# Patient Record
Sex: Female | Born: 1950 | Race: White | Hispanic: No | Smoking: Former smoker
Health system: Southern US, Community
[De-identification: ages and names within clinical notes are randomized; demographics above are authoritative.]

## PROBLEM LIST (undated history)

## (undated) DIAGNOSIS — R0981 Nasal congestion: Secondary | ICD-10-CM

## (undated) DIAGNOSIS — R000 Tachycardia, unspecified: Secondary | ICD-10-CM

## (undated) DIAGNOSIS — E669 Obesity, unspecified: Secondary | ICD-10-CM

## (undated) DIAGNOSIS — M5126 Other intervertebral disc displacement, lumbar region: Secondary | ICD-10-CM

## (undated) DIAGNOSIS — E785 Hyperlipidemia, unspecified: Secondary | ICD-10-CM

## (undated) DIAGNOSIS — IMO0001 Reserved for inherently not codable concepts without codable children: Secondary | ICD-10-CM

## (undated) DIAGNOSIS — T7840XA Allergy, unspecified, initial encounter: Secondary | ICD-10-CM

## (undated) DIAGNOSIS — T8859XA Other complications of anesthesia, initial encounter: Secondary | ICD-10-CM

## (undated) DIAGNOSIS — H269 Unspecified cataract: Secondary | ICD-10-CM

## (undated) DIAGNOSIS — C50411 Malignant neoplasm of upper-outer quadrant of right female breast: Secondary | ICD-10-CM

## (undated) DIAGNOSIS — J349 Unspecified disorder of nose and nasal sinuses: Secondary | ICD-10-CM

## (undated) DIAGNOSIS — K5900 Constipation, unspecified: Secondary | ICD-10-CM

## (undated) DIAGNOSIS — M1990 Unspecified osteoarthritis, unspecified site: Secondary | ICD-10-CM

## (undated) DIAGNOSIS — G4730 Sleep apnea, unspecified: Secondary | ICD-10-CM

## (undated) DIAGNOSIS — T4145XA Adverse effect of unspecified anesthetic, initial encounter: Secondary | ICD-10-CM

## (undated) DIAGNOSIS — R7303 Prediabetes: Secondary | ICD-10-CM

## (undated) DIAGNOSIS — M810 Age-related osteoporosis without current pathological fracture: Secondary | ICD-10-CM

## (undated) DIAGNOSIS — E0500 Thyrotoxicosis with diffuse goiter without thyrotoxic crisis or storm: Secondary | ICD-10-CM

## (undated) DIAGNOSIS — K635 Polyp of colon: Secondary | ICD-10-CM

## (undated) HISTORY — DX: Hyperlipidemia, unspecified: E78.5

## (undated) HISTORY — DX: Polyp of colon: K63.5

## (undated) HISTORY — DX: Morbid (severe) obesity due to excess calories: E66.01

## (undated) HISTORY — DX: Prediabetes: R73.03

## (undated) HISTORY — DX: Unspecified osteoarthritis, unspecified site: M19.90

## (undated) HISTORY — DX: Sleep apnea, unspecified: G47.30

## (undated) HISTORY — DX: Obesity, unspecified: E66.9

## (undated) HISTORY — DX: Nasal congestion: R09.81

## (undated) HISTORY — PX: BREAST LUMPECTOMY: SHX2

## (undated) HISTORY — DX: Malignant neoplasm of upper-outer quadrant of right female breast: C50.411

## (undated) HISTORY — PX: BREAST BIOPSY: SHX20

## (undated) HISTORY — DX: Allergy, unspecified, initial encounter: T78.40XA

## (undated) HISTORY — DX: Age-related osteoporosis without current pathological fracture: M81.0

---

## 1982-12-14 HISTORY — PX: NASAL SEPTUM SURGERY: SHX37

## 1988-12-14 HISTORY — PX: ABDOMINAL HYSTERECTOMY: SHX81

## 1999-06-29 ENCOUNTER — Emergency Department (HOSPITAL_COMMUNITY): Admission: EM | Admit: 1999-06-29 | Discharge: 1999-06-29 | Payer: Self-pay

## 2000-01-02 ENCOUNTER — Other Ambulatory Visit: Admission: RE | Admit: 2000-01-02 | Discharge: 2000-01-02 | Payer: Self-pay | Admitting: Internal Medicine

## 2000-01-02 ENCOUNTER — Encounter (INDEPENDENT_AMBULATORY_CARE_PROVIDER_SITE_OTHER): Payer: Self-pay | Admitting: Specialist

## 2000-08-30 ENCOUNTER — Encounter: Payer: Self-pay | Admitting: Internal Medicine

## 2000-08-30 ENCOUNTER — Ambulatory Visit (HOSPITAL_COMMUNITY): Admission: RE | Admit: 2000-08-30 | Discharge: 2000-08-30 | Payer: Self-pay | Admitting: Internal Medicine

## 2000-11-02 ENCOUNTER — Other Ambulatory Visit: Admission: RE | Admit: 2000-11-02 | Discharge: 2000-11-02 | Payer: Self-pay | Admitting: Obstetrics & Gynecology

## 2001-11-22 ENCOUNTER — Other Ambulatory Visit: Admission: RE | Admit: 2001-11-22 | Discharge: 2001-11-22 | Payer: Self-pay | Admitting: Obstetrics & Gynecology

## 2002-03-17 ENCOUNTER — Encounter: Admission: RE | Admit: 2002-03-17 | Discharge: 2002-03-17 | Payer: Self-pay | Admitting: Oral Surgery

## 2002-03-17 ENCOUNTER — Encounter: Payer: Self-pay | Admitting: Specialist

## 2002-04-07 ENCOUNTER — Emergency Department (HOSPITAL_COMMUNITY): Admission: EM | Admit: 2002-04-07 | Discharge: 2002-04-07 | Payer: Self-pay | Admitting: Emergency Medicine

## 2002-07-27 ENCOUNTER — Encounter: Admission: RE | Admit: 2002-07-27 | Discharge: 2002-10-25 | Payer: Self-pay

## 2002-08-24 ENCOUNTER — Encounter: Payer: Self-pay | Admitting: Internal Medicine

## 2002-12-19 ENCOUNTER — Other Ambulatory Visit: Admission: RE | Admit: 2002-12-19 | Discharge: 2002-12-19 | Payer: Self-pay | Admitting: Obstetrics & Gynecology

## 2005-07-03 ENCOUNTER — Ambulatory Visit: Payer: Self-pay | Admitting: Internal Medicine

## 2005-10-02 ENCOUNTER — Other Ambulatory Visit: Admission: RE | Admit: 2005-10-02 | Discharge: 2005-10-02 | Payer: Self-pay | Admitting: Obstetrics & Gynecology

## 2005-10-28 ENCOUNTER — Encounter: Admission: RE | Admit: 2005-10-28 | Discharge: 2005-10-28 | Payer: Self-pay | Admitting: Obstetrics & Gynecology

## 2007-01-20 ENCOUNTER — Ambulatory Visit: Payer: Self-pay | Admitting: Internal Medicine

## 2007-01-25 ENCOUNTER — Ambulatory Visit (HOSPITAL_COMMUNITY): Admission: RE | Admit: 2007-01-25 | Discharge: 2007-01-25 | Payer: Self-pay | Admitting: Internal Medicine

## 2007-01-25 ENCOUNTER — Encounter: Payer: Self-pay | Admitting: Internal Medicine

## 2007-09-01 ENCOUNTER — Encounter (INDEPENDENT_AMBULATORY_CARE_PROVIDER_SITE_OTHER): Payer: Self-pay | Admitting: *Deleted

## 2007-09-01 ENCOUNTER — Encounter (INDEPENDENT_AMBULATORY_CARE_PROVIDER_SITE_OTHER): Payer: Self-pay | Admitting: Obstetrics & Gynecology

## 2007-09-01 ENCOUNTER — Ambulatory Visit (HOSPITAL_COMMUNITY): Admission: RE | Admit: 2007-09-01 | Discharge: 2007-09-02 | Payer: Self-pay | Admitting: Specialist

## 2007-09-01 HISTORY — PX: RECTOCELE REPAIR: SHX761

## 2007-09-02 ENCOUNTER — Encounter (INDEPENDENT_AMBULATORY_CARE_PROVIDER_SITE_OTHER): Payer: Self-pay | Admitting: *Deleted

## 2009-01-20 ENCOUNTER — Emergency Department (HOSPITAL_BASED_OUTPATIENT_CLINIC_OR_DEPARTMENT_OTHER): Admission: EM | Admit: 2009-01-20 | Discharge: 2009-01-20 | Payer: Self-pay | Admitting: Emergency Medicine

## 2009-01-31 ENCOUNTER — Ambulatory Visit: Payer: Self-pay | Admitting: Diagnostic Radiology

## 2009-01-31 ENCOUNTER — Ambulatory Visit (HOSPITAL_BASED_OUTPATIENT_CLINIC_OR_DEPARTMENT_OTHER): Admission: RE | Admit: 2009-01-31 | Discharge: 2009-01-31 | Payer: Self-pay | Admitting: Orthopedic Surgery

## 2009-08-02 ENCOUNTER — Encounter (INDEPENDENT_AMBULATORY_CARE_PROVIDER_SITE_OTHER): Payer: Self-pay | Admitting: *Deleted

## 2009-08-30 DIAGNOSIS — K219 Gastro-esophageal reflux disease without esophagitis: Secondary | ICD-10-CM

## 2009-10-29 DIAGNOSIS — Z860100 Personal history of colon polyps, unspecified: Secondary | ICD-10-CM | POA: Insufficient documentation

## 2009-10-29 DIAGNOSIS — E785 Hyperlipidemia, unspecified: Secondary | ICD-10-CM

## 2009-10-29 DIAGNOSIS — K589 Irritable bowel syndrome without diarrhea: Secondary | ICD-10-CM

## 2009-10-29 DIAGNOSIS — K5909 Other constipation: Secondary | ICD-10-CM

## 2009-10-29 DIAGNOSIS — K649 Unspecified hemorrhoids: Secondary | ICD-10-CM | POA: Insufficient documentation

## 2009-12-16 ENCOUNTER — Telehealth: Payer: Self-pay | Admitting: Internal Medicine

## 2009-12-16 ENCOUNTER — Ambulatory Visit: Payer: Self-pay | Admitting: Internal Medicine

## 2009-12-17 ENCOUNTER — Ambulatory Visit: Payer: Self-pay | Admitting: Internal Medicine

## 2009-12-17 LAB — HM COLONOSCOPY

## 2009-12-19 ENCOUNTER — Encounter: Payer: Self-pay | Admitting: Internal Medicine

## 2010-01-14 LAB — HM MAMMOGRAPHY: HM Mammogram: NORMAL

## 2010-11-20 ENCOUNTER — Encounter: Payer: Self-pay | Admitting: Internal Medicine

## 2010-11-20 ENCOUNTER — Ambulatory Visit: Payer: Self-pay | Admitting: Internal Medicine

## 2010-11-20 DIAGNOSIS — R609 Edema, unspecified: Secondary | ICD-10-CM

## 2010-11-20 DIAGNOSIS — I739 Peripheral vascular disease, unspecified: Secondary | ICD-10-CM | POA: Insufficient documentation

## 2010-11-20 DIAGNOSIS — R0989 Other specified symptoms and signs involving the circulatory and respiratory systems: Secondary | ICD-10-CM

## 2010-11-20 DIAGNOSIS — IMO0001 Reserved for inherently not codable concepts without codable children: Secondary | ICD-10-CM

## 2010-11-20 DIAGNOSIS — R0609 Other forms of dyspnea: Secondary | ICD-10-CM

## 2010-11-20 DIAGNOSIS — F17200 Nicotine dependence, unspecified, uncomplicated: Secondary | ICD-10-CM

## 2010-11-20 DIAGNOSIS — R209 Unspecified disturbances of skin sensation: Secondary | ICD-10-CM | POA: Insufficient documentation

## 2010-11-20 DIAGNOSIS — R9431 Abnormal electrocardiogram [ECG] [EKG]: Secondary | ICD-10-CM

## 2010-11-20 LAB — CONVERTED CEMR LAB
ALT: 19 units/L (ref 0–35)
AST: 20 units/L (ref 0–37)
Albumin: 4 g/dL (ref 3.5–5.2)
Alkaline Phosphatase: 71 units/L (ref 39–117)
Basophils Absolute: 0 10*3/uL (ref 0.0–0.1)
Bilirubin, Direct: 0.1 mg/dL (ref 0.0–0.3)
CO2: 30 meq/L (ref 19–32)
Calcium: 9.7 mg/dL (ref 8.4–10.5)
Chloride: 102 meq/L (ref 96–112)
Cholesterol: 211 mg/dL — ABNORMAL HIGH (ref 0–200)
Direct LDL: 131.6 mg/dL
Eosinophils Absolute: 0.2 10*3/uL (ref 0.0–0.7)
Glucose, Bld: 97 mg/dL (ref 70–99)
Hgb A1c MFr Bld: 6 % (ref 4.6–6.5)
Lymphocytes Relative: 31.8 % (ref 12.0–46.0)
MCHC: 34 g/dL (ref 30.0–36.0)
MCV: 92 fL (ref 78.0–100.0)
Monocytes Absolute: 0.4 10*3/uL (ref 0.1–1.0)
Neutrophils Relative %: 59.4 % (ref 43.0–77.0)
Platelets: 200 10*3/uL (ref 150.0–400.0)
Potassium: 5.1 meq/L (ref 3.5–5.1)
RDW: 14.8 % — ABNORMAL HIGH (ref 11.5–14.6)
Sed Rate: 22 mm/hr (ref 0–22)
Sodium: 139 meq/L (ref 135–145)
Total CHOL/HDL Ratio: 5
Total Protein: 7.1 g/dL (ref 6.0–8.3)
VLDL: 39.6 mg/dL (ref 0.0–40.0)
Vitamin B-12: 273 pg/mL (ref 211–911)

## 2010-11-21 ENCOUNTER — Encounter: Payer: Self-pay | Admitting: Internal Medicine

## 2010-11-25 ENCOUNTER — Ambulatory Visit: Payer: Self-pay | Admitting: Internal Medicine

## 2010-12-03 ENCOUNTER — Encounter: Payer: Self-pay | Admitting: Internal Medicine

## 2010-12-03 ENCOUNTER — Ambulatory Visit: Payer: Self-pay | Admitting: Cardiology

## 2010-12-05 ENCOUNTER — Ambulatory Visit: Payer: Self-pay

## 2010-12-11 ENCOUNTER — Ambulatory Visit: Payer: Self-pay | Admitting: Pulmonary Disease

## 2010-12-12 ENCOUNTER — Encounter: Payer: Self-pay | Admitting: Internal Medicine

## 2010-12-17 ENCOUNTER — Other Ambulatory Visit: Payer: Self-pay | Admitting: Cardiology

## 2010-12-17 ENCOUNTER — Ambulatory Visit (HOSPITAL_COMMUNITY)
Admission: RE | Admit: 2010-12-17 | Discharge: 2010-12-17 | Payer: Self-pay | Source: Home / Self Care | Attending: Cardiology | Admitting: Cardiology

## 2010-12-17 ENCOUNTER — Encounter: Payer: Self-pay | Admitting: Internal Medicine

## 2010-12-22 ENCOUNTER — Encounter: Payer: Self-pay | Admitting: Cardiology

## 2010-12-25 ENCOUNTER — Ambulatory Visit: Admit: 2010-12-25 | Payer: Self-pay | Admitting: Internal Medicine

## 2010-12-26 ENCOUNTER — Ambulatory Visit (HOSPITAL_COMMUNITY)
Admission: RE | Admit: 2010-12-26 | Discharge: 2010-12-26 | Payer: Self-pay | Source: Home / Self Care | Attending: Cardiology | Admitting: Cardiology

## 2011-01-02 ENCOUNTER — Ambulatory Visit
Admission: RE | Admit: 2011-01-02 | Discharge: 2011-01-02 | Payer: Self-pay | Source: Home / Self Care | Attending: Internal Medicine | Admitting: Internal Medicine

## 2011-01-02 DIAGNOSIS — E538 Deficiency of other specified B group vitamins: Secondary | ICD-10-CM | POA: Insufficient documentation

## 2011-01-02 DIAGNOSIS — M545 Low back pain: Secondary | ICD-10-CM | POA: Insufficient documentation

## 2011-01-07 ENCOUNTER — Ambulatory Visit: Admit: 2011-01-07 | Payer: Self-pay | Admitting: Cardiology

## 2011-01-13 NOTE — Letter (Signed)
Summary: Lipid Letter  [facility]  [address] [place], [place] [address]   Phone: [phone]  Fax: [phone]    11/21/2010  Sophia Kelley [address] [place], [place]  [address]  Dear Jasmine December:  We have carefully reviewed your last lipid profile from  and the results are noted below with a summary of recommendations for lipid management.    Cholesterol:       211     Goal: <200   HDL "good" Cholesterol:   00.86     Goal: >50   LDL "bad" Cholesterol:   132     Goal: <130   Triglycerides:       198.0     Goal: <150        TLC Diet (Therapeutic Lifestyle Change): Saturated Fats & Transfatty acids should be kept < 7% of total calories ***Reduce Saturated Fats Polyunstaurated Fat can be up to 10% of total calories Monounsaturated Fat Fat can be up to 20% of total calories Total Fat should be no greater than 25-35% of total calories Carbohydrates should be 50-60% of total calories Protein should be approximately 15% of total calories Fiber should be at least 20-30 grams a day ***Increased fiber may help lower LDL Total Cholesterol should be < 200mg /day Consider adding plant stanol/sterols to diet (example: Benacol spread) ***A higher intake of unsaturated fat may reduce Triglycerides and Increase HDL    Adjunctive Measures (may lower LIPIDS and reduce risk of Heart Attack) include: Aerobic Exercise (20-30 minutes 3-4 times a week) Limit Alcohol Consumption Weight Reduction Aspirin 75-81 mg a day by mouth (if not allergic or contraindicated) Dietary Fiber 20-30 grams a day by mouth     Current Medications:  None If you have any questions, please call. We appreciate being able to work with you.   Sincerely,    [facility] Etta Grandchild MD

## 2011-01-13 NOTE — Letter (Signed)
Summary: Patient Notice- Colon Biospy Results  [facility]  [address] [place], [place] [address]   Phone: [phone]  Fax: [phone]        December 19, 2009 MRN: [id number]    Sophia Kelley [address] [place], [place]  [address]    Dear [name],  I am pleased to inform you that the biopsies taken during your recent colonoscopy did not show any evidence of cancer upon pathologic examination.The polyp is hyperplastic ( not precancerous)  Additional information/recommendations:  x__No further action is needed at this time.  Please follow-up with      your primary care physician for your other healthcare needs.  __Please call [phone] to schedule a return visit to review      your condition.  __Continue with the treatment plan as outlined on the day of your      exam.  _x_You should have a repeat colonoscopy examination for this problem           in 7_ years.  Please call us if you are having persistent problems or have questions about your condition that have not been fully answered at this time.  Sincerely,  Hart Carwin MD   This letter has been electronically signed by your physician.  Appended Document: Patient Notice- Colon Biospy Results Letter mailed 01.12.11

## 2011-01-13 NOTE — Assessment & Plan Note (Signed)
Summary: rectal bleeding, constipation....em   History of Present Illness Visit Type: follow up  Primary GI MD: Lina Sar MD Primary Provider: Freddy Finner, MD  Requesting Provider: n/a Chief Complaint: Constipation and consult colon History of Present Illness:   This is a 60 year old white female with severe constipation of many years duration. She had repair of a rectocele in September 2008 with only temporary improvement. Her last colonoscopy in 2003 showed a hyperplastic polyp. A prior colonoscopy in 2001 showed an adenomatous polyp. Patient continues to have severe constipation. Without mineral oil, her bowel movements occur only once a week. With mineral oil, she has a bowel movement every other day. There has been no rectal bleeding; her Sitzmarks study in 2008 was negative.   GI Review of Systems      Denies abdominal pain, acid reflux, belching, bloating, chest pain, dysphagia with liquids, dysphagia with solids, heartburn, loss of appetite, nausea, vomiting, vomiting blood, weight loss, and  weight gain.      Reports constipation.     Denies anal fissure, black tarry stools, change in bowel habit, diarrhea, diverticulosis, fecal incontinence, heme positive stool, hemorrhoids, irritable bowel syndrome, jaundice, light color stool, liver problems, rectal bleeding, and  rectal pain.    Current Medications (verified): 1)  Mineral Oil  Oil (Mineral Oil) .... 4th of A Cup Every Week  Allergies (verified): No Known Drug Allergies  Past History:  Past Medical History: Reviewed history from 10/29/2009 and no changes required. Current Problems:  HYPERLIPIDEMIA (ICD-272.4) COLONIC POLYPS, ADENOMATOUS, HX OF (ICD-V12.72) IRRITABLE BOWEL SYNDROME (ICD-564.1) CONSTIPATION, CHRONIC (ICD-564.09) HEMORRHOIDS (ICD-455.6) * COLONIC INERTIA    Past Surgical History: Reviewed history from 10/29/2009 and no changes required. C-Section x 2 Nasal Septal  Surgery Hysterectomy  Family History: Family History of Heart Disease: Father Family History of Prostate Cancer: Brother No FH of Colon Cancer:  Social History: Occupation: Unemployed Patient currently smokes: social  Alcohol Use - no Illicit Drug Use - no Daily Caffeine Use: coffee once daily  Smoking Status:  current  Review of Systems  The patient denies allergy/sinus, anemia, anxiety-new, arthritis/joint pain, back pain, blood in urine, breast changes/lumps, change in vision, confusion, cough, coughing up blood, depression-new, fainting, fatigue, fever, headaches-new, hearing problems, heart murmur, heart rhythm changes, itching, menstrual pain, muscle pains/cramps, night sweats, nosebleeds, pregnancy symptoms, shortness of breath, skin rash, sleeping problems, sore throat, swelling of feet/legs, swollen lymph glands, thirst - excessive , urination - excessive , urination changes/pain, urine leakage, vision changes, and voice change.         Pertinent positive and negative review of systems were noted in the above HPI. All other ROS was otherwise negative.   Vital Signs:  Patient profile:   60 year old female Height:      66 inches Weight:      268 pounds BMI:     43.41 BSA:     2.27 Pulse rate:   64 / minute Pulse rhythm:   regular BP sitting:   126 / 80  (left arm) Cuff size:   regular  Vitals Entered By: Ok Anis CMA (December 16, 2009 10:03 AM)  Physical Exam  General:  obese, alert and oriented. Eyes:  PERRLA, no icterus. Mouth:  No deformity or lesions, dentition normal. Neck:  Supple; no masses or thyromegaly. Lungs:  Clear throughout to auscultation. Heart:  Regular rate and rhythm; no murmurs, rubs,  or bruits. Abdomen:  obese abdomen without tenderness, normoactive bowel sounds. Rectal:  large  amount of soft Hemoccult negative stool. Psych:  Alert and cooperative. Normal mood and affect.   Impression & Recommendations:  Problem # 1:  COLONIC POLYPS,  ADENOMATOUS, HX OF (ICD-V12.72)  Patient has a history of an adenomatous polyp in 2001 and a hyperplastic polyp in 2003. She is due for a repeat colonoscopy at this time.  Orders: Colonoscopy (Colon)  Problem # 2:  CONSTIPATION, CHRONIC (ICD-564.09)  Patient has severe constipation due to colonic inertia. She has a regimen of mineral oil every 2-3 days which seems to be working. I encouraged her to continue the same regimen. She can also use milk of magnesia or dulcolax tablets p.r.n. She needs to stay active and continue a high-fiber diet as well.  Orders: Colonoscopy (Colon)  Patient Instructions: 1)  high-fiber diet. 2)  Increased activity. 3)  Scheduled colonoscopy for followup of colon polyps. 4)  Mineral oil 2-3 tablespoons every 2 days, she  may use dulcolax or milk of magnesia as well. 5)  Copy sent to : Dr Konrad Dolores Prescriptions: DULCOLAX 5 MG  TBEC (BISACODYL) Day before procedure take 2 at 3pm and 2 at 8pm.  #4 x 0   Entered by:   Hortense Ramal CMA (AAMA)   Authorized by:   Hart Carwin MD   Signed by:   Hortense Ramal CMA (AAMA) on 12/16/2009   Method used:   Electronically to        [facility] # [phone]* (retail)       [address]       [place], [place]  [address]       Ph: [phone]       Fax: [phone]   RxID:   [id number] REGLAN 10 MG  TABS (METOCLOPRAMIDE HCL) As per prep instructions.  #2 x 0   Entered by:   Hortense Ramal CMA (AAMA)   Authorized by:   Hart Carwin MD   Signed by:   Hortense Ramal CMA (AAMA) on 12/16/2009   Method used:   Electronically to        [facility] # [phone]* (retail)       [address]       [place], [place]  [address]       Ph: [phone]       Fax: [phone]   RxID:   [id number] MIRALAX   POWD (POLYETHYLENE GLYCOL 3350) As per prep  instructions.  #255gm x 0   Entered by:   Hortense Ramal CMA (AAMA)   Authorized by:   Hart Carwin MD   Signed by:    Hortense Ramal CMA (AAMA) on 12/16/2009   Method used:   Electronically to        [facility] # [phone]* (retail)       [address]       [place], [place]  [address]       Ph: [phone]       Fax: [phone]   RxID:   [id number]

## 2011-01-13 NOTE — Assessment & Plan Note (Signed)
Summary: new bcbs pt--stc   Vital Signs:  Patient profile:   60 year old female Menstrual status:  hysterectomy Height:      66 inches Weight:      279 pounds BMI:     45.19 O2 Sat:      96 % on Room air Temp:     97.8 degrees F oral Pulse rate:   88 / minute Pulse rhythm:   regular Resp:     16 per minute BP sitting:   118 / 82  (left arm) Cuff size:   regular  Vitals Entered By: Rock Nephew CMA (November 20, 2010 1:29 PM)  Nutrition Counseling: Patient's BMI is greater than 25 and therefore counseled on weight management options.  O2 Flow:  Room air  Primary Care Provider:  Etta Grandchild MD   History of Present Illness: New to me she complains of severe/worsening pain in her anterior shins and feet for several years that has worsened over the last few months. She feels tingling/burning/weakness/incoordination in her feet as well. Her symptoms occur mostly at rest and the pain resolves when she walks around. She tells me that she has been seeing Dr. Ezzard Standing (ENT) about choking spells and Dr. Jennette Kettle her Gyn told her that she had a circulatory problem.  Preventive Screening-Counseling & Management  Alcohol-Tobacco     Alcohol drinks/day: 0     Alcohol Counseling: not indicated; patient does not drink     Smoking Status: current     Smoking Cessation Counseling: yes     Packs/Day: 0.5     Year Started: 1974     Pack years: 25     Tobacco Counseling: to quit use of tobacco products  Hep-HIV-STD-Contraception     Hepatitis Risk: no risk noted     HIV Risk: no risk noted     STD Risk: no risk noted      Sexual History:  currently monogamous.        Drug Use:  never.        Blood Transfusions:  no.    Current Medications (verified): 1)  None  Allergies (verified): No Known Drug Allergies  Past History:  Past Medical History: Last updated: 10/29/2009 Current Problems:  HYPERLIPIDEMIA (ICD-272.4) COLONIC POLYPS, ADENOMATOUS, HX OF (ICD-V12.72) IRRITABLE  BOWEL SYNDROME (ICD-564.1) CONSTIPATION, CHRONIC (ICD-564.09) HEMORRHOIDS (ICD-455.6) * COLONIC INERTIA    Past Surgical History: Last updated: 10/29/2009 C-Section x 2 Nasal Septal Surgery Hysterectomy  Family History: Last updated: 12/16/2009 Family History of Heart Disease: Father Family History of Prostate Cancer: Brother No FH of Colon Cancer:  Social History: Last updated: 11/20/2010 Occupation: Unemployed Patient currently smokes: social  Alcohol Use - no Illicit Drug Use - no Daily Caffeine Use: coffee once daily  Married  Family History: Reviewed history from 12/16/2009 and no changes required. Family History of Heart Disease: Father Family History of Prostate Cancer: Brother No FH of Colon Cancer:  Social History: Reviewed history from 12/16/2009 and no changes required. Occupation: Unemployed Patient currently smokes: social  Alcohol Use - no Illicit Drug Use - no Daily Caffeine Use: coffee once daily  Married Packs/Day:  0.5 Hepatitis Risk:  no risk noted HIV Risk:  no risk noted STD Risk:  no risk noted Sexual History:  currently monogamous Drug Use:  never Blood Transfusions:  no  Review of Systems       The patient complains of weight gain, dyspnea on exertion, peripheral edema, prolonged cough, muscle weakness, and difficulty  walking.  The patient denies anorexia, fever, weight loss, chest pain, syncope, headaches, hemoptysis, abdominal pain, melena, hematochezia, severe indigestion/heartburn, hematuria, incontinence, suspicious skin lesions, abnormal bleeding, enlarged lymph nodes, and breast masses.   CV:  Complains of difficulty breathing at night, difficulty breathing while lying down, swelling of feet, and weight gain; denies chest pain or discomfort, fainting, fatigue, leg cramps with exertion, lightheadness, near fainting, palpitations, shortness of breath with exertion, and swelling of hands. Resp:  Complains of cough, excessive snoring,  hypersomnolence, and morning headaches; denies chest discomfort, chest pain with inspiration, coughing up blood, pleuritic, shortness of breath, sputum productive, and wheezing. GI:  Complains of constipation; denies abdominal pain, bloody stools, dark tarry stools, diarrhea, hemorrhoids, indigestion, loss of appetite, nausea, vomiting, vomiting blood, and yellowish skin color. MS:  Complains of loss of strength, muscle aches, and muscle weakness; denies joint pain, joint redness, joint swelling, low back pain, mid back pain, cramps, stiffness, and thoracic pain. Psych:  Complains of anxiety and easily tearful; denies alternate hallucination ( auditory/visual), depression, easily angered, irritability, mental problems, panic attacks, sense of great danger, suicidal thoughts/plans, thoughts of violence, unusual visions or sounds, and thoughts /plans of harming others. Endo:  Complains of weight change; denies cold intolerance, excessive hunger, excessive thirst, excessive urination, heat intolerance, and polyuria.  Physical Exam  General:  alert, well-developed, well-nourished, well-hydrated, and overweight-appearing.   Head:  normocephalic, atraumatic, no abnormalities observed, and no abnormalities palpated.   Eyes:  vision grossly intact, pupils equal, pupils round, and pupils reactive to light.   Ears:  R ear normal and L ear normal.   Mouth:  Oral mucosa and oropharynx without lesions or exudates.  Teeth in good repair. Neck:  supple, full ROM, no masses, no thyromegaly, no thyroid nodules or tenderness, no JVD, no HJR, normal carotid upstroke, no carotid bruits, no cervical lymphadenopathy, and no neck tenderness.   Lungs:  normal respiratory effort, no intercostal retractions, no accessory muscle use, normal breath sounds, no dullness, no fremitus, no crackles, and no wheezes.   Heart:  normal rate, regular rhythm, no murmur, no gallop, no rub, and no JVD.   Abdomen:  soft, non-tender, normal  bowel sounds, no distention, no masses, no guarding, no rigidity, no rebound tenderness, no abdominal hernia, no inguinal hernia, no hepatomegaly, and no splenomegaly.   Msk:  normal ROM, no joint tenderness, no joint swelling, no joint warmth, no redness over joints, no joint deformities, no joint instability, and no crepitation.   Pulses:  R radial normal, L radial normal, R femoral decreased, R popliteal decreased, R posterior tibial decreased, R dorsalis pedis decreased, L femoral decreased, L popliteal decreased, L posterior tibial decreased, and L dorsalis pedis decreased.   Extremities:  trace left pedal edema and trace right pedal edema. she has very faint erythema and diffuse ttp in the bilateral anterior lower leg area. She has good capillary refill in the distal toes.  Neurologic:  alert & oriented X3, cranial nerves II-XII intact, sensation intact to light touch, heel-to-shin normal, toes down bilaterally on Babinski, Romberg negative, ataxic gait, RLE hyperreflexia, RLE weakness, LLE hyperreflexia, and LLE weakness.   Skin:  turgor normal, color normal, no rashes, no suspicious lesions, no ecchymoses, no petechiae, no purpura, no ulcerations, and no edema.   Cervical Nodes:  no anterior cervical adenopathy and no posterior cervical adenopathy.   Axillary Nodes:  no R axillary adenopathy and no L axillary adenopathy.   Inguinal Nodes:  no R inguinal adenopathy.  Psych:  Oriented X3, memory intact for recent and remote, normally interactive, good eye contact, not depressed appearing, not agitated, not suicidal, not homicidal, and moderately anxious.     Impression & Recommendations:  Problem # 1:  ABNORMAL ELECTROCARDIOGRAM (ICD-794.31) Assessment New  Orders: Cardiology Referral (Cardiology) EKG w/ Interpretation (93000)  Problem # 2:  TOBACCO USE (ICD-305.1) Assessment: New  Encouraged smoking cessation and discussed different methods for smoking cessation.   Problem # 3:   PARESTHESIA (ICD-782.0) Assessment: New will check NCS and EMG to look for neuropathy and myopathy Orders: Venipuncture (16109) TLB-Lipid Panel (80061-LIPID) TLB-B12 + Folate Pnl (60454_09811-B14/NWG) Neurology Referral (Neuro)  Problem # 4:  PVD (ICD-443.9) Assessment: New  Orders: Venipuncture (95621) TLB-Lipid Panel (80061-LIPID) TLB-B12 + Folate Pnl (30865_78469-G29/BMW) LE Arterial Doppler/ABI (Le arterial doppler)  Problem # 5:  UNSPECIFIED MYALGIA AND MYOSITIS (ICD-729.1) Assessment: New will screen for inflammatory process Orders: Venipuncture (41324) TLB-BMP (Basic Metabolic Panel-BMET) (80048-METABOL) TLB-Hepatic/Liver Function Pnl (80076-HEPATIC) TLB-TSH (Thyroid Stimulating Hormone) (84443-TSH) TLB-CBC Platelet - w/Differential (85025-CBCD) TLB-CK Total Only(Creatine Kinase/CPK) (82550-CK) TLB-A1C / Hgb A1C (Glycohemoglobin) (83036-A1C) TLB-Sedimentation Rate (ESR) (85652-ESR) TLB-CRP-High Sensitivity (C-Reactive Protein) (86140-FCRP) TLB-BNP (B-Natriuretic Peptide) (83880-BNPR) Venipuncture (40102) TLB-Lipid Panel (80061-LIPID) TLB-B12 + Folate Pnl (72536_64403-K74/QVZ)  Problem # 6:  COUGH (ICD-786.2) Assessment: New will look for edema, masses, etc Orders: Venipuncture (56387) TLB-BMP (Basic Metabolic Panel-BMET) (80048-METABOL) TLB-Hepatic/Liver Function Pnl (80076-HEPATIC) TLB-TSH (Thyroid Stimulating Hormone) (84443-TSH) TLB-CBC Platelet - w/Differential (85025-CBCD) TLB-CK Total Only(Creatine Kinase/CPK) (82550-CK) TLB-A1C / Hgb A1C (Glycohemoglobin) (83036-A1C) TLB-Sedimentation Rate (ESR) (85652-ESR) TLB-CRP-High Sensitivity (C-Reactive Protein) (86140-FCRP) TLB-BNP (B-Natriuretic Peptide) (83880-BNPR) T-2 View CXR (71020TC)  Problem # 7:  DYSPNEA/SHORTNESS OF BREATH (ICD-786.09) Assessment: New I think this is probably a conditioning problem but will look further into cardiac and pulmonary issues Orders: Venipuncture (56433) TLB-BMP  (Basic Metabolic Panel-BMET) (80048-METABOL) TLB-Hepatic/Liver Function Pnl (80076-HEPATIC) TLB-TSH (Thyroid Stimulating Hormone) (84443-TSH) TLB-CBC Platelet - w/Differential (85025-CBCD) TLB-CK Total Only(Creatine Kinase/CPK) (82550-CK) TLB-A1C / Hgb A1C (Glycohemoglobin) (83036-A1C) TLB-Sedimentation Rate (ESR) (85652-ESR) TLB-CRP-High Sensitivity (C-Reactive Protein) (86140-FCRP) TLB-BNP (B-Natriuretic Peptide) (83880-BNPR) EKG w/ Interpretation (93000)  Problem # 8:  SLEEP APNEA (ICD-780.57) Assessment: New  Orders: Venipuncture (29518) TLB-BMP (Basic Metabolic Panel-BMET) (80048-METABOL) TLB-Hepatic/Liver Function Pnl (80076-HEPATIC) TLB-TSH (Thyroid Stimulating Hormone) (84443-TSH) TLB-CBC Platelet - w/Differential (85025-CBCD) TLB-CK Total Only(Creatine Kinase/CPK) (82550-CK) TLB-A1C / Hgb A1C (Glycohemoglobin) (83036-A1C) TLB-Sedimentation Rate (ESR) (85652-ESR) TLB-CRP-High Sensitivity (C-Reactive Protein) (86140-FCRP) TLB-BNP (B-Natriuretic Peptide) (83880-BNPR) Sleep Disorder Referral (Sleep Disorder)  Problem # 9:  HYPERLIPIDEMIA (ICD-272.4) Assessment: Unchanged  Patient Instructions: 1)  Please schedule a follow-up appointment in 1 month. 2)  Tobacco is very bad for your health and your loved ones! You Should stop smoking!. 3)  Stop Smoking Tips: Choose a Quit date. Cut down before the Quit date. decide what you will do as a substitute when you feel the urge to smoke(gum,toothpick,exercise). 4)  It is important that you exercise regularly at least 20 minutes 5 times a week. If you develop chest pain, have severe difficulty breathing, or feel very tired , stop exercising immediately and seek medical attention. 5)  You need to lose weight. Consider a lower calorie diet and regular exercise.    Orders Added: 1)  Venipuncture [36415] 2)  TLB-BMP (Basic Metabolic Panel-BMET) [80048-METABOL] 3)  TLB-Hepatic/Liver Function Pnl [80076-HEPATIC] 4)  TLB-TSH (Thyroid  Stimulating Hormone) [84443-TSH] 5)  TLB-CBC Platelet - w/Differential [85025-CBCD] 6)  TLB-CK Total Only(Creatine Kinase/CPK) [82550-CK] 7)  TLB-A1C / Hgb A1C (Glycohemoglobin) [83036-A1C] 8)  TLB-Sedimentation Rate (ESR) [85652-ESR] 9)  TLB-CRP-High Sensitivity (  C-Reactive Protein) [86140-FCRP] 10)  TLB-BNP (B-Natriuretic Peptide) [83880-BNPR] 11)  T-2 View CXR [71020TC] 12)  Venipuncture [36415] 13)  TLB-Lipid Panel [80061-LIPID] 14)  TLB-B12 + Folate Pnl [82746_82607-B12/FOL] 15)  LE Arterial Doppler/ABI [Le arterial doppler] 16)  Neurology Referral [Neuro] 17)  Sleep Disorder Referral [Sleep Disorder] 18)  Cardiology Referral [Cardiology] 19)  EKG w/ Interpretation [93000] 20)  New Patient Level V [99205]    Preventive Care Screening  Mammogram:    Date:  01/14/2010    Results:  normal   Pap Smear:    Date:  01/14/2010    Results:  normal

## 2011-01-13 NOTE — Letter (Signed)
Summary: [facility] Instructions  [facility]  [address] [place], [place] [address]   Phone: [phone]  Fax: [phone]       Sophia Kelley    10-31-1951    MRN: [id number]       Procedure Day /Date: 12/17/09 (Tuesday)     Arrival Time: 8:30 am     Procedure Time: 9:30 am     Location of Procedure:                    _ x_  [facility] (4th Floor)  PREPARATION FOR COLONOSCOPY WITH MIRALAX   THE DAY BEFORE YOUR PROCEDURE         DATE: 12/16/09 DAY: Monday  1   Drink clear liquids the entire day-NO SOLID FOOD  2   Do not drink anything colored red or purple.  Avoid juices with pulp.  No orange juice.  3   Drink at least 64 oz. (8 glasses) of fluid/clear liquids during the day to prevent dehydration and help the prep work efficiently.  CLEAR LIQUIDS INCLUDE: Water Jello Ice Popsicles Tea (sugar ok, no milk/cream) Powdered fruit flavored drinks Coffee (sugar ok, no milk/cream) Gatorade Juice: apple, white grape, white cranberry  Lemonade Clear bullion, consomm, broth Carbonated beverages (any kind) Strained chicken noodle soup Hard Candy  4   Mix the entire bottle of Miralax with 64 oz. of Gatorade/Powerade in the morning and put in the refrigerator to chill.  5   At 3:00 pm take 2 Dulcolax/Bisacodyl tablets.  6   At 4:30 pm take one Reglan/Metoclopramide tablet.  7  Starting at 5:00 pm drink one 8 oz glass of the Miralax mixture every 15-20 minutes until you have finished drinking the entire 64 oz.  You should finish drinking prep around 7:30 or 8:00 pm.  8   If you are nauseated, you may take the 2nd Reglan/Metoclopramide tablet at 6:30 pm.        9    At 8:00 pm take 2 more DULCOLAX/Bisacodyl tablets.            THE DAY OF YOUR PROCEDURE      DATE:  12/17/09 DAY: Tuesday  You may drink clear liquids until 7:30 am  (2 HOURS BEFORE PROCEDURE).   MEDICATION INSTRUCTIONS  Unless otherwise instructed, you should take regular  prescription medications with a small sip of water as early as possible the morning of your procedure.         OTHER INSTRUCTIONS  You will need a responsible adult at least 60 years of age to accompany you and drive you home.   This person must remain in the waiting room during your procedure.  Wear loose fitting clothing that is easily removed.  Leave jewelry and other valuables at home.  However, you may wish to bring a book to read or an iPod/MP3 player to listen to music as you wait for your procedure to start.  Remove all body piercing jewelry and leave at home.  Total time from sign-in until discharge is approximately 2-3 hours.  You should go home directly after your procedure and rest.  You can resume normal activities the day after your procedure.  The day of your procedure you should not:   Drive   Make legal decisions   Operate machinery   Drink alcohol   Return to work  You will receive specific instructions about eating, activities and medications before you leave.   The  above instructions have been reviewed and explained to me by   Hortense Ramal, CMA    I fully understand and can verbalize these instructions _____________________________ Date 12/16/09

## 2011-01-13 NOTE — Procedures (Signed)
Summary: Colonoscopy  Patient: [name] Note: All result statuses are Final unless otherwise noted.  Tests: (1) Colonoscopy (COL)   COL Colonoscopy           DONE     [facility]     [address]     [place], [place]  [address]           COLONOSCOPY PROCEDURE REPORT           PATIENT:  Sophia Kelley, Sophia Kelley  MR#:  [id number]     BIRTHDATE:  [birth year], 58 yrs. old  GENDER:  female           ENDOSCOPIST:  Hedwig Morton. Juanda Chance, MD     Referred by:  Varney Baas, M.D.           PROCEDURE DATE:  12/17/2009     PROCEDURE:  Colonoscopy 19147     ASA CLASS:  Class II     INDICATIONS:  constipation adenom. polyp 2001,     hyperplastic polyp in 2003           Sitz mark study negative           MEDICATIONS:   Versed 8 mg, Fentanyl 75 mcg           DESCRIPTION OF PROCEDURE:   After the risks benefits and     alternatives of the procedure were thoroughly explained, informed     consent was obtained.  Digital rectal exam was performed and     revealed no rectal masses.   The LB [id number] endoscope     was introduced through the anus and advanced to the cecum, which     was identified by the ileocecal valve, unable to vis appendiceal     opening  The quality of the prep was good, using MiraLax.  The     instrument was then slowly withdrawn as the colon was fully     examined.     <<PROCEDUREIMAGES>>           FINDINGS:  There were multiple polyps identified and removed. in     the sigmoid colon. about 7-8 diminutive polyps ablated with hot     biopsies at 20cm With hot biopsy forceps, biopsy was obtained and     sent to pathology (see image3, image4, and image5).  This was     otherwise a normal examination of the colon (see image6 and     image2).   Retroflexed views in the rectum revealed no     abnormalities.    The scope was then withdrawn from the patient     and the procedure completed.           COMPLICATIONS:  None           ENDOSCOPIC IMPRESSION:     1)  Polyps, multiple in the sigmoid colon     2) Otherwise normal examination     s/p multiple hot biopsy polypectomies at 20 cm     RECOMMENDATIONS:     1) Await pathology results     2) high fiber diet     Mineral oil 2-3 tablespoons daily           REPEAT EXAM:  In 7 year(s) for.           ______________________________     Hedwig Morton. Juanda Chance, MD           CC:  n.     eSIGNED:   Hedwig Morton. Danyele Smejkal at 12/17/2009 10:25 AM           [name], [phone]  Note: An exclamation mark (!) indicates a result that was not dispersed into the flowsheet. Document Creation Date: 12/17/2009 10:24 AM _______________________________________________________________________  (1) Order result status: Final Collection or observation date-time: 12/17/2009 10:14 Requested date-time:  Receipt date-time:  Reported date-time:  Referring Physician:   Ordering Physician: Lina Sar [phone]) Specimen Source:  Source: Launa Grill Order Number: [id number] Lab site:   Appended Document: Colonoscopy     Procedures Next Due Date:    Colonoscopy: 12/2016

## 2011-01-13 NOTE — Progress Notes (Signed)
Summary: vomiting after colonoscopy prep  Phone Note Call from Patient   Caller: Spouse Complaint: Headache Summary of Call: She had vomiting several times after finishing MiraLax prep. Has had bowel movements. No abdominal pain, no chest pain or respiratory difficulty. she does feel weak and was light-headed. advised to take sips of liquids to hydrate and call back or go to ED if syncope, persistent vomiting, other problems. Husband told that she may need additional cleansing with enemas in AM and that if stools not clear to arrive early at [facility] and explain to staff. Initial call taken by: Iva Boop MD, Clementeen Graham,  December 16, 2009 8:54 PM

## 2011-01-14 ENCOUNTER — Other Ambulatory Visit: Payer: Self-pay

## 2011-01-14 ENCOUNTER — Ambulatory Visit: Admit: 2011-01-14 | Payer: Self-pay | Admitting: Cardiology

## 2011-01-15 NOTE — Assessment & Plan Note (Signed)
Summary: f/u appt / #/ cd   Vital Signs:  Patient profile:   60 year old female Menstrual status:  hysterectomy Height:      66 inches Weight:      282 pounds BMI:     45.68 O2 Sat:      98 % on Room air Temp:     98.4 degrees F oral Pulse rate:   80 / minute Pulse rhythm:   regular Resp:     16 per minute BP sitting:   110 / 84  (left arm) Cuff size:   large  Vitals Entered By: Lanier Prude, CMA(AAMA) (January 02, 2011 1:12 PM)  Nutrition Counseling: Patient's BMI is greater than 25 and therefore counseled on weight management options.  O2 Flow:  Room air CC: f/u  Is Patient Diabetic? No   Primary Care Provider:  Etta Grandchild MD  CC:  f/u .  History of Present Illness: She returns for f/up and tells me that Dr. Anne Hahn of neurology did her NCS and EMG and it was normal but she told him her symptoms and that she has had some LBP and he told her that he thought the leg pain was coming from her low back. She has tingling in her legs and thinks that she has RLS and she tells me that she will have a sleep study done soon. She tells me that her legs feel twitchy. Her ABI's are normal but her leg and back pain is worsened by activity, she bends over to relieve her low back pain. Her TEE showed some lipomatous changes in the atrium and concentric LVH. Her labs revealed a borderline low B12 level.  Preventive Screening-Counseling & Management  Alcohol-Tobacco     Alcohol drinks/day: 0     Alcohol Counseling: not indicated; patient does not drink     Smoking Status: current     Smoking Cessation Counseling: yes     Packs/Day: 0.5     Year Started: 1974     Pack years: 25     Tobacco Counseling: to quit use of tobacco products  Hep-HIV-STD-Contraception     Hepatitis Risk: no risk noted     HIV Risk: no risk noted     STD Risk: no risk noted      Sexual History:  currently monogamous.        Drug Use:  never.        Blood Transfusions:  no.    Clinical Review  Panels:  Prevention   Last Mammogram:  normal (01/14/2010)   Last Pap Smear:  normal (01/14/2010)   Last Colonoscopy:  DONE (12/17/2009)  Immunizations   Last Flu Vaccine:  Fluvax 3+ (11/24/2010)  Lipid Management   Cholesterol:  211 (11/20/2010)   HDL (good cholesterol):  45.90 (11/20/2010)  Diabetes Management   HgBA1C:  6.0 (11/20/2010)   Creatinine:  0.8 (11/20/2010)   Last Flu Vaccine:  Fluvax 3+ (11/24/2010)  CBC   WBC:  7.6 (11/20/2010)   RBC:  4.81 (11/20/2010)   Hgb:  15.1 (11/20/2010)   Hct:  44.2 (11/20/2010)   Platelets:  200.0 (11/20/2010)   MCV  92.0 (11/20/2010)   MCHC  34.0 (11/20/2010)   RDW  14.8 (11/20/2010)   PMN:  59.4 (11/20/2010)   Lymphs:  31.8 (11/20/2010)   Monos:  5.8 (11/20/2010)   Eosinophils:  2.5 (11/20/2010)   Basophil:  0.5 (11/20/2010)  Complete Metabolic Panel   Glucose:  97 (11/20/2010)   Sodium:  139 (  11/20/2010)   Potassium:  5.1 (11/20/2010)   Chloride:  102 (11/20/2010)   CO2:  30 (11/20/2010)   BUN:  10 (11/20/2010)   Creatinine:  0.8 (11/20/2010)   Albumin:  4.0 (11/20/2010)   Total Protein:  7.1 (11/20/2010)   Calcium:  9.7 (11/20/2010)   Total Bili:  0.4 (11/20/2010)   Alk Phos:  71 (11/20/2010)   SGPT (ALT):  19 (11/20/2010)   SGOT (AST):  20 (11/20/2010)   Current Medications (verified): 1)  Aspirin 81 Mg Tbec (Aspirin) .... One Daily 2)  Pravachol 40 Mg Tabs (Pravastatin Sodium) .... One Daily in The Evening  Allergies (verified): No Known Drug Allergies  Past History:  Past Medical History: Last updated: 12/03/2010 1. HYPERLIPIDEMIA (ICD-272.4) 2. COLONIC POLYPS, ADENOMATOUS, HX OF (ICD-V12.72) 3. IRRITABLE BOWEL SYNDROME (ICD-564.1) 4. HEMORRHOIDS (ICD-455.6) 5. COLONIC INERTIA 6. Active smoker 7. OSA (suspected) 8. Obesity  Past Surgical History: Last updated: 10/29/2009 C-Section x 2 Nasal Septal Surgery Hysterectomy  Family History: Last updated: 12/03/2010 Family History of Heart  Disease: Father with MI at 39 Family History of Prostate Cancer: Brother No FH of Colon Cancer:  Social History: Last updated: 12/03/2010 Occupation: Unemployed Patient currently smokes: < 1/2 ppd Alcohol Use - no Illicit Drug Use - no Daily Caffeine Use: coffee once daily  Married  Risk Factors: Smoking Status: current (01/02/2011) Packs/Day: 0.5 (01/02/2011)  Family History: Reviewed history from 12/03/2010 and no changes required. Family History of Heart Disease: Father with MI at 12 Family History of Prostate Cancer: Brother No FH of Colon Cancer:  Social History: Reviewed history from 12/03/2010 and no changes required. Occupation: Unemployed Patient currently smokes: < 1/2 ppd Alcohol Use - no Illicit Drug Use - no Daily Caffeine Use: coffee once daily  Married  Review of Systems       The patient complains of weight gain and difficulty walking.  The patient denies anorexia, fever, weight loss, chest pain, syncope, dyspnea on exertion, peripheral edema, prolonged cough, headaches, hemoptysis, abdominal pain, hematuria, muscle weakness, suspicious skin lesions, transient blindness, depression, enlarged lymph nodes, and angioedema.    Physical Exam  General:  alert, well-developed, well-nourished, well-hydrated, and overweight-appearing.   Head:  normocephalic, atraumatic, no abnormalities observed, and no abnormalities palpated.   Mouth:  Oral mucosa and oropharynx without lesions or exudates.  Teeth in good repair. Neck:  supple, full ROM, no masses, no thyromegaly, no thyroid nodules or tenderness, no JVD, no HJR, normal carotid upstroke, no carotid bruits, no cervical lymphadenopathy, and no neck tenderness.   Lungs:  normal respiratory effort, no intercostal retractions, no accessory muscle use, normal breath sounds, no dullness, no fremitus, no crackles, and no wheezes.   Heart:  normal rate, regular rhythm, no murmur, no gallop, no rub, and no JVD.   Abdomen:   soft, non-tender, normal bowel sounds, no distention, no masses, no guarding, no rigidity, no rebound tenderness, no abdominal hernia, no inguinal hernia, no hepatomegaly, and no splenomegaly.   Msk:  normal ROM, no joint tenderness, no joint swelling, no joint warmth, no redness over joints, no joint deformities, no joint instability, and no crepitation.   Extremities:  trace left pedal edema and trace right pedal edema. she has very faint erythema and diffuse ttp in the bilateral anterior lower leg area. She has good capillary refill in the distal toes.  Neurologic:  alert & oriented X3, cranial nerves II-XII intact, sensation intact to light touch, heel-to-shin normal, toes down bilaterally on Babinski, Romberg negative, ataxic  gait, RLE hyperreflexia, RLE weakness, LLE hyperreflexia, and LLE weakness.   Skin:  turgor normal, color normal, no rashes, no suspicious lesions, no ecchymoses, no petechiae, no purpura, no ulcerations, and no edema.   Cervical Nodes:  no anterior cervical adenopathy and no posterior cervical adenopathy.   Psych:  Oriented X3, memory intact for recent and remote, normally interactive, good eye contact, not depressed appearing, not agitated, not suicidal, not homicidal, and moderately anxious.     Impression & Recommendations:  Problem # 1:  VITAMIN B12 DEFICIENCY (ICD-266.2) Assessment New  Orders: Admin of Therapeutic Inj  intramuscular or subcutaneous ([address]) Vit B12 1000 mcg (J3420)  Problem # 2:  LOW BACK PAIN, CHRONIC (ICD-724.2) Assessment: New this sounds like spinal stenosis, will check an MRI for severity and look for HNP, nerve impingement, spinal cord lesions, etc Her updated medication list for this problem includes:    Aspirin 81 Mg Tbec (Aspirin) ..... One daily  Orders: Radiology Referral (Radiology)  Problem # 3:  TOBACCO USE (ICD-305.1) Assessment: Unchanged  Encouraged smoking cessation and discussed different methods for smoking cessation.    Orders: Tobacco use cessation intermediate 3-10 minutes ([address])  Problem # 4:  PARESTHESIA (ICD-782.0) Assessment: Unchanged start B12 replacement and check the MRI Orders: Radiology Referral (Radiology)  Complete Medication List: 1)  Aspirin 81 Mg Tbec (Aspirin) .... One daily 2)  Pravachol 40 Mg Tabs (Pravastatin sodium) .... One daily in the evening  Patient Instructions: 1)  Please schedule a follow-up appointment in 2 weeks for your second B12 injection. 2)  Tobacco is very bad for your health and your loved ones! You Should stop smoking!. 3)  Stop Smoking Tips: Choose a Quit date. Cut down before the Quit date. decide what you will do as a substitute when you feel the urge to smoke(gum,toothpick,exercise). 4)  It is important that you exercise regularly at least 20 minutes 5 times a week. If you develop chest pain, have severe difficulty breathing, or feel very tired , stop exercising immediately and seek medical attention. 5)  You need to lose weight. Consider a lower calorie diet and regular exercise.    Medication Administration  Injection # 1:    Medication: Vit B12 1000 mcg    Diagnosis: VITAMIN B12 DEFICIENCY (ICD-266.2)    Route: IM    Site: L deltoid    Exp Date: 08/14/2012    Lot #: [id number]    Mfr: [employer]    Patient tolerated injection without complications    Given by: Lamar Sprinkles, CMA (January 02, 2011 5:43 PM)  Orders Added: 1)  Radiology Referral [Radiology] 2)  Tobacco use cessation intermediate 3-10 minutes [99406] 3)  Est. Patient Level IV [81191] 4)  Admin of Therapeutic Inj  intramuscular or subcutaneous [96[address]] 5)  Vit B12 1000 mcg [J3420]     Medication Administration  Injection # 1:    Medication: Vit B12 1000 mcg    Diagnosis: VITAMIN B12 DEFICIENCY (ICD-266.2)    Route: IM    Site: L deltoid    Exp Date: 08/14/2012    Lot #: [id number]    Mfr: [employer]    Patient tolerated injection without  complications    Given by: Lamar Sprinkles, CMA (January 02, 2011 5:43 PM)  Orders Added: 1)  Radiology Referral [Radiology] 2)  Tobacco use cessation intermediate 3-10 minutes [99406] 3)  Est. Patient Level IV [21308] 4)  Admin of Therapeutic Inj  intramuscular or subcutaneous [96372] 5)  Vit  B12 1000 mcg [J3420]

## 2011-01-15 NOTE — Letter (Signed)
Summary: TEE Instructions  [facility], Main Office  [address]   [place], [place] [address]   Phone: [phone]  Fax: [phone]      TEE Instructions  12/22/2010 MRN: [id number]  Sophia Kelley [address] [place], [place]  [address]      You are scheduled for a TEE on __january 13, 2012___at  1:00 pm__________________ with Dr. ___mclean_____________________.  Please arrive at the [facility] at ____1200noon____________ a.m. / p.m. on the day of your procedure.  1)   Diet:     A)   Nothing to eat or drink after midnight except your medications with        a sip of water.  2)  Must have a responsible person to drive you home.  3)   Bring your current insurance cards and current list of all your medications.   *Special Note:  Every effort is made to have your procedure done on time.  Occasionally there are emergencies that present themselves at the hospital that may cause delays.  Please be patient if a delay does occur.  *If you have any questions after you get home, please call the office at [phone].

## 2011-01-15 NOTE — Assessment & Plan Note (Signed)
Summary: PER SARAH--FLU VAC--STC  Nurse Visit   Allergies: No Known Drug Allergies  Immunizations Administered:  Influenza Vaccine # 1:    Vaccine Type: Fluvax 3+    Site: left deltoid    Mfr: [employer]    Dose: 0.5 ml    Route: IM    Given by: Lamar Sprinkles, CMA    Exp. Date: 06/13/2011    Lot #: [id number]    VIS given: 07/08/10 version given November 24, 2010.  Orders Added: 1)  Flu Vaccine 23yrs + [90658] 2)  Admin 1st Vaccine [21308]

## 2011-01-15 NOTE — Assessment & Plan Note (Signed)
Summary: np6/abn ekg/jml   Visit Type:  np Referring Provider:  n/a Primary Provider:  Etta Grandchild MD  CC:  shortness of breath, possible CP? pain in both legs, dizziness, and swelling in legs.  History of Present Illness: 60 yo with history of smoking, hyperlipidemia presents for evaluation of leg pain and exertional dyspnea.  Patient gets throbbing pain in her anterior lower legs (area of the shins and not the calves).  There is no definite trigger.  It occurs more often at rest, but she also notes it after walking for about a block.  This has been going on for 2-3 months.  It is getting to the point where it is hard for her to stand due to the pain.  It hurts if she bends her knees.  She additionally gets short of breath after walking about 50-75 feet on flat ground.  This dyspnea is mild.   She also snores loudly and has been told that she quits breathing at night.    ECG: NSR, left atrial enlargement.    Labs (12/11): HDL 46, LDL 132, K 5.1, creatinine 0.8, TSH normal, BNP 51  Current Medications (verified): 1)  Aspirin 81 Mg Tbec (Aspirin) .... 2-3 Times A Week  Allergies (verified): No Known Drug Allergies  Past History:  Past Medical History: 1. HYPERLIPIDEMIA (ICD-272.4) 2. COLONIC POLYPS, ADENOMATOUS, HX OF (ICD-V12.72) 3. IRRITABLE BOWEL SYNDROME (ICD-564.1) 4. HEMORRHOIDS (ICD-455.6) 5. COLONIC INERTIA 6. Active smoker 7. OSA (suspected) 8. Obesity  Family History: Family History of Heart Disease: Father with MI at 33 Family History of Prostate Cancer: Brother No FH of Colon Cancer:  Social History: Occupation: Unemployed Patient currently smokes: < 1/2 ppd Alcohol Use - no Illicit Drug Use - no Daily Caffeine Use: coffee once daily  Married  Review of Systems       All systems reviewed and negative except as per HPI.   Vital Signs:  Patient profile:   60 year old female Menstrual status:  hysterectomy Height:      66 inches Weight:      277.75  pounds BMI:     44.99 Pulse rate:   80 / minute BP sitting:   148 / 97  (left arm) Cuff size:   regular  Vitals Entered By: Caralee Ates CMA (December 03, 2010 12:23 PM)  Physical Exam  General:  Well developed, well nourished, in no acute distress. Obese.  Neck:  Neck supple, no JVD. No masses, thyromegaly or abnormal cervical nodes. Lungs:  Clear bilaterally to auscultation and percussion. Heart:  Non-displaced PMI, chest non-tender; regular rate and rhythm, S1, S2 without murmurs, rubs or gallops. Carotid upstroke normal, no bruit. Trace ankle edema. 2+ DP pulse on right, 1+ DP pulse on left, unable to palpate PT pulses.  Abdomen:  Bowel sounds positive; abdomen soft and non-tender without masses, organomegaly, or hernias noted. No hepatosplenomegaly. Extremities:  No clubbing or cyanosis. Neurologic:  Alert and oriented x 3. Skin:  Intact without lesions or rashes. Psych:  Normal affect.   Impression & Recommendations:  Problem # 1:  ABNORMAL ELECTROCARDIOGRAM (ICD-794.31) Reviewing the old ECG, I do not think that it shows evidence for old anteroseptal MI.  Likely poor anterior R wave progression due to lead position.   Problem # 2:  PVD (ICD-443.9) Patient's leg pain is atypical for claudication, but she does have diminished pedal pulses on exam.  Will get arterial dopplers to look for any significant PAD.   Problem # 3:  TOBACCO USE (ICD-305.1) I strongly counselled the patient to stop smoking.   Problem # 4:  SLEEP APNEA (ICD-780.57) Suspect OSA.  Will arrange sleep study.   Problem # 5:  DYSPNEA/SHORTNESS OF BREATH (ICD-786.09) Mild exertional dyspnea.  No volume overload on exam and BNP not significantly elevated.  Will get echo to assess LV and RV size and function.    Problem # 6:  HYPERLIPIDEMIA (ICD-272.4) Given her family history of premature CAD, I would treat her hyperlipidemia.  I will have her start pravastatin 40 mg daily.   She will also take ASA 81 mg  daily.   Other Orders: Arterial Duplex Lower Extremity (Arterial Duplex Low) Echocardiogram (Echo)  Patient Instructions: 1)  Your physician has recommended you make the following change in your medication:  2)  Start Aspirin 81mg  daily--this should be enteric coated. 3)  Start Pravachol 40mg  daily in the evening. 4)  Fasting lab in 2 months--lipid profile/liver profile 786.09  786.05 5)  Your physician has requested that you have a lower  extremity arterial duplex.  This test is an ultrasound of the arteries in the legs.  It looks at arterial blood flow in the legs.  Allow one hour for Lower  Arterial scans. There are no restrictions or special instructions. 6)  Your physician has requested that you have an echocardiogram.  Echocardiography is a painless test that uses sound waves to create images of your heart. It provides your doctor with information about the size and shape of your heart and how well your heart's chambers and valves are working.  This procedure takes approximately one hour. There are no restrictions for this procedure. 7)  You have an appointment with Dr Craige Cotta Thursday December 29 at 4pm. This is [address] Dr Craige Cotta is a pulmonary doctor. 8)  Appointment in 1 month with Dr Shirlee Latch after you have had testing. Prescriptions: PRAVACHOL 40 MG TABS (PRAVASTATIN SODIUM) one daily in the evening  #30 x 3   Entered by:   Katina Dung, RN, BSN   Authorized by:   Marca Ancona, MD   Signed by:   Katina Dung, RN, BSN on 12/03/2010   Method used:   Electronically to        [facility] # [phone]* (retail)       [address]       [place], [place]  [address]       Ph: [phone]       Fax: [phone]   RxID:   [id number]

## 2011-01-15 NOTE — Miscellaneous (Signed)
Summary: Appointment Canceled  Appointment status changed to canceled by [employer] on 12/03/2010 1:49 PM.  Cancellation Comments --------------------- echo/443.9/bcbs prec. req/saf  Appointment Information ----------------------- Appt Type:  CARDIOLOGY ANCILLARY VISIT      Date:  Friday, December 05, 2010      Time:  1:00 PM for 60 min   Urgency:  Routine   Made By:  Hoy Finlay Scheduler  To Visit:  [id number]    Reason:  echo/443.9/bcbs prec. req/saf  Appt Comments ------------- -- 12/03/10 13:49: (CEMR) CANCELED -- echo/443.9/bcbs prec. req/saf -- 12/03/10 13:47: (CEMR) BOOKED -- Routine CARDIOLOGY ANCILLARY VISIT at 12/05/2010 1:00 PM for 60 min echo/443.9/bcbs prec. req/saf

## 2011-01-15 NOTE — Miscellaneous (Signed)
Summary: Appointment Canceled  Appointment status changed to canceled by [employer] on 12/03/2010 1:47 PM.  Cancellation Comments --------------------- echo/443.9/bcbs prec. req/saf  Appointment Information ----------------------- Appt Type:  CARDIOLOGY ANCILLARY VISIT      Date:  Wednesday, December 17, 2010      Time:  1:00 PM for 60 min   Urgency:  Routine   Made By:  Hoy Finlay Scheduler  To Visit:  [id number]    Reason:  echo/443.9/bcbs prec. req/saf  Appt Comments ------------- -- 12/03/10 13:47: (CEMR) CANCELED -- echo/443.9/bcbs prec. req/saf -- 12/03/10 13:32: (CEMR) BOOKED -- Routine CARDIOLOGY ANCILLARY VISIT at 12/17/2010 1:00 PM for 60 min echo/443.9/bcbs prec. req/saf

## 2011-01-19 ENCOUNTER — Ambulatory Visit (INDEPENDENT_AMBULATORY_CARE_PROVIDER_SITE_OTHER): Payer: BC Managed Care – PPO

## 2011-01-19 ENCOUNTER — Encounter: Payer: Self-pay | Admitting: Internal Medicine

## 2011-01-19 ENCOUNTER — Other Ambulatory Visit: Payer: Self-pay | Admitting: Internal Medicine

## 2011-01-19 DIAGNOSIS — M545 Low back pain, unspecified: Secondary | ICD-10-CM

## 2011-01-19 DIAGNOSIS — M48061 Spinal stenosis, lumbar region without neurogenic claudication: Secondary | ICD-10-CM

## 2011-01-19 DIAGNOSIS — E538 Deficiency of other specified B group vitamins: Secondary | ICD-10-CM

## 2011-01-21 ENCOUNTER — Other Ambulatory Visit (HOSPITAL_COMMUNITY): Payer: Self-pay

## 2011-01-23 ENCOUNTER — Other Ambulatory Visit: Payer: Self-pay | Admitting: Pulmonary Disease

## 2011-01-23 ENCOUNTER — Institutional Professional Consult (permissible substitution) (INDEPENDENT_AMBULATORY_CARE_PROVIDER_SITE_OTHER): Payer: BC Managed Care – PPO | Admitting: Pulmonary Disease

## 2011-01-23 ENCOUNTER — Encounter: Payer: Self-pay | Admitting: Pulmonary Disease

## 2011-01-23 ENCOUNTER — Encounter (INDEPENDENT_AMBULATORY_CARE_PROVIDER_SITE_OTHER): Payer: Self-pay | Admitting: *Deleted

## 2011-01-23 ENCOUNTER — Other Ambulatory Visit: Payer: BC Managed Care – PPO

## 2011-01-23 DIAGNOSIS — R0609 Other forms of dyspnea: Secondary | ICD-10-CM

## 2011-01-23 DIAGNOSIS — G4710 Hypersomnia, unspecified: Secondary | ICD-10-CM

## 2011-01-23 DIAGNOSIS — R0989 Other specified symptoms and signs involving the circulatory and respiratory systems: Secondary | ICD-10-CM

## 2011-01-23 LAB — BASIC METABOLIC PANEL
CO2: 29 mEq/L (ref 19–32)
Calcium: 9.2 mg/dL (ref 8.4–10.5)
Creatinine, Ser: 0.8 mg/dL (ref 0.4–1.2)
GFR: 75.8 mL/min (ref >60.00)
Glucose, Bld: 90 mg/dL (ref 70–99)
Sodium: 139 mEq/L (ref 135–145)

## 2011-01-23 LAB — CBC WITH DIFFERENTIAL/PLATELET
Basophils Absolute: 0 10*3/uL (ref 0.0–0.1)
Eosinophils Absolute: 0.2 10*3/uL (ref 0.0–0.7)
Hemoglobin: 15 g/dL (ref 12.0–15.0)
Lymphocytes Relative: 30.6 % (ref 12.0–46.0)
MCHC: 33.8 g/dL (ref 30.0–36.0)
Monocytes Relative: 7.3 % (ref 3.0–12.0)
Neutro Abs: 4.3 10*3/uL (ref 1.4–7.7)
Neutrophils Relative %: 58.7 % (ref 43.0–77.0)
RBC: 4.85 Mil/uL (ref 3.87–5.11)
RDW: 14.6 % (ref 11.5–14.6)

## 2011-01-23 LAB — HEPATIC FUNCTION PANEL
AST: 17 U/L (ref 0–37)
Alkaline Phosphatase: 59 U/L (ref 39–117)
Bilirubin, Direct: 0.1 mg/dL (ref 0.0–0.3)
Total Bilirubin: 0.6 mg/dL (ref 0.3–1.2)

## 2011-01-26 ENCOUNTER — Ambulatory Visit: Payer: Self-pay | Admitting: Cardiology

## 2011-01-29 NOTE — Assessment & Plan Note (Signed)
Summary: Sophia Kelley-2WK B12 JWJ STC  Nurse Visit   Allergies: No Known Drug Allergies  Medication Administration  Injection # 1:    Medication: Vit B12 1000 mcg    Diagnosis: VITAMIN B12 DEFICIENCY (ICD-266.2)    Route: IM    Site: L deltoid    Exp Date: 10/14/2012    Lot #: [id number]    Mfr: American Regent    Patient tolerated injection without complications    Given by: Lamar Sprinkles, CMA (January 19, 2011 10:38 AM)  Orders Added: 1)  Admin of Therapeutic Inj  intramuscular or subcutaneous [96372] 2)  Vit B12 1000 mcg [J3420]   Medication Administration  Injection # 1:    Medication: Vit B12 1000 mcg    Diagnosis: VITAMIN B12 DEFICIENCY (ICD-266.2)    Route: IM    Site: L deltoid    Exp Date: 10/14/2012    Lot #: [id number]    Mfr: American Regent    Patient tolerated injection without complications    Given by: Lamar Sprinkles, CMA (January 19, 2011 10:38 AM)  Orders Added: 1)  Admin of Therapeutic Inj  intramuscular or subcutaneous [96372] 2)  Vit B12 1000 mcg [J3420]

## 2011-02-02 ENCOUNTER — Ambulatory Visit: Payer: BC Managed Care – PPO

## 2011-02-03 ENCOUNTER — Ambulatory Visit: Payer: BC Managed Care – PPO

## 2011-02-03 ENCOUNTER — Encounter (INDEPENDENT_AMBULATORY_CARE_PROVIDER_SITE_OTHER): Payer: BC Managed Care – PPO

## 2011-02-03 ENCOUNTER — Encounter: Payer: Self-pay | Admitting: Pulmonary Disease

## 2011-02-03 ENCOUNTER — Ambulatory Visit (INDEPENDENT_AMBULATORY_CARE_PROVIDER_SITE_OTHER): Payer: BC Managed Care – PPO | Admitting: Pulmonary Disease

## 2011-02-03 DIAGNOSIS — G4710 Hypersomnia, unspecified: Secondary | ICD-10-CM

## 2011-02-03 DIAGNOSIS — J309 Allergic rhinitis, unspecified: Secondary | ICD-10-CM | POA: Insufficient documentation

## 2011-02-03 DIAGNOSIS — R0989 Other specified symptoms and signs involving the circulatory and respiratory systems: Secondary | ICD-10-CM

## 2011-02-03 DIAGNOSIS — R209 Unspecified disturbances of skin sensation: Secondary | ICD-10-CM

## 2011-02-03 DIAGNOSIS — F17200 Nicotine dependence, unspecified, uncomplicated: Secondary | ICD-10-CM

## 2011-02-04 ENCOUNTER — Telehealth: Payer: Self-pay | Admitting: Internal Medicine

## 2011-02-04 NOTE — Assessment & Plan Note (Signed)
Summary: consult bronchitis/sh   Visit Type:  Initial Consult Copy to:  Sanda Linger MD Primary Provider/Referring Provider:  Etta Grandchild MD  CC:  Pulmonary Consult. pt c/o sob w/ activity, cough w/ clear phlem states her throat closes and can't breath, and stops breathing at night per her husband x 3-4 months off and on. .  History of Present Illness: 60 yo female for evaluation of dyspnea.  She has noticed problem with her breathing for the past few months.  She feels like her throat is getting closed, and she can't inhale or exhale.  She has suffered several episodes like this over the past several months.  This attacks come on suddenly, and resolve spontaneously.  She is getting winded more easily with activity.  She has sinus congestion and post-nasal drip.  She gets a gurgle and choking sensation in her throat, especially at night.  She has been coughing up clear, thick sputum.  She has a globus sensation in her throat.  She does snore, and has been told she stops breathing while asleep.  She feels sleepy all the time.  She had sinus surgery 25 years ago, and had asthma as a child.  She feels she has terrible allergies, but is not sure what she could be allergic to.  Spring and Fall seem to be the most troublesome seasons.  She denies reflux symptoms.  She is not aware of food allergies.  She continues to smoke 1/4 pack per day.  She has been gaining weight.  She occasionally gets skin rashes, and her description sounds like hives; however she has not had this recently.  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  She has owned the bird for 3 years.  There is no history of pneumonia or TB.  She is from [place], and denies any recent travel.  There is no recent sick exposures, and denies occupational exposures.   CXR  Procedure date:  11/20/2010  Findings:       CHEST - 2 VIEW    Comparison: None.    Findings: The lungs are clear.  Mediastinal contours  appear normal.   The heart is within normal limits in size.  No acute bony   abnormality is seen.    IMPRESSION:   No active lung disease.   Echocardiogram  Procedure date:  12/17/2010  Findings:          Study Conclusions            - Left ventricle: The cavity size was normal. Wall thickness was       increased in a pattern of mild LVH. Systolic function was normal.       The estimated ejection fraction was in the range of 55% to 65%.       Wall motion was normal; there were no regional wall motion       abnormalities. Doppler parameters are consistent with abnormal       left ventricular relaxation (grade 1 diastolic dysfunction).     - Mitral valve: Calcified annulus. There was systolic anterior       motion.     - Left atrium: The atrium was mildly dilated.     - Atrial septum: There was an atrial septal aneurysm.     Impressions:            - Question RA mass on subcostal views; suggest TEE or cardiac MRI if       clinically  indicated.  Echocardiogram  Procedure date:  12/26/2010  Findings:      Study Conclusions  - Left ventricle: There was mild concentric hypertrophy. Systolic   function was normal. The estimated ejection fraction was in the   range of 60% to 65%. Wall motion was normal; there were no   regional wall motion abnormalities. - Aortic valve: There was no stenosis. - Aorta: Normal caliber, minimal plaque. - Mitral valve: No significant regurgitation. - Left atrium: The atrium was mildly dilated. No evidence of   thrombus in the atrial cavity or appendage. - Right ventricle: The cavity size was normal. Systolic function was   normal. - Right atrium: No evidence of thrombus in the atrial cavity or   appendage. - Atrial septum: There was increased thickness of the septum,   consistent with lipomatous hypertrophy. 1 bubble was noted to   enter the right atrium late, suspect that there is not a PFO. 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   Current Medications (verified): 1)  Aspirin 81 Mg Tbec (Aspirin) .... One Daily 2)  Pravachol 40 Mg Tabs (Pravastatin Sodium) .... One Daily in The Evening  Allergies (verified): No Known Drug Allergies  Past History:  Past Medical History: Reviewed history from 12/03/2010 and no changes required. 1. HYPERLIPIDEMIA (ICD-272.4) 2. COLONIC POLYPS, ADENOMATOUS, HX OF (ICD-V12.72) 3. IRRITABLE BOWEL SYNDROME (ICD-564.1) 4. HEMORRHOIDS (ICD-455.6) 5. COLONIC INERTIA 6. Active smoker 7. OSA (suspected) 8. Obesity  Past Surgical History: Reviewed history from 10/29/2009 and no changes required. C-Section x 2 Nasal Septal Surgery Hysterectomy  Family History: Reviewed history from 12/03/2010 and no changes required. Father - MI age 49 x 3, Allergies Brother - Prostate cancer  Social History: Reviewed history from 12/03/2010 and no changes required. Married.  Unemployed.  Started smoking age 46, and currently smokes 1/4 pack per day.  No significant alcohol use.  Drinks one cup of coffee per day.  Review of Systems       The patient complains of shortness of breath with activity, shortness of breath at rest, productive cough, irregular heartbeats, weight change, nasal congestion/difficulty breathing through nose, ear ache, hand/feet swelling, rash, and change in color of mucus.  The patient denies non-productive cough, coughing up blood, chest pain, acid heartburn, indigestion, loss of appetite, abdominal pain, difficulty swallowing, sore throat, tooth/dental problems, headaches, sneezing, itching, anxiety, depression, joint stiffness or pain, and fever.    Vital Signs:  Patient profile:   60 year old female Menstrual status:  hysterectomy Height:      66 inches Weight:      282.50 pounds BMI:     45.76 O2 Sat:      96 % on Room air Temp:     98.7 degrees F oral Pulse rate:   72 / minute BP sitting:   100 / 80  (left  arm) Cuff size:   large  Vitals Entered By: Carver Fila (January 23, 2011 10:48 AM)  O2 Flow:  Room air CC: Pulmonary Consult. pt c/o sob w/ activity, cough w/ clear phlem states her throat closes and can't breath, stops breathing at night per her husband x 3-4 months off and on.  Comments meds and allergies updated Phone number updated Carver Fila  January 23, 2011 10:48 AM    Physical Exam  General:  normal appearance and obese.   Eyes:  PERRLA and EOMI.   Ears:  cerumen build up Nose:  prominent turbinates, boggy mucosa, clear drainage Mouth:  MP 4, elongated uvula, no exudate Neck:  no JVD.   Chest Wall:  no deformities noted Lungs:  clear bilaterally to auscultation and percussion Heart:  regular rate and rhythm, S1, S2 without murmurs, rubs, gallops, or clicks Abdomen:  bowel sounds positive; abdomen soft and non-tender without masses, or organomegaly Pulses:  pulses normal Extremities:  no clubbing, cyanosis, edema, or deformity noted Neurologic:  normal CN II-XII, 4/5 strength lower extremities Skin:  intact without lesions or rashes Cervical Nodes:  no significant adenopathy Psych:  alert and cooperative; normal mood and affect; normal attention span and concentration   Impression & Recommendations:  Problem # 1:  DYSPNEA/SHORTNESS OF BREATH (ICD-786.09) Likely multifactorial.  She has history of asthma and allergies in association with tobacco use.  She could have an obstructive lung disease.  I will schedule pulmonary function testing, and empirical trial of ventolin.  Much of her symptoms also appear related to sinus disease with post-nasal drip.  I have asked her to use nasal irrigation and flonase.  Will also check her labs and RAST with IgE.  She may have a component of vocal cord dysfunction also.  She was noted to have grade 1 diastolic dysfunction on recent Echo, and this might be contributing some to her dyspnea.  She is followed by primary care and  cardiology for this.  She c/o of paresthesias and muscle weakness.  She could have a neuromuscular component to her dyspnea, and may require further neurologic evaluation.  She is obese, and relatively sedentary.  She likely has a component of deconditioning.  There is nothing in her history or exam to suggest chronic thrombo-embolic disease, and will defer further assessment of this for now.  In addition her right ventricle was essential normal on recent TEE.  Problem # 2:  HYPERSOMNIA (ICD-780.54) She has symptoms of sleep disruption and daytime sleepiness.  I am concerned that she could have sleep apnea.  Will arrange for sleep test to further evaluate.  Driving precautions and weight loss were discussed.  Will give her a script for zolpidem to help with sleep initiation during sleep test.  Problem # 3:  TOBACCO USE (ICD-305.1) Reviewed the importance of complete smoking cessation.  Medications Added to Medication List This Visit: 1)  Ventolin Hfa 108 (90 Base) Mcg/act Aers (Albuterol sulfate) .... Two puffs up to four times per day as needed 2)  Flonase 50 Mcg/act Susp (Fluticasone propionate) .... Two sprays once daily 3)  Zolpidem Tartrate 5 Mg Tabs (Zolpidem tartrate) .... Take 1 tab by mouth at bedtime as needed sleep  Complete Medication List: 1)  Aspirin 81 Mg Tbec (Aspirin) .... One daily 2)  Pravachol 40 Mg Tabs (Pravastatin sodium) .... One daily in the evening 3)  Ventolin Hfa 108 (90 Base) Mcg/act Aers (Albuterol sulfate) .... Two puffs up to four times per day as needed 4)  Flonase 50 Mcg/act Susp (Fluticasone propionate) .... Two sprays once daily 5)  Zolpidem Tartrate 5 Mg Tabs (Zolpidem tartrate) .... Take 1 tab by mouth at bedtime as needed sleep  Other Orders: Consultation Level IV (04540) Full Pulmonary Function Test (PFT) TLB-BMP (Basic Metabolic Panel-BMET) (80048-METABOL) TLB-CBC Platelet - w/Differential (85025-CBCD) TLB-Hepatic/Liver Function Pnl  (80076-HEPATIC) TLB-TSH (Thyroid Stimulating Hormone) (84443-TSH) T-"RAST" (Allergy Full Profile) IGE (98119-14782) Sleep Study (Sleep Study)  Patient Instructions: 1)  Flonase two sprays each nostril once daily 2)  Ventolin two puffs up to four times per day as needed  for cough, wheeze, congestion, or short of breath 3)  Will schedule sleep test 4)  Will schedule breathing test (PFT) 5)  Lab test today 6)  Follow up in 2 to 3 weeks Prescriptions: ZOLPIDEM TARTRATE 5 MG TABS (ZOLPIDEM TARTRATE) Take 1 tab by mouth at bedtime as needed sleep  #30 x 0   Entered by:   Zackery Barefoot CMA   Authorized by:   Coralyn Helling MD   Signed by:   Zackery Barefoot CMA on 01/23/2011   Method used:   Print then Give to Patient   RxID:   [id number] FLONASE 50 MCG/ACT SUSP (FLUTICASONE PROPIONATE) two sprays once daily  #1 x 3   Entered and Authorized by:   Coralyn Helling MD   Signed by:   Coralyn Helling MD on 01/23/2011   Method used:   Electronically to        [facility] # [phone]* (retail)       [address]       [place], [place]  [address]       Ph: [phone]       Fax: [phone]   RxID:   [id number] VENTOLIN HFA 108 (90 BASE) MCG/ACT AERS (ALBUTEROL SULFATE) two puffs up to four times per day as needed  #1 x 3   Entered and Authorized by:   Coralyn Helling MD   Signed by:   Coralyn Helling MD on 01/23/2011   Method used:   Electronically to        [facility] # [phone]* (retail)       [address]       [place], [place]  [address]       Ph: [phone]       Fax: [phone]   RxID:   [id number]

## 2011-02-10 ENCOUNTER — Telehealth (INDEPENDENT_AMBULATORY_CARE_PROVIDER_SITE_OTHER): Payer: Self-pay | Admitting: *Deleted

## 2011-02-10 NOTE — Assessment & Plan Note (Signed)
Summary: PFT/LED   Pulmonary Function Test Date: 02/03/2011 Height (in.): 66 Gender: Female  Pre-Spirometry FVC    Value: 3.46 L/min   Pred: 3.32 L/min     % Pred: 104 % FEV1    Value: 2.64 L     Pred: 2.47 L     % Pred: 107 % FEV1/FVC  Value: 76 %     Pred: 74 %     % Pred: . % FEF 25-75  Value: 2.11 L/min   Pred: 2.74 L/min     % Pred: 77 %  Post-Spirometry FVC    Value: 3.21 L/min   Pred: 3.32 L/min     % Pred: 97 % FEV1    Value: 2.49 L     Pred: 2.47 L     % Pred: 101 % FEV1/FVC  Value: 78 %     Pred: 74 %     % Pred: . % FEF 25-75  Value: 2.15 L/min   Pred: 2.74 L/min     % Pred: 78 %  Lung Volumes TLC    Value: 5.44 L   % Pred: 103 % RV    Value: 1.98 L   % Pred: 99 % DLCO    Value: 19.8 %   % Pred: 67 % DLCO/VA  Value: 4.28 %   % Pred: 113 %  Comments: No obstruction.  No bronchodilator response.  Normal lung volumes.  Mild diffusion defect.  Allergies: No Known Drug Allergies   Pulmonary Function Test Date: 02/03/2011 Height (in.): 66 Gender: Female  Pre-Spirometry FVC    Value: 3.46 L/min   Pred: 3.32 L/min     % Pred: 104 % FEV1    Value: 2.64 L     Pred: 2.47 L     % Pred: 107 % FEV1/FVC  Value: 76 %     Pred: 74 %     % Pred: . % FEF 25-75  Value: 2.11 L/min   Pred: 2.74 L/min     % Pred: 77 %  Post-Spirometry FVC    Value: 3.21 L/min   Pred: 3.32 L/min     % Pred: 97 % FEV1    Value: 2.49 L     Pred: 2.47 L     % Pred: 101 % FEV1/FVC  Value: 78 %     Pred: 74 %     % Pred: . % FEF 25-75  Value: 2.15 L/min   Pred: 2.74 L/min     % Pred: 78 %  Lung Volumes TLC    Value: 5.44 L   % Pred: 103 % RV    Value: 1.98 L   % Pred: 99 % DLCO    Value: 19.8 %   % Pred: 67 % DLCO/VA  Value: 4.28 %   % Pred: 113 %  Comments: No obstruction.  No bronchodilator response.  Normal lung volumes.  Mild diffusion defect.  Other Orders: Carbon Monoxide diffusing w/capacity (02725) Lung Volumes/Gas dilution or washout (36644) Spirometry (Pre & Post) [phone])

## 2011-02-10 NOTE — Progress Notes (Signed)
Summary: MRI Referral  Phone Note Outgoing Call   Summary of Call: Dr Yetta Barre, After getting insurance approval, [facility] had been trying to contact pt.I spoke with pt husband who said that MRI was going to have to be postponed, that some other things had come up. Will call back when ready to schedule. Initial call taken by: Dagoberto Reef,  February 04, 2011 8:48 AM

## 2011-02-10 NOTE — Assessment & Plan Note (Signed)
Summary: 3 WEEK FOLLOW-UP AND AFTER PFT/LED   Copy to:  Sanda Linger MD Primary Provider/Referring Provider:  Etta Grandchild MD  CC:  3 week follow up w/ pft's. Pt states her breathing has unchanged. Pt states she feels like she has a lump in her throat and it won't go away x couple months. Pt states she gets choked easy as well and cough w/ clear thick phlem. Sophia Kelley  History of Present Illness: 60 yo female with dyspnea tobacco, hypersomnia and psg scheduled, asthma ?copd, try ventolin, sinus with pnd, ?vcd, g1d, muscle weak, decond  She still gets winded easily with activity.  Her sinuses are opening up.  She still has sinus drainage and post-nasal drip.  She still feels like something is stuck in her throat.  She uses flonase in the morning, and this helps.  She uses ventolin before bed, but is not sure if this helps.  She continues to smoke 3 to 4 cigarettes per day.  She is scheduled for her sleep test on March 5.  She has not been using any anti-histamines.  Lab tests from Feb 10 were remarkable for positive RAST wit hIgE 1569.    Pulmonary function test today showed normal spirometry, no bronchodilator response, normal lung volumes, and mild diffusion defect.  Current Medications (verified): 1)  Aspirin 81 Mg Tbec (Aspirin) .... One Daily 2)  Pravachol 40 Mg Tabs (Pravastatin Sodium) .... One Daily in The Evening 3)  Ventolin Hfa 108 (90 Base) Mcg/act Aers (Albuterol Sulfate) .... Two Puffs Up To Four Times Per Day As Needed 4)  Flonase 50 Mcg/act Susp (Fluticasone Propionate) .... Two Sprays Once Daily 5)  Zolpidem Tartrate 5 Mg Tabs (Zolpidem Tartrate) .... Take 1 Tab By Mouth At Bedtime As Needed Sleep (Has Not Picked Up Yet)  Allergies (verified): No Known Drug Allergies  Past History:  Past Medical History: 1. HYPERLIPIDEMIA (ICD-272.4) 2. COLONIC POLYPS, ADENOMATOUS, HX OF (ICD-V12.72) 3. IRRITABLE BOWEL SYNDROME (ICD-564.1) 4. HEMORRHOIDS (ICD-455.6) 5. COLONIC  INERTIA 6. Active smoker 7. OSA (suspected) 8. Obesity 9. Allergic rhinitis     - RAST positive, IgE 1569 from Jan 23, 2011 10. Dyspnea      - PFT 02/03/11>>FEV1 2.64(107%), FEV1% 76, TLC 5.44(103%), DLCO 67%, no BD  Past Surgical History: Reviewed history from 10/29/2009 and no changes required. C-Section x 2 Nasal Septal Surgery Hysterectomy  Vital Signs:  Patient profile:   59 year old female Menstrual status:  hysterectomy Height:      66 inches Weight:      283 pounds BMI:     45.84 O2 Sat:      99 % on Room air Temp:     97.5 degrees F oral Pulse rate:   95 / minute BP sitting:   122 / 78  (left arm) Cuff size:   large  Vitals Entered By: Carver Fila (February 03, 2011 1:28 PM)  O2 Flow:  Room air CC: 3 week follow up w/ pft's. Pt states her breathing has unchanged. Pt states she feels like she has a lump in her throat and it won't go away x couple months. Pt states she gets choked easy as well, cough w/ clear thick phlem.  Comments meds and allergies updated Phone number updated Carver Fila  February 03, 2011 1:28 PM    Physical Exam  General:  normal appearance and obese.   Ears:  cerumen build up Nose:  prominent turbinates, boggy mucosa, clear drainage Mouth:  MP 4, elongated  uvula, no exudate Neck:  no JVD.   Lungs:  clear bilaterally to auscultation and percussion Heart:  regular rate and rhythm, S1, S2 without murmurs, rubs, gallops, or clicks Extremities:  no clubbing, cyanosis, edema, or deformity noted Neurologic:  normal CN II-XII.   Cervical Nodes:  no significant adenopathy   Impression & Recommendations:  Problem # 1:  DYSPNEA/SHORTNESS OF BREATH (ICD-786.09) Likely from allergic rhinitis, post-nasal drip, vocal cord dysfunction, diastolic dysfunction, and obesity with deconditioning.  She did not have evidence for airflow obstruction on pulmonary function testing today inspite of continued tobacco abuse.  I suspect her mild diffusion defect  is related to obesity, as she had recent chest xray which was unremarkable for pulmonary infiltrative process.  Problem # 2:  TOBACCO USE (ICD-305.1)  Reviewed the importance of complete smoking cessation.  Problem # 3:  HYPERSOMNIA (ICD-780.54) She is scheduled for sleep study in March.  Will call to discuss results when available.  Problem # 4:  PARESTHESIA (ICD-782.0) She continues to c/o leg weakness.  Will defer further assessment to primary care.  Problem # 5:  ALLERGIC RHINITIS (ICD-477.9) Much of her current symptoms are related to her upper airway and post-nasal drip.  This is likely leading to vocal cord irritation and globus sensation.  Will have her use nasal irrigation and continue flonase.  She is to use loratadine on a regular basis until her sinus symptoms improve, and then as needed.  Will also have her use zafirlukast.  She can continue ventolin as needed.  I am less convinced that she has asthma at this time, but will need to re-assess during her allergy season in the Spring.  Again reviewed importance of environmental controls.  Medications Added to Medication List This Visit: 1)  Zolpidem Tartrate 5 Mg Tabs (Zolpidem tartrate) .... Take 1 tab by mouth at bedtime as needed sleep (has not picked up yet) 2)  Loratadine 10 Mg Tabs (Loratadine) .... One by mouth once daily for allergies 3)  Zafirlukast 20 Mg Tabs (Zafirlukast) .... One two times a day  Complete Medication List: 1)  Aspirin 81 Mg Tbec (Aspirin) .... One daily 2)  Pravachol 40 Mg Tabs (Pravastatin sodium) .... One daily in the evening 3)  Zolpidem Tartrate 5 Mg Tabs (Zolpidem tartrate) .... Take 1 tab by mouth at bedtime as needed sleep (has not picked up yet) 4)  Flonase 50 Mcg/act Susp (Fluticasone propionate) .... Two sprays once daily 5)  Loratadine 10 Mg Tabs (Loratadine) .... One by mouth once daily for allergies 6)  Zafirlukast 20 Mg Tabs (Zafirlukast) .... One two times a day 7)  Ventolin Hfa 108  (90 Base) Mcg/act Aers (Albuterol sulfate) .... Two puffs up to four times per day as needed  Other Orders: Est. Patient Level IV (81191)  Patient Instructions: 1)  Nasal irrigation (saline nasal rinse) once daily, then flonase two sprays each nostril once daily 2)  Zafirlukast 20 mg two times a day  3)  Loratadine 10 mg once daily for allergies 4)  Ventolin two puffs four times per day as needed 5)  Salt water gargle as needed 6)  Follow up in 4 to 6 weeks Prescriptions: ZAFIRLUKAST 20 MG TABS (ZAFIRLUKAST) one two times a day  #60 x 6   Entered and Authorized by:   Coralyn Helling MD   Signed by:   Coralyn Helling MD on 02/03/2011   Method used:   Electronically to        [facility] # [phone]* (retail)       [phone]       [place], [place]  [address]       Ph: [phone]       Fax: [phone]   RxID:   [id number]

## 2011-02-11 ENCOUNTER — Encounter: Payer: Self-pay | Admitting: Internal Medicine

## 2011-02-16 ENCOUNTER — Ambulatory Visit (HOSPITAL_BASED_OUTPATIENT_CLINIC_OR_DEPARTMENT_OTHER): Payer: BC Managed Care – PPO | Attending: Pulmonary Disease

## 2011-02-16 ENCOUNTER — Encounter: Payer: Self-pay | Admitting: Pulmonary Disease

## 2011-02-16 DIAGNOSIS — R0609 Other forms of dyspnea: Secondary | ICD-10-CM | POA: Insufficient documentation

## 2011-02-16 DIAGNOSIS — R0989 Other specified symptoms and signs involving the circulatory and respiratory systems: Secondary | ICD-10-CM | POA: Insufficient documentation

## 2011-02-16 DIAGNOSIS — G4730 Sleep apnea, unspecified: Secondary | ICD-10-CM | POA: Insufficient documentation

## 2011-02-16 DIAGNOSIS — G4710 Hypersomnia, unspecified: Secondary | ICD-10-CM | POA: Insufficient documentation

## 2011-02-16 DIAGNOSIS — Z79899 Other long term (current) drug therapy: Secondary | ICD-10-CM | POA: Insufficient documentation

## 2011-02-16 DIAGNOSIS — Z7982 Long term (current) use of aspirin: Secondary | ICD-10-CM | POA: Insufficient documentation

## 2011-02-16 DIAGNOSIS — I519 Heart disease, unspecified: Secondary | ICD-10-CM | POA: Insufficient documentation

## 2011-02-16 DIAGNOSIS — I4949 Other premature depolarization: Secondary | ICD-10-CM | POA: Insufficient documentation

## 2011-02-16 DIAGNOSIS — I10 Essential (primary) hypertension: Secondary | ICD-10-CM | POA: Insufficient documentation

## 2011-02-19 NOTE — Progress Notes (Signed)
Summary: refill  Phone Note Call from Patient Call back at Home Phone [phone]   Caller: Spouse//gary Call For: sood Reason for Call: Refill Medication Summary of Call: States that pharmacy never received rx for loratadine.//[facility] Initial call taken by: Darletta Moll,  February 10, 2011 4:04 PM  Follow-up for Phone Call        Called abd spoke with [facility] pharmacy, who says they have pt loratadine ready for pickup. Also Zolpidem is there but have on hold.   Spoke with pt husband and informed rx for loratadine is there and ready for pickup .Kandice Hams [facility]  February 10, 2011 4:29 PM  Follow-up by: Kandice Hams CMA,  February 10, 2011 4:29 PM

## 2011-02-19 NOTE — Progress Notes (Signed)
Summary: refill  Phone Note Call from Patient Call back at Home Phone [phone]   Caller: patient- Call For: sood Reason for Call: Refill Medication, Talk to Nurse Summary of Call: Patient needing refill--zolpidem 5 mg and loratadine 10mg --[facility] [address] [place] Initial call taken by: Lehman Prom,  February 10, 2011 11:51 AM Caller: Annalee Genta.* # [phone]*  Follow-up for Phone Call        Pt informed that refills were sent to pharmacy. Abigail Miyamoto RN  February 10, 2011 12:39 PM     Prescriptions: LORATADINE 10 MG TABS (LORATADINE) one by mouth once daily for allergies  #30 x 6   Entered by:   Abigail Miyamoto RN   Authorized by:   Coralyn Helling MD   Signed by:   Abigail Miyamoto RN on 02/10/2011   Method used:   Telephoned to ...       [facility]  [address] # [phone]* (retail)       [phone] [address]       [place], [place]  [address]       Ph: [phone]       Fax: [phone]   RxID:   [id number] ZOLPIDEM TARTRATE 5 MG TABS (ZOLPIDEM TARTRATE) Take 1 tab by mouth at bedtime as needed sleep (has not picked up yet)  #30 x 6   Entered by:   Abigail Miyamoto RN   Authorized by:   Coralyn Helling MD   Signed by:   Abigail Miyamoto RN on 02/10/2011   Method used:   Telephoned to ...       [facility]  [address] # [phone]* (retail)       [phone] [address]       [place], [place]  [address]       Ph: [phone]       Fax: [phone]   RxID:   [id number]

## 2011-02-20 ENCOUNTER — Other Ambulatory Visit: Payer: BC Managed Care – PPO

## 2011-02-20 ENCOUNTER — Other Ambulatory Visit: Payer: Self-pay | Admitting: Internal Medicine

## 2011-02-20 ENCOUNTER — Encounter: Payer: Self-pay | Admitting: Internal Medicine

## 2011-02-20 ENCOUNTER — Encounter (INDEPENDENT_AMBULATORY_CARE_PROVIDER_SITE_OTHER): Payer: Self-pay | Admitting: *Deleted

## 2011-02-20 ENCOUNTER — Ambulatory Visit (INDEPENDENT_AMBULATORY_CARE_PROVIDER_SITE_OTHER): Payer: BC Managed Care – PPO | Admitting: Internal Medicine

## 2011-02-20 DIAGNOSIS — R5381 Other malaise: Secondary | ICD-10-CM | POA: Insufficient documentation

## 2011-02-20 DIAGNOSIS — R209 Unspecified disturbances of skin sensation: Secondary | ICD-10-CM

## 2011-02-20 DIAGNOSIS — G4733 Obstructive sleep apnea (adult) (pediatric): Secondary | ICD-10-CM | POA: Insufficient documentation

## 2011-02-20 DIAGNOSIS — E039 Hypothyroidism, unspecified: Secondary | ICD-10-CM

## 2011-02-20 DIAGNOSIS — R5383 Other fatigue: Secondary | ICD-10-CM | POA: Insufficient documentation

## 2011-02-20 DIAGNOSIS — IMO0002 Reserved for concepts with insufficient information to code with codable children: Secondary | ICD-10-CM | POA: Insufficient documentation

## 2011-02-20 DIAGNOSIS — F17200 Nicotine dependence, unspecified, uncomplicated: Secondary | ICD-10-CM

## 2011-02-20 DIAGNOSIS — G4730 Sleep apnea, unspecified: Secondary | ICD-10-CM

## 2011-02-20 LAB — TSH: TSH: 4.24 u[IU]/mL (ref 0.35–5.50)

## 2011-02-20 LAB — T3, FREE: T3, Free: 3.2 pg/mL (ref 2.3–4.2)

## 2011-02-20 LAB — T4, FREE: Free T4: 0.95 ng/dL (ref 0.60–1.60)

## 2011-02-24 NOTE — Assessment & Plan Note (Signed)
Summary: F/U APPT   Vital Signs:  Patient profile:   60 year old female Menstrual status:  hysterectomy Height:      66 inches Weight:      276 pounds O2 Sat:      96 % on Room air Temp:     98.3 degrees F oral Pulse rate:   90 / minute Pulse rhythm:   regular Resp:     16 per minute BP sitting:   122 / 84  (left arm)  O2 Flow:  Room air  Primary Care Provider:  Etta Grandchild MD   History of Present Illness: She returns for f/up and she tells me that her GYN, Dr. Jennette Kettle, told her that she had an underactive thyroid gland and it needs to be treated. She does not have a copy of the labs today and Dr. Donnetta Hail office would not release the results to me over the phone today.  She tells me that her sleep study 5 days ago was very abnormal with severe apnea.  She has persistent low back pain that radiates into her legs and has not done the MRI yet.  Preventive Screening-Counseling & Management  Alcohol-Tobacco     Alcohol drinks/day: 0     Alcohol Counseling: not indicated; patient does not drink     Smoking Status: current     Smoking Cessation Counseling: yes     Packs/Day: 0.5     Year Started: 1974     Pack years: 25     Tobacco Counseling: to quit use of tobacco products  Hep-HIV-STD-Contraception     Hepatitis Risk: no risk noted     HIV Risk: no risk noted     STD Risk: no risk noted      Sexual History:  currently monogamous.        Drug Use:  never.        Blood Transfusions:  no.    Clinical Review Panels:  Prevention   Last Mammogram:  normal (01/14/2010)   Last Pap Smear:  normal (01/14/2010)   Last Colonoscopy:  DONE (12/17/2009)  Immunizations   Last Flu Vaccine:  Fluvax 3+ (11/24/2010)  Lipid Management   Cholesterol:  211 (11/20/2010)   HDL (good cholesterol):  45.90 (11/20/2010)  Diabetes Management   HgBA1C:  6.0 (11/20/2010)   Creatinine:  0.8 (01/23/2011)   Last Flu Vaccine:  Fluvax 3+ (11/24/2010)  CBC   WBC:  7.4 (01/23/2011)  RBC:  4.85 (01/23/2011)   Hgb:  15.0 (01/23/2011)   Hct:  44.3 (01/23/2011)   Platelets:  188.0 (01/23/2011)   MCV  91.4 (01/23/2011)   MCHC  33.8 (01/23/2011)   RDW  14.6 (01/23/2011)   PMN:  58.7 (01/23/2011)   Lymphs:  30.6 (01/23/2011)   Monos:  7.3 (01/23/2011)   Eosinophils:  3.0 (01/23/2011)   Basophil:  0.4 (01/23/2011)  Complete Metabolic Panel   Glucose:  90 (01/23/2011)   Sodium:  139 (01/23/2011)   Potassium:  5.2 (01/23/2011)   Chloride:  105 (01/23/2011)   CO2:  29 (01/23/2011)   BUN:  11 (01/23/2011)   Creatinine:  0.8 (01/23/2011)   Albumin:  3.6 (01/23/2011)   Total Protein:  6.6 (01/23/2011)   Calcium:  9.2 (01/23/2011)   Total Bili:  0.6 (01/23/2011)   Alk Phos:  59 (01/23/2011)   SGPT (ALT):  19 (01/23/2011)   SGOT (AST):  17 (01/23/2011)   Medications Prior to Update: 1)  Aspirin 81 Mg Tbec (Aspirin) .... One  Daily 2)  Pravachol 40 Mg Tabs (Pravastatin Sodium) .... One Daily in The Evening 3)  Zolpidem Tartrate 5 Mg Tabs (Zolpidem Tartrate) .... Take 1 Tab By Mouth At Bedtime As Needed Sleep (Has Not Picked Up Yet) 4)  Flonase 50 Mcg/act Susp (Fluticasone Propionate) .... Two Sprays Once Daily 5)  Loratadine 10 Mg Tabs (Loratadine) .... One By Mouth Once Daily For Allergies 6)  Zafirlukast 20 Mg Tabs (Zafirlukast) .... One Two Times A Day 7)  Ventolin Hfa 108 (90 Base) Mcg/act Aers (Albuterol Sulfate) .... Two Puffs Up To Four Times Per Day As Needed  Current Medications (verified): 1)  Aspirin 81 Mg Tbec (Aspirin) .... One Daily 2)  Pravachol 40 Mg Tabs (Pravastatin Sodium) .... One Daily in The Evening 3)  Zolpidem Tartrate 5 Mg Tabs (Zolpidem Tartrate) .... Take 1 Tab By Mouth At Bedtime As Needed Sleep (Has Not Picked Up Yet) 4)  Flonase 50 Mcg/act Susp (Fluticasone Propionate) .... Two Sprays Once Daily 5)  Loratadine 10 Mg Tabs (Loratadine) .... One By Mouth Once Daily For Allergies 6)  Zafirlukast 20 Mg Tabs (Zafirlukast) .... One Two Times A  Day 7)  Ventolin Hfa 108 (90 Base) Mcg/act Aers (Albuterol Sulfate) .... Two Puffs Up To Four Times Per Day As Needed  Allergies (verified): No Known Drug Allergies  Past History:  Past Surgical History: Last updated: 10/29/2009 C-Section x 2 Nasal Septal Surgery Hysterectomy  Family History: Last updated: 01/23/2011 Father - MI age 79 x 3, Allergies Brother - Prostate cancer  Social History: Last updated: 01/23/2011 Married.  Unemployed.  Started smoking age 26, and currently smokes 1/4 pack per day.  No significant alcohol use.  Drinks one cup of coffee per day.  Risk Factors: Alcohol Use: 0 (02/20/2011)  Risk Factors: Smoking Status: current (02/20/2011) Packs/Day: 0.5 (02/20/2011)  Past Medical History: 1. HYPERLIPIDEMIA (ICD-272.4) 2. COLONIC POLYPS, ADENOMATOUS, HX OF (ICD-V12.72) 3. IRRITABLE BOWEL SYNDROME (ICD-564.1) 4. HEMORRHOIDS (ICD-455.6) 5. COLONIC INERTIA 6. Active smoker 7. OSA (suspected) 8. Obesity 9. Allergic rhinitis     - RAST positive, IgE 1569 from Jan 23, 2011 10. Dyspnea      - PFT 02/03/11>>FEV1 2.64(107%), FEV1% 76, TLC 5.44(103%), DLCO 67%, no BD Hypothyroidism  Family History: Reviewed history from 01/23/2011 and no changes required. Father - MI age 107 x 3, Allergies Brother - Prostate cancer  Social History: Reviewed history from 01/23/2011 and no changes required. Married.  Unemployed.  Started smoking age 50, and currently smokes 1/4 pack per day.  No significant alcohol use.  Drinks one cup of coffee per day.  Review of Systems  The patient denies anorexia, fever, weight loss, weight gain, chest pain, syncope, dyspnea on exertion, peripheral edema, prolonged cough, headaches, hemoptysis, abdominal pain, hematuria, muscle weakness, suspicious skin lesions, difficulty walking, depression, abnormal bleeding, and enlarged lymph nodes.   General:  Complains of fatigue and malaise; denies chills, fever, loss of appetite, sleep  disorder, sweats, weakness, and weight loss. MS:  Complains of low back pain; denies joint pain, joint redness, joint swelling, loss of strength, mid back pain, muscle aches, cramps, muscle weakness, stiffness, and thoracic pain. Neuro:  Denies brief paralysis, difficulty with concentration, disturbances in coordination, falling down, headaches, inability to speak, numbness, poor balance, sensation of room spinning, tingling, tremors, visual disturbances, and weakness. Endo:  Denies cold intolerance, excessive hunger, excessive thirst, excessive urination, heat intolerance, polyuria, and weight change.  Physical Exam  General:  alert, well-developed, well-nourished, well-hydrated, and overweight-appearing.  Head:  normocephalic, atraumatic, no abnormalities observed, and no abnormalities palpated.   Eyes:  vision grossly intact, pupils equal, pupils round, and pupils reactive to light.   Mouth:  Oral mucosa and oropharynx without lesions or exudates.  Teeth in good repair. Neck:  supple, full ROM, no masses, no thyromegaly, no thyroid nodules or tenderness, no JVD, no HJR, normal carotid upstroke, no carotid bruits, no cervical lymphadenopathy, and no neck tenderness.   Lungs:  normal respiratory effort, no intercostal retractions, no accessory muscle use, normal breath sounds, no dullness, no fremitus, no crackles, and no wheezes.   Heart:  normal rate, regular rhythm, no murmur, no gallop, no rub, and no JVD.   Abdomen:  soft, non-tender, normal bowel sounds, no distention, no masses, no guarding, no rigidity, no rebound tenderness, no abdominal hernia, no inguinal hernia, no hepatomegaly, and no splenomegaly.   Msk:  normal ROM, no joint tenderness, no joint swelling, no joint warmth, no redness over joints, no joint deformities, no joint instability, and no crepitation.   Pulses:  R radial normal, L radial normal, R femoral decreased, R popliteal decreased, R posterior tibial decreased, R  dorsalis pedis decreased, L femoral decreased, L popliteal decreased, L posterior tibial decreased, and L dorsalis pedis decreased.   Extremities:  trace left pedal edema and trace right pedal edema. she has very faint erythema and diffuse ttp in the bilateral anterior lower leg area. She has good capillary refill in the distal toes.  Neurologic:  alert & oriented X3, cranial nerves II-XII intact, sensation intact to light touch, heel-to-shin normal, toes down bilaterally on Babinski, Romberg negative, ataxic gait, RLE hyperreflexia, RLE weakness, LLE hyperreflexia, and LLE weakness.   Skin:  turgor normal, color normal, no rashes, no suspicious lesions, no ecchymoses, no petechiae, no purpura, no ulcerations, and no edema.   Cervical Nodes:  no anterior cervical adenopathy and no posterior cervical adenopathy.   Psych:  Oriented X3, memory intact for recent and remote, normally interactive, good eye contact, not depressed appearing, not agitated, not suicidal, not homicidal, and moderately anxious.     Detailed Back/Spine Exam  General:    obese.    Gait:    Normal heel-toe gait pattern bilaterally.    Lumbosacral Exam:  Inspection-deformity:    Normal Palpation-spinal tenderness:  Normal Range of Motion:    Forward Flexion:   85 degrees    Hyperextension:   25 degrees    Right Lateral Bend:   25 degrees    Left Lateral Bend:   25 degrees Squatting:  normal Lying Straight Leg Raise:    Right:  negative Sitting Straight Leg Raise:    Right:  negative    Left:  negative Reverse Straight Leg Raise:    Left:  negative Contralateral Straight Leg Raise:    Right:  negative    Left:  negative Sciatic Notch:    There is no sciatic notch tenderness. Toe Walking:    Right:  normal    Left:  normal Heel Walking:    Right:  normal    Left:  normal   Impression & Recommendations:  Problem # 1:  LUMBAR RADICULOPATHY (ICD-724.4) Assessment New  Her updated medication list for  this problem includes:    Aspirin 81 Mg Tbec (Aspirin) ..... One daily  Orders: Radiology Referral (Radiology)  Discussed use of moist heat or ice, modified activities, medications, and stretching/strengthening exercises. Back care instructions given. To be seen in 2 weeks if no improvement; sooner  if worsening of symptoms.   Problem # 2:  SLEEP APNEA (ICD-780.57) Assessment: New  Orders: Sleep Disorder Referral (Sleep Disorder)  Problem # 3:  HYPOTHYROIDISM (ICD-244.9) Assessment: New  Orders: TLB-T3, Free (Triiodothyronine) (84481-T3FREE) TLB-T4 (Thyrox), Free [phone]) TLB-TSH (Thyroid Stimulating Hormone) (84443-TSH)  Labs Reviewed: TSH: 2.94 (01/23/2011)    HgBA1c: 6.0 (11/20/2010) Chol: 211 (11/20/2010)   HDL: 45.90 (11/20/2010)   TG: 198.0 (11/20/2010)  Problem # 4:  FATIGUE, ACUTE (ICD-780.79) Assessment: New  Orders: TLB-T3, Free (Triiodothyronine) (84481-T3FREE) TLB-T4 (Thyrox), Free [phone]) TLB-TSH (Thyroid Stimulating Hormone) (84443-TSH)  Problem # 5:  TOBACCO USE (ICD-305.1) Assessment: Comment Only  Encouraged smoking cessation and discussed different methods for smoking cessation.   Problem # 6:  PARESTHESIA (ICD-782.0) Assessment: Improved  Complete Medication List: 1)  Aspirin 81 Mg Tbec (Aspirin) .... One daily 2)  Pravachol 40 Mg Tabs (Pravastatin sodium) .... One daily in the evening 3)  Zolpidem Tartrate 5 Mg Tabs (Zolpidem tartrate) .... Take 1 tab by mouth at bedtime as needed sleep (has not picked up yet) 4)  Flonase 50 Mcg/act Susp (Fluticasone propionate) .... Two sprays once daily 5)  Loratadine 10 Mg Tabs (Loratadine) .... One by mouth once daily for allergies 6)  Zafirlukast 20 Mg Tabs (Zafirlukast) .... One two times a day 7)  Ventolin Hfa 108 (90 Base) Mcg/act Aers (Albuterol sulfate) .... Two puffs up to four times per day as needed  Patient Instructions: 1)  Please schedule a follow-up appointment in 2 weeks. 2)  It is  important that you exercise regularly at least 20 minutes 5 times a week. If you develop chest pain, have severe difficulty breathing, or feel very tired , stop exercising immediately and seek medical attention. 3)  You need to lose weight. Consider a lower calorie diet and regular exercise.  4)  Tobacco is very bad for your health and your loved ones! You Should stop smoking!. 5)  Stop Smoking Tips: Choose a Quit date. Cut down before the Quit date. decide what you will do as a substitute when you feel the urge to smoke(gum,toothpick,exercise).   Orders Added: 1)  TLB-T3, Free (Triiodothyronine) [44010-U7OZDG] 2)  TLB-T4 (Thyrox), Free [64403-KV4Q] 3)  TLB-TSH (Thyroid Stimulating Hormone) [84443-TSH] 4)  Sleep Disorder Referral [Sleep Disorder] 5)  Radiology Referral [Radiology] 6)  Est. Patient Level IV [59563]

## 2011-02-25 ENCOUNTER — Telehealth: Payer: Self-pay | Admitting: Internal Medicine

## 2011-02-25 ENCOUNTER — Encounter: Payer: Self-pay | Admitting: Cardiology

## 2011-02-25 ENCOUNTER — Ambulatory Visit (INDEPENDENT_AMBULATORY_CARE_PROVIDER_SITE_OTHER): Payer: BC Managed Care – PPO | Admitting: Cardiology

## 2011-02-25 DIAGNOSIS — R0609 Other forms of dyspnea: Secondary | ICD-10-CM

## 2011-02-25 DIAGNOSIS — R0989 Other specified symptoms and signs involving the circulatory and respiratory systems: Secondary | ICD-10-CM

## 2011-02-26 ENCOUNTER — Other Ambulatory Visit: Payer: Self-pay | Admitting: Internal Medicine

## 2011-02-26 DIAGNOSIS — I519 Heart disease, unspecified: Secondary | ICD-10-CM

## 2011-02-26 DIAGNOSIS — I10 Essential (primary) hypertension: Secondary | ICD-10-CM

## 2011-02-26 DIAGNOSIS — R0989 Other specified symptoms and signs involving the circulatory and respiratory systems: Secondary | ICD-10-CM

## 2011-02-26 DIAGNOSIS — M545 Low back pain: Secondary | ICD-10-CM

## 2011-02-26 DIAGNOSIS — G4710 Hypersomnia, unspecified: Secondary | ICD-10-CM

## 2011-02-26 DIAGNOSIS — G4730 Sleep apnea, unspecified: Secondary | ICD-10-CM

## 2011-02-26 DIAGNOSIS — R0609 Other forms of dyspnea: Secondary | ICD-10-CM

## 2011-02-26 DIAGNOSIS — IMO0002 Reserved for concepts with insufficient information to code with codable children: Secondary | ICD-10-CM

## 2011-02-27 ENCOUNTER — Telehealth: Payer: Self-pay | Admitting: Internal Medicine

## 2011-02-27 ENCOUNTER — Ambulatory Visit
Admission: RE | Admit: 2011-02-27 | Discharge: 2011-02-27 | Disposition: A | Discharge status: Home / Self Care | Payer: BC Managed Care – PPO | Source: Ambulatory Visit | Attending: Internal Medicine | Admitting: Internal Medicine

## 2011-02-27 DIAGNOSIS — IMO0002 Reserved for concepts with insufficient information to code with codable children: Secondary | ICD-10-CM

## 2011-02-27 DIAGNOSIS — M545 Low back pain: Secondary | ICD-10-CM

## 2011-03-03 NOTE — Progress Notes (Signed)
  Phone Note Call from Patient Call back at Home Phone [phone]   Caller: Patient--[phone] Call For: Etta Grandchild MD Summary of Call: Pt states she left letter & lab results from Dr Jennette Kettle. Pt states she needs thyroid medicine. Please advise Initial call taken by: Verdell Face,  February 25, 2011 9:49 AM  Follow-up for Phone Call        her repeat thryoid tests were normal. she does not have thyroid disease. Follow-up by: Etta Grandchild MD,  February 25, 2011 9:53 AM  Additional Follow-up for Phone Call Additional follow up Details #1::        pt requesting apnea test results as well, pt wants a call regarding both thyroid & this. Additional Follow-up by: Verdell Face,  February 25, 2011 10:47 AM    Additional Follow-up for Phone Call Additional follow up Details #2::    i have not received any sleep apnea results, she should talk to whomever did the sleep test Follow-up by: Etta Grandchild MD,  February 25, 2011 10:59 AM

## 2011-03-03 NOTE — Miscellaneous (Signed)
Summary: Sleep study  Clinical Lists Changes Split night study.  AHI 94.7.  PLMI 0.  Suboptimal titration.  Results d/w pt's husband over the phone.  Will proceed with scheduling full night CPAP/BPAP titration study, and then set up PAP therapy after review. Orders: Added new Referral order of Sleep Study (Sleep Study) - Signed

## 2011-03-03 NOTE — Assessment & Plan Note (Signed)
Summary: F1M/PER CHECKOUT/SF/RS PER PT CALL-MJ/AMD   Referring Provider:  Sanda Linger MD Primary Provider:  Etta Grandchild MD  CC:  office visit-pt not taking Pravachol x 3 weeks .  History of Present Illness: 60 yo with history of smoking, hyperlipidemia, leg pain, and dyspnea returns for followup.  Echo was done after last appointment showing normal LV systolic function but question of RA mass.  TEE showed that there was an infolding of the RA wall that was likely mistaken for a mass.  Patient had normal ABIs, so the leg pain is unlikely to be vascular.  She continues to have dyspnea after walking 75-100 feet.  She had PFTs with no significant airways obstruction.  She had a sleep study, but results are not yet available.   Labs (12/11): HDL 46, LDL 132, K 5.1, creatinine 0.8, TSH normal, BNP  Labs (1/12): TSH, free T4 normal  Current Medications (verified): 1)  Aspirin 81 Mg Tbec (Aspirin) .... One Daily 2)  Pravachol 40 Mg Tabs (Pravastatin Sodium) .... One Daily in The Evening-Out X 3 Weeks 3)  Zolpidem Tartrate 5 Mg Tabs (Zolpidem Tartrate) .... Take 1 Tab By Mouth At Bedtime As Needed Sleep (Has Not Picked Up Yet) 4)  Flonase 50 Mcg/act Susp (Fluticasone Propionate) .... Two Sprays Once Daily 5)  Loratadine 10 Mg Tabs (Loratadine) .... One By Mouth Once Daily For Allergies 6)  Zafirlukast 20 Mg Tabs (Zafirlukast) .... One Two Times A Day  Allergies (verified): No Known Drug Allergies  Past History:  Past Medical History: 1. HYPERLIPIDEMIA (ICD-272.4) 2. COLONIC POLYPS, ADENOMATOUS, HX OF (ICD-V12.72) 3. IRRITABLE BOWEL SYNDROME (ICD-564.1) 4. HEMORRHOIDS (ICD-455.6) 5. COLONIC INERTIA 6. Active smoker 7. OSA (suspected) 8. Obesity 9. Allergic rhinitis     - RAST positive, IgE 1569 from Jan 23, 2011 10. Dyspnea      - PFT 02/03/11>>FEV1 2.64(107%), FEV1% 76, TLC 5.44(103%), DLCO 67%, no BD      - Echo (1/12): EF 55-60%, mild LVH, grade I diastolic dysfunction, ? 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  11. Hypothyroidism  Family History: Reviewed history from 01/23/2011 and no changes required. Father - MI age 74 x 3, Allergies Brother - Prostate cancer  Social History: Reviewed history from 01/23/2011 and no changes required. Married.  Unemployed.  Started smoking age 57, and currently smokes 1/4 pack per day.  No significant alcohol use.  Drinks one cup of coffee per day.  Vital Signs:  Patient profile:   60 year old female Menstrual status:  hysterectomy Height:      66 inches Weight:      284 pounds Pulse rate:   98 / minute Pulse rhythm:   regular BP sitting:   108 / 70  (left arm) Cuff size:   large  Vitals Entered By: Judithe Modest CMA (February 25, 2011 12:31 PM)  Physical Exam  General:  Well developed, well nourished, in no acute distress.  Obese.  Neck:  Neck supple, no JVD. No masses, thyromegaly or abnormal cervical nodes. Lungs:  Clear bilaterally to auscultation and percussion. Heart:  Non-displaced PMI, chest non-tender; regular rate and rhythm, S1, S2 without murmurs, rubs or gallops. Carotid upstroke normal, no bruit. Pedals normal pulses. No edema, no varicosities. Abdomen:  Bowel sounds positive; abdomen soft and non-tender without masses, organomegaly, or hernias noted. No hepatosplenomegaly. Extremities:  No clubbing or cyanosis. Neurologic:  Alert and  oriented x 3. Psych:  Normal affect.   Impression & Recommendations:  Problem # 1:  DYSPNEA/SHORTNESS OF BREATH (ICD-786.09) Suspect that this is multifactorial: diastolic CHF, possible asthma, obesity deconditioning.  PFTs were unremarkable and echo showed normal LV systolic function.  Awaiting result of sleep study.   Problem # 2:  LEG PAIN Doubt this is vascular given normal ABIs.   Patient Instructions: 1)  Your physician recommends that you schedule a follow-up appointment as needed with Dr  Shirlee Latch. Prescriptions: PRAVACHOL 40 MG TABS (PRAVASTATIN SODIUM) one daily in the evening  #30 x 6   Entered by:   Katina Dung, RN, BSN   Authorized by:   Marca Ancona, MD   Signed by:   Katina Dung, RN, BSN on 02/25/2011   Method used:   Electronically to        [facility] # [phone]* (retail)       [address]       [place], [place]  [address]       Ph: [phone]       Fax: [phone]   RxID:   [id number]

## 2011-03-03 NOTE — Progress Notes (Signed)
Summary: med inquiry?  Phone Note Other Incoming   Caller: Jonasia Coiner, spouse [phone] Summary of Call: Caller stated that Pt had abnormal thyroid test results w/Dr Jennette Kettle and was requesting that Dr Yetta Barre medicate according to Dr Donnetta Hail results.? Informed caller that our test results are normal and we are not showing any condition to be treated. Caller is strongly suggesting that wife will develop thyroid problems due to family Hx. Suggested that we could mail out copy of our test results to compare w/Dr Neal's. Pt has appt w/ Dr Yetta Barre next week. Assured caller that all test results will be explained art that time. Initial call taken by: Burnard Leigh [facility]),  February 25, 2011 5:21 PM

## 2011-03-04 ENCOUNTER — Encounter: Payer: Self-pay | Admitting: Internal Medicine

## 2011-03-04 ENCOUNTER — Ambulatory Visit (INDEPENDENT_AMBULATORY_CARE_PROVIDER_SITE_OTHER): Payer: BC Managed Care – PPO | Admitting: Internal Medicine

## 2011-03-04 ENCOUNTER — Ambulatory Visit: Payer: BC Managed Care – PPO | Admitting: Internal Medicine

## 2011-03-04 VITALS — BP 114/70 | HR 90 | Temp 98.4°F | Resp 16 | Wt 283.0 lb

## 2011-03-04 DIAGNOSIS — M545 Low back pain: Secondary | ICD-10-CM

## 2011-03-04 DIAGNOSIS — E538 Deficiency of other specified B group vitamins: Secondary | ICD-10-CM

## 2011-03-04 DIAGNOSIS — Z23 Encounter for immunization: Secondary | ICD-10-CM

## 2011-03-04 DIAGNOSIS — M5416 Radiculopathy, lumbar region: Secondary | ICD-10-CM

## 2011-03-04 DIAGNOSIS — IMO0002 Reserved for concepts with insufficient information to code with codable children: Secondary | ICD-10-CM

## 2011-03-04 MED ORDER — PNEUMOCOCCAL VAC POLYVALENT 25 MCG/0.5ML IJ INJ: 0.5000 mL | INJECTION | Freq: Once | INTRAMUSCULAR | Status: DC

## 2011-03-04 MED ORDER — CYANOCOBALAMIN 1000 MCG/ML IJ SOLN
1000.0000 ug | Freq: Once | INTRAMUSCULAR | Status: AC
  Administered 2011-03-04: 1000 ug via INTRAMUSCULAR

## 2011-03-04 MED ORDER — TRAMADOL HCL 50 MG PO TABS: 50.0000 mg | ORAL_TABLET | Freq: Four times a day (QID) | ORAL | Status: DC | PRN

## 2011-03-04 NOTE — Progress Notes (Signed)
  Subjective:    Patient ID: ZILDA NO, female    DOB: 04-Oct-1951, 60 y.o.   MRN: [id number]  Back Pain This is a recurrent problem. The current episode started more than 1 month ago. The problem occurs intermittently. The problem has been waxing and waning since onset. The pain is present in the lumbar spine. The quality of the pain is described as stabbing and shooting. The pain radiates to the right thigh and left thigh. The pain is at a severity of 3/10. The pain is mild. The pain is worse during the day. The symptoms are aggravated by bending and standing. Stiffness is present all day. Pertinent negatives include no abdominal pain, bladder incontinence, bowel incontinence, chest pain, dysuria, headaches, leg pain, numbness, paresis, paresthesias, pelvic pain, perianal numbness, tingling, weakness or weight loss. Risk factors include obesity, poor posture and sedentary lifestyle. She has tried analgesics for the symptoms. The treatment provided no relief.      Review of Systems  Constitutional: Negative for weight loss, diaphoresis, activity change and appetite change.  Respiratory: Negative for cough, chest tightness, shortness of breath, wheezing and stridor.   Cardiovascular: Negative for chest pain, palpitations and leg swelling.  Gastrointestinal: Negative for abdominal pain, abdominal distention and bowel incontinence.  Genitourinary: Negative for bladder incontinence, dysuria and pelvic pain.  Musculoskeletal: Positive for back pain. Negative for joint swelling and gait problem.  Neurological: Negative for tingling, tremors, weakness, numbness, headaches and paresthesias.  Psychiatric/Behavioral: Negative for behavioral problems, confusion, self-injury, dysphoric mood and agitation. The patient is nervous/anxious. The patient is not hyperactive.        Objective:   Physical Exam  Constitutional: She is oriented to person, place, and time. She appears well-developed and  well-nourished. No distress.  HENT:  Head: Normocephalic and atraumatic.  Eyes: EOM are normal.  Neck: No thyromegaly present.  Cardiovascular: Regular rhythm and normal heart sounds.   No murmur heard. Pulmonary/Chest: No respiratory distress. She exhibits no tenderness.  Abdominal: She exhibits no distension and no mass. There is no tenderness. There is no rebound and no guarding.  Musculoskeletal: Normal range of motion. She exhibits no edema and no tenderness.       Right shoulder: She exhibits normal range of motion, no tenderness, no bony tenderness, no deformity, no pain, no spasm, normal pulse and normal strength.       Lumbar back: She exhibits normal range of motion, no tenderness, no bony tenderness, no swelling, no edema, no deformity, no laceration, no pain, no spasm and normal pulse.  Neurological: She is alert and oriented to person, place, and time. She has normal reflexes. She displays normal reflexes. No cranial nerve deficit. She exhibits normal muscle tone. Coordination normal.  Skin: Skin is dry. No rash noted. She is not diaphoretic. No erythema. No pallor.  Psychiatric: She has a normal mood and affect. Her behavior is normal. Judgment and thought content normal.          Assessment & Plan:

## 2011-03-04 NOTE — Assessment & Plan Note (Signed)
Refer to pain management for [facility]

## 2011-03-04 NOTE — Assessment & Plan Note (Signed)
B12 injection was given today.

## 2011-03-04 NOTE — Assessment & Plan Note (Signed)
Start tramadol and refer to pain mngt as her recent MRI showed a bulging disc at L5S1 with some nerve impingement, she tells me she wants to find out if an ESI will shrink the bulging disc

## 2011-03-04 NOTE — Patient Instructions (Signed)
Back Pain (Lumbosacral Strain) Back pain is one of the most common causes of pain. There are many causes of back pain. Most are not serious conditions.  CAUSES Your backbone (spinal column) is made up of 24 main vertebral bodies, the sacrum, and the coccyx. These are held together by muscles and tough, fibrous tissue (ligaments). Nerve roots pass through the openings between the vertebrae. A sudden move or injury to the back may cause injury to, or pressure on, these nerves. This may result in localized back pain or pain movement (radiation) into the buttocks, down the leg, and into the foot. Sharp, shooting pain from the buttock down the back of the leg (sciatica) is frequently associated with a ruptured (herniated) disc. Pain may be caused by muscle spasm alone. Your caregiver can often find the cause of your pain by the details of your symptoms and an exam. In some cases, you may need tests (such as X-rays). Your caregiver will work with you to decide if any tests are needed based on your specific exam. HOME CARE INSTRUCTIONS  Avoid an underactive lifestyle. Active exercise, as directed by your caregiver, is your greatest weapon against back pain.   Avoid hard physical activities (tennis, racquetball, water-skiing) if you are not in proper physical condition for it. This may aggravate and/or create problems.   If you have a back problem, avoid sports requiring sudden body movements. Swimming and walking are generally safer activities.   Maintain good posture.   Avoid becoming overweight (obese).   Use bed rest for only the most extreme, sudden (acute) episode. Your caregiver will help you determine how much bed rest is necessary.   For acute conditions, you may put ice on the injured area.   Put ice in a plastic bag.   Place a towel between your skin and the bag.   Leave the ice on for 20 minutes at a time, every 2 hours, or as needed.   After you are improved and more active, it may  help to apply heat for 30 minutes before activities.  See your caregiver if you are having pain that lasts longer than expected. Your caregiver can advise appropriate exercises and/or therapy if needed. With conditioning, most back problems can be avoided. SEEK IMMEDIATE MEDICAL CARE IF:  You have numbness, tingling, weakness, or problems with the use of your arms or legs.   You experience severe back pain not relieved with medicines.   There is a change in bowel or bladder control.   You have increasing pain in any area of the body, including your belly (abdomen).   You notice shortness of breath, dizziness, or feel faint.   You feel sick to your stomach (nauseous), are throwing up (vomiting), or become sweaty.   You notice discoloration of your toes or legs, or your feet get very cold.   Your back pain is getting worse.   You have an oral temperature above 100.5, not controlled by medicine.  MAKE SURE YOU:   Understand these instructions.   Will watch your condition.   Will get help right away if you are not doing well or get worse.  Document Released: 09/09/2005 Document Re-Released: 02/24/2010 [employer] Patient Information 2011 [employer]. 

## 2011-03-06 ENCOUNTER — Telehealth: Payer: Self-pay | Admitting: *Deleted

## 2011-03-06 DIAGNOSIS — IMO0002 Reserved for concepts with insufficient information to code with codable children: Secondary | ICD-10-CM

## 2011-03-06 NOTE — Telephone Encounter (Signed)
Patient informed, left vm for pt

## 2011-03-06 NOTE — Telephone Encounter (Signed)
Patient requesting referral to MD who can "fix" her herniated disc, not just get "pain pills" at pain clinic.

## 2011-03-10 ENCOUNTER — Ambulatory Visit: Payer: BC Managed Care – PPO | Admitting: Pulmonary Disease

## 2011-03-12 NOTE — Progress Notes (Signed)
  Phone Note Outgoing Call   Summary of Call: LA - I get her MRI report, please tell her that she has a bulging disc at L5S1 and it is presseing on a nerve, thanks TJ Initial call taken by: Etta Grandchild MD,  February 27, 2011 1:14 PM  Follow-up for Phone Call        Patient has appt on wednesday, would you like to discuss this with her at appt? Follow-up by: Etta Grandchild MD,  March 01, 2011 10:58 AM

## 2011-03-16 ENCOUNTER — Encounter: Payer: Self-pay | Admitting: Pulmonary Disease

## 2011-03-19 ENCOUNTER — Telehealth: Payer: Self-pay | Admitting: Internal Medicine

## 2011-03-19 NOTE — Telephone Encounter (Signed)
Spoke with patient's husband and he states the patient has a hx of constipation but it has gotten worse lately. Mineral oil is not helping. She is eating lots of fiber.She recently She has tried Dulcolax and Miralax daily. Suggested she increase to BID on the Miralax. She is scheduled for OV on 04/22/11.

## 2011-03-19 NOTE — Telephone Encounter (Signed)
reviewed and agree. 

## 2011-03-24 ENCOUNTER — Institutional Professional Consult (permissible substitution): Payer: BC Managed Care – PPO | Admitting: Pulmonary Disease

## 2011-03-26 ENCOUNTER — Ambulatory Visit (HOSPITAL_BASED_OUTPATIENT_CLINIC_OR_DEPARTMENT_OTHER): Payer: BC Managed Care – PPO

## 2011-03-31 ENCOUNTER — Encounter: Payer: Self-pay | Admitting: Pulmonary Disease

## 2011-04-01 ENCOUNTER — Telehealth: Payer: Self-pay | Admitting: Pulmonary Disease

## 2011-04-01 NOTE — Telephone Encounter (Signed)
LEFT VOICEMAIL MESSAGES FOR PATIENT AT 2:00 PM AND 3:20 PM, REQUESTING THAT HE RETURN MY CALL REGARDING PATIENT'S APPOINTMENT WITH DR. SOOD TOMORROW.

## 2011-04-01 NOTE — Telephone Encounter (Signed)
PATIENT'S HUSBAND RETURNED MY CALL.  I ADVISED HIM THAT DR. SOOD WOULD LIKE TO TALK TO PATIENT AT APPOINTMENT TOMORROW; HOWEVER, DR. SOOD DOES WANT PATIENT TO HAVE A SECOND SLEEP STUDY BEFORE HE PRESCRIBES TREATMENT.  PATIENT'S HUSBAND ASKED ME TO CANCEL APPOINTMENT TOMORROW, BECAUSE HE DOES NOT WANT TO INCUR ANY MORE CHARGES.

## 2011-04-02 ENCOUNTER — Institutional Professional Consult (permissible substitution): Payer: BC Managed Care – PPO | Admitting: Pulmonary Disease

## 2011-04-02 NOTE — Telephone Encounter (Signed)
Noted  

## 2011-04-08 ENCOUNTER — Ambulatory Visit: Payer: BC Managed Care – PPO

## 2011-04-09 ENCOUNTER — Ambulatory Visit: Payer: BC Managed Care – PPO

## 2011-04-17 ENCOUNTER — Ambulatory Visit: Payer: BC Managed Care – PPO | Admitting: Physical Medicine & Rehabilitation

## 2011-04-22 ENCOUNTER — Ambulatory Visit (INDEPENDENT_AMBULATORY_CARE_PROVIDER_SITE_OTHER): Payer: BC Managed Care – PPO | Admitting: Internal Medicine

## 2011-04-22 ENCOUNTER — Encounter: Payer: Self-pay | Admitting: Internal Medicine

## 2011-04-22 VITALS — BP 128/76 | HR 100 | Ht 64.0 in | Wt 283.0 lb

## 2011-04-22 DIAGNOSIS — K5902 Outlet dysfunction constipation: Secondary | ICD-10-CM

## 2011-04-22 MED ORDER — LACTULOSE 20 G PO PACK: 20.0000 g | PACK | ORAL | Status: AC

## 2011-04-22 NOTE — Progress Notes (Signed)
Sophia Kelley 24-Nov-1951 MRN [id number]        History of Present Illness:  This is a 60 year old white female with severe constipation and colonic inertia evaluated in the past  with the colonoscopy, Sitzmarks study. Status post rectocele repair by Dr. Jennette Kettle  in September 2008. Recurrent constipation requiring daily laxatives, MiraLax, mineral oil. Last colonoscopy January 2011 showed  hyperplastic polyp .She was just diagnosed with herniated lumbar disc. She was wondering if excessive straining might have contributed to her back problem   Past Medical History  Diagnosis Date  . Hyperlipidemia   . Hx of adenomatous colonic polyps   . IBS (irritable bowel syndrome)   . Hemorrhoids   . Colonic inertia   . Active smoker   . OSA (obstructive sleep apnea)     Suspected  . Obesity   . Allergic rhinitis     RAST positive, IgE 1569 from 01/23/2011  . Dyspnea     PFT 02/03/11>>FEV1 2.64(107%, FEV1% 76, TLC 5.44 (103%, DLCO 67%, no BD  . Hypothyroidism   . Vitamin B12 deficiency   . PVD (peripheral vascular disease)   . Hemorrhoids   . Lumbar radiculopathy   . Colonic inertia    Past Surgical History  Procedure Date  . Cesarean section     x 2  . Nasal septum surgery   . Abdominal hysterectomy     reports that she has been smoking.  She does not have any smokeless tobacco history on file. She reports that she drinks alcohol. She reports that she does not use illicit drugs. family history includes Allergies in her father; Heart attack (age of onset:48) in her father; and Prostate cancer in her brother. 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 New LSSpine/disc   problems limiting hert  mobility. We have discussed the importance of exercise  and high-fiber diet. She will continue on milk of magnesia 2 capfuls/day, at bedtime and start her on Kristalose 20 g  in the morning. She is up-to-date on her colonoscopy  History of colon polyps. Last colonoscopy January 2011 showed hyperplastic polyp. Recall colonoscopy 10 years  Morbid obesity. Patient trying to lose weight but has been unsuccessful   04/22/2011 Sophia Kelley

## 2011-04-22 NOTE — Patient Instructions (Signed)
Please pick up your Sophia Kelley prescription at the pharmacy.

## 2011-04-28 NOTE — Op Note (Signed)
NAMEBRITTANIA, Sophia Kelley               ACCOUNT NO.:  [id number]   MEDICAL RECORD NO.:  [id number]          PATIENT TYPE:  OIB   LOCATION:  9399                          FACILITY:  [facility]   PHYSICIAN:  Freddy Finner, M.D.   DATE OF BIRTH:  [birth year]   DATE OF PROCEDURE:  09/01/2007  DATE OF DISCHARGE:                               OPERATIVE REPORT   PREOPERATIVE DIAGNOSIS:  Second-degree rectocele.   POSTOPERATIVE DIAGNOSES:  1. Second-degree rectocele.  2. Cystic mass in rectovaginal septum which is resected and submitted      for histologic examination.   OPERATIVE PROCEDURE:  1. Posterior vaginal repair.  2. Repair of resection of cystic mass in rectovaginal septum.   ESTIMATED INTRAOPERATIVE BLOOD LOSS:  50 mL or less.   ANESTHESIA:  Spinal.   INTRAOPERATIVE COMPLICATIONS:  None.   The patient is a 60 year old who believes that her rectocele is  contributing to her bowel dysfunction which includes profound  constipation and what Dr. Juanda Chance believes to be irritable bowel syndrome  with constipation predominant.  She is admitted now for surgical  correction of that problem.   She is admitted on the morning of surgery.  She is given a bolus of  Ancef IV.  She is taken to the operating room, placed under spinal  anesthesia, placed in the dorsal lithotomy position using the [facility]  stirrup system.  Betadine prep of mons, perineum, and vagina was carried  out with scrub followed by solution.  Bladder was evacuated with a  sterile catheter.  Sterile dressings were applied.  The vaginal  introitus was grasped on either side with an Allis.  The mucosa  overlying the rectum was tented with an Allis and a sharp incision made  along the midline of the posterior vaginal wall.  Edges of the incision  were grasped; incision was developed for a distance of approximately 5  cm.  With very careful blunt and sharp dissection, the rectum was freed  from the overlying tissue.  An  unusual-appearing clear cystic structure  which was multiloculated was found in the posterior septum approximately  2-3 cm from the vaginal introitus.  This was initially thought to be  bowel, but digital exploration confirmed just a cystic lesion in this  location with no communication with the bowel.  The cystic lesion was  resected and submitted for histologic exam.  The dissection was  continued to free the rectum from the mucosa.  Plication sutures were  then used to reapproximate perirectal tissues and recreate the  rectovaginal septum in this location.  Monocryl 0 was the suture used.  The perineal body was also plicated with an interrupted 0 Monocryl.  The  incision was then closed in a running fashion with running locking  sutures along the posterior vaginal wall, deeper sutures in the perineal  body and a subcuticular stitch along the skin of the perineal body.  After completing the dissection and repair, the area was injected with  approximately 10 mL of 0.25% plain Marcaine.  The patient was given IV  Toradol and IM Toradol, 30 mg of  each.  She was taken to the recovery  room in good condition.  She will be kept in the hospital for  observation at least through the afternoon and perhaps until the next  day.      Freddy Finner, M.D.  Electronically Signed     WRN/MEDQ  D:  09/01/2007  T:  09/01/2007  Job:  [id number]

## 2011-04-28 NOTE — H&P (Signed)
NAMESHERON, TALLMAN               ACCOUNT NO.:  [id number]   MEDICAL RECORD NO.:  [id number]          PATIENT TYPE:  AMB   LOCATION:  SDC                           FACILITY:  [facility]   PHYSICIAN:  Freddy Finner, M.D.   DATE OF BIRTH:  1951/11/06   DATE OF ADMISSION:  09/01/2007  DATE OF DISCHARGE:                              HISTORY & PHYSICAL   ADMISSION DIAGNOSIS:  Symptomatic rectocele.   Patient is a 60 year old married white female, gravida 2, para 2, both  delivered by cesarean section, who had a total abdominal hysterectomy in  1990 for chronic pelvic pain and dysfunctional uterine bleeding.  In the  recent past, she has been treated for menopausal symptoms.  She has an  ongoing issue with chronic constipation and by diagnosis of Dr. Lina Sar, irritable bowel syndrome with constipation predominant.   In looking for reasons with her difficulty with stools, one finding has  been a rectocele, which is presumed to increase the amount of stool that  collects in the rectum, perhaps decreasing difficulty emptying or  evacuating her bowels.  She considers this a major problem, and she has  requested surgical intervention.  She is admitted at this time for that  purpose.   Her current review of systems are otherwise negative.  Her only chronic  medications are mineral oil, which she takes daily, laxatives, which she  takes as well as MiraLax.   She has no other known significant medical illnesses.  She does have a  history of headaches but is on no chronic medication.  She has had  previous surgical procedures, including a laparoscopy prior to the  conception of her second child.  The laparoscopy was in 1987.  At that  time, boggy enlargement of the uterus was noted and minimal adhesions.   She is not a cigarette smoker.  Does not use alcohol.  She has never had  a blood transfusion.   FAMILY HISTORY:  Noncontributory.   PHYSICAL EXAMINATION:  HEENT:  Grossly within normal  limits.  Thyroid gland is not palpably enlarged.  Blood pressure in the office 120/80.  CHEST:  Clear to auscultation.  HEART:  Normal sinus rhythm without murmurs, rubs or gallops.  BREASTS:  Normal.  No palpable masses.  No nipple discharge or skin  change.  ABDOMEN:  Obese.  She has diastasis recti.  There is no appreciable  organomegaly or palpable masses.  PELVIC:  Vaginal cuff is normal.  Anterior support is good.  She does  have a second-degree rectocele.  EXTREMITIES:  Without clubbing, cyanosis or edema.   ASSESSMENT:  Symptomatic rectocele.   PLAN:  Posterior vaginal repair.      Freddy Finner, M.D.  Electronically Signed     WRN/MEDQ  D:  08/31/2007  T:  08/31/2007  Job:  [id number]

## 2011-05-01 NOTE — Discharge Summary (Signed)
Sophia Kelley, Sophia Kelley               ACCOUNT NO.:  [id number]   MEDICAL RECORD NO.:  [id number]          PATIENT TYPE:  OIB   LOCATION:  9305                          [facility]ILITY:  [facility]   PHYSICIAN:  Freddy Finner, M.D.   DATE OF BIRTH:  06/24/1951   DATE OF ADMISSION:  09/01/2007  DATE OF DISCHARGE:  09/02/2007                               DISCHARGE SUMMARY   DISCHARGE DIAGNOSIS:  Symptomatic rectocele, benign subcutaneous cyst of  perineal body.   OPERATIVE PROCEDURE:  Posterior vaginal repair, excisional biopsy of  cystic structure and perineal body.   INTRAOPERATIVE AND POSTOPERATIVE COMPLICATIONS:  None.   DISPOSITION:  The patient was in satisfactory and improved condition on  the first postoperative day.  She was tolerating a regular diet.  She  was ambulating without difficulty, having adequate bowel and bladder  function.  She was discharged home with Percocet 5/325 to be taken as  needed for postoperative pain.  She is to avoid heavy lifting or vaginal  entry.  She is to return to the office in approximately 2 weeks for  postoperative followup.  She is to take a regular diet   HISTORY OF PRESENT ILLNESS:  Details of the present illness are recorded  in the admission note as are the physical findings which included a  rectocele as the only significant finding other than obesity.   LABORATORY DATA:  Laboratory data during the admission included a preop  CBC with hemoglobin of 15.9, normal prothrombin time, and PTT.  She had  a hemoglobin of 12.5 postoperatively.   HOSPITAL COURSE:  Following admission, she was taken to the operating  room where the above-described procedure was accomplished without  difficulty or intraoperative complications.  The only unusual finding  was a cystic structure in the perineal body which was examined  histologically and found to be a benign cyst.  Her postoperative course  was remarkable only for urinary retention initially which is not  uncommon following general anesthesia.  By the evening of the first  postoperative day, she had adequately resolved this issue and was  voiding adequate amounts with minimal residual, and she was discharged  home with disposition as noted above.      Freddy Finner, M.D.  Electronically Signed     WRN/MEDQ  D:  10/22/2007  T:  10/23/2007  Job:  [id number]

## 2011-05-01 NOTE — Letter (Signed)
January 20, 2007    Dr. Mechele Claude, Assoc. Professor of Medicine  Division of Gastroenterology - [facility]  [address]  [place], [place] [address]   RE:  Sophia Kelley, Sophia Kelley  MRN:  [id number]  /  DOB:  14-Jan-1951   Dear Dr. Dorita Fray,   Sophia Kelley is a 60 year old white female followed by [place] since 1991 for  chronic constipation.  She has known colonic inertia based on the  Sitzmarks study in 2001 showing 4 Sitzmarks retained mostly in the  rectum.  She also had suspicion for rectocele.  Her problems are mostly  with evacuation.  Her laxative regimen has varied.  She has done best  when she was on Zelnorm 6 mg twice a day in combination with MiraLax,  but since Zelnorm has been taken off the market she has been only on  combination of Dulcolax and mineral oil 2-3 tablespoons daily, as well  as MiraLax 17 g daily.  She has occasional abdominal pain, she is on a  high fiber diet, and despite this, patient has to use rectal and  abdominal pressure to evacuate.  The patient had a history of total  abdominal hysterectomy by Dr. Jennette Kettle.  She says other medical problems  include hyperlipidemia, obesity, and past history of smoking.   MEDICATIONS:  1. Mineral oil 2-3 tablespoons daily.  2. Over the counter laxatives.  3. MiraLax 17 grams daily.   PHYSICAL EXAMINATION:  Blood pressure 110/70, pulse 84, weight 239  pounds.  ABDOMINAL:  Showed obese abdomen which was soft, nontender,  nondistended.  No tympany on rectal exam and anoscopic exam the rectal tone was normal,  there were no external hemorrhoidal tags.  There was a small 1+  hemorrhoids, small amount of stool in the rectal ampulla which was  Hemoccult negative.   I would appreciate if you could see Ms. Dilworth for anorectal manometry  to further evaluate her pelvic dysfunction and to determine if she would  be a good candidate for rectocele repair.  I am repeating her Sitzmarks  today and putting her on Anusol C  suppository for her symptomatic  hemorrhoid.  She is going to continue on her laxative regimen and high  fiber diet.  I appreciate your help with this nice lady.    Sincerely,      Sophia Kelley. Sophia Chance, MD  Electronically Signed    DMB/MedQ  DD: 01/20/2007  DT: 01/20/2007  Job #: [id number]   CC:    W. Varney Baas, M.D.

## 2011-05-06 ENCOUNTER — Ambulatory Visit: Payer: BC Managed Care – PPO | Admitting: Internal Medicine

## 2011-05-26 ENCOUNTER — Ambulatory Visit (INDEPENDENT_AMBULATORY_CARE_PROVIDER_SITE_OTHER): Payer: BC Managed Care – PPO | Admitting: *Deleted

## 2011-05-26 DIAGNOSIS — E538 Deficiency of other specified B group vitamins: Secondary | ICD-10-CM

## 2011-05-26 MED ORDER — CYANOCOBALAMIN 1000 MCG/ML IJ SOLN
1000.0000 ug | Freq: Once | INTRAMUSCULAR | Status: AC
  Administered 2011-05-26: 1000 ug via INTRAMUSCULAR

## 2011-06-03 ENCOUNTER — Ambulatory Visit: Payer: BC Managed Care – PPO | Admitting: Internal Medicine

## 2011-06-05 ENCOUNTER — Ambulatory Visit
Admission: RE | Admit: 2011-06-05 | Discharge: 2011-06-05 | Disposition: A | Discharge status: Home / Self Care | Payer: BC Managed Care – PPO | Source: Ambulatory Visit | Attending: Physical Medicine and Rehabilitation | Admitting: Physical Medicine and Rehabilitation

## 2011-06-05 ENCOUNTER — Other Ambulatory Visit: Payer: Self-pay | Admitting: Physical Medicine and Rehabilitation

## 2011-06-05 DIAGNOSIS — R52 Pain, unspecified: Secondary | ICD-10-CM

## 2011-07-02 ENCOUNTER — Other Ambulatory Visit (INDEPENDENT_AMBULATORY_CARE_PROVIDER_SITE_OTHER): Payer: BC Managed Care – PPO

## 2011-07-02 ENCOUNTER — Ambulatory Visit (INDEPENDENT_AMBULATORY_CARE_PROVIDER_SITE_OTHER): Payer: BC Managed Care – PPO | Admitting: Internal Medicine

## 2011-07-02 ENCOUNTER — Encounter: Payer: Self-pay | Admitting: Internal Medicine

## 2011-07-02 ENCOUNTER — Ambulatory Visit (INDEPENDENT_AMBULATORY_CARE_PROVIDER_SITE_OTHER)
Admission: RE | Admit: 2011-07-02 | Discharge: 2011-07-02 | Disposition: A | Discharge status: Home / Self Care | Payer: BC Managed Care – PPO | Source: Ambulatory Visit | Attending: Internal Medicine | Admitting: Internal Medicine

## 2011-07-02 DIAGNOSIS — R7989 Other specified abnormal findings of blood chemistry: Secondary | ICD-10-CM

## 2011-07-02 DIAGNOSIS — E538 Deficiency of other specified B group vitamins: Secondary | ICD-10-CM

## 2011-07-02 DIAGNOSIS — F17200 Nicotine dependence, unspecified, uncomplicated: Secondary | ICD-10-CM

## 2011-07-02 DIAGNOSIS — R791 Abnormal coagulation profile: Secondary | ICD-10-CM

## 2011-07-02 DIAGNOSIS — R109 Unspecified abdominal pain: Secondary | ICD-10-CM

## 2011-07-02 DIAGNOSIS — M545 Low back pain, unspecified: Secondary | ICD-10-CM

## 2011-07-02 DIAGNOSIS — R0789 Other chest pain: Secondary | ICD-10-CM

## 2011-07-02 DIAGNOSIS — E785 Hyperlipidemia, unspecified: Secondary | ICD-10-CM

## 2011-07-02 DIAGNOSIS — E039 Hypothyroidism, unspecified: Secondary | ICD-10-CM

## 2011-07-02 DIAGNOSIS — K5909 Other constipation: Secondary | ICD-10-CM

## 2011-07-02 LAB — CBC WITH DIFFERENTIAL/PLATELET
Basophils Absolute: 0 10*3/uL (ref 0.0–0.1)
Basophils Relative: 0.5 % (ref 0.0–3.0)
Eosinophils Absolute: 0.1 10*3/uL (ref 0.0–0.7)
Lymphocytes Relative: 28.6 % (ref 12.0–46.0)
MCHC: 33.3 g/dL (ref 30.0–36.0)
Neutrophils Relative %: 63.9 % (ref 43.0–77.0)
RBC: 5 Mil/uL (ref 3.87–5.11)
WBC: 7.7 10*3/uL (ref 4.5–10.5)

## 2011-07-02 LAB — LIPID PANEL
Cholesterol: 188 mg/dL (ref 0–200)
LDL Cholesterol: 111 mg/dL — ABNORMAL HIGH (ref 0–99)
Total CHOL/HDL Ratio: 3
VLDL: 21.2 mg/dL (ref 0.0–40.0)

## 2011-07-02 LAB — URINALYSIS, ROUTINE W REFLEX MICROSCOPIC
Bilirubin Urine: NEGATIVE
Hgb urine dipstick: NEGATIVE
Ketones, ur: NEGATIVE
Total Protein, Urine: NEGATIVE
Urine Glucose: NEGATIVE

## 2011-07-02 LAB — BRAIN NATRIURETIC PEPTIDE: Pro B Natriuretic peptide (BNP): 22 pg/mL (ref 0.0–100.0)

## 2011-07-02 LAB — COMPREHENSIVE METABOLIC PANEL
ALT: 20 U/L (ref 0–35)
AST: 16 U/L (ref 0–37)
Albumin: 4.1 g/dL (ref 3.5–5.2)
BUN: 12 mg/dL (ref 6–23)
Calcium: 9.2 mg/dL (ref 8.4–10.5)
Chloride: 101 mEq/L (ref 96–112)
Potassium: 4.8 mEq/L (ref 3.5–5.1)

## 2011-07-02 MED ORDER — CELECOXIB 200 MG PO CAPS: 200.0000 mg | ORAL_CAPSULE | Freq: Every day | ORAL | Status: DC

## 2011-07-02 MED ORDER — CYANOCOBALAMIN 1000 MCG/ML IJ SOLN
1000.0000 ug | Freq: Once | INTRAMUSCULAR | Status: AC
  Administered 2011-07-02: 1000 ug via INTRAMUSCULAR

## 2011-07-02 NOTE — Progress Notes (Signed)
Subjective:    Patient ID: Sophia Kelley, female    DOB: 08/16/1951, 60 y.o.   MRN: [id number]  HPI She returns c/o a 2 week hx of sharp, crampy right lower chest wall and flank pain, she tells me that she had a very similar pain 10 years ago and was treated for pleurisy and got better.   Review of Systems  Constitutional: Negative for fever, chills, diaphoresis, activity change, appetite change, fatigue and unexpected weight change.  HENT: Negative for sore throat, facial swelling, trouble swallowing, neck pain, neck stiffness and voice change.   Eyes: Negative for photophobia and visual disturbance.  Respiratory: Negative for apnea, cough, choking, chest tightness, shortness of breath, wheezing and stridor.   Cardiovascular: Positive for chest pain. Negative for palpitations and leg swelling.  Gastrointestinal: Positive for constipation. Negative for nausea, vomiting, abdominal pain, diarrhea, blood in stool, abdominal distention, anal bleeding and rectal pain.  Genitourinary: Positive for flank pain (right). Negative for dysuria, urgency, frequency, hematuria, decreased urine volume, vaginal bleeding, vaginal discharge, enuresis, difficulty urinating, genital sores, vaginal pain, menstrual problem, pelvic pain and dyspareunia.  Musculoskeletal: Negative for myalgias, back pain, joint swelling, arthralgias and gait problem.  Skin: Negative for color change, pallor, rash and wound.  Neurological: Negative for dizziness, tremors, seizures, syncope, facial asymmetry, speech difficulty, weakness, light-headedness, numbness and headaches.  Hematological: Negative for adenopathy. Does not bruise/bleed easily.  Psychiatric/Behavioral: Negative for suicidal ideas, hallucinations, behavioral problems, confusion, sleep disturbance, self-injury, dysphoric mood, decreased concentration and agitation. The patient is not nervous/anxious and is not hyperactive.        Objective:   Physical Exam    Vitals reviewed. Constitutional: She is oriented to person, place, and time. She appears well-developed and well-nourished. No distress.  HENT:  Head: Normocephalic and atraumatic.  Right Ear: External ear normal.  Left Ear: External ear normal.  Nose: Nose normal.  Mouth/Throat: Oropharynx is clear and moist. No oropharyngeal exudate.  Eyes: Conjunctivae and EOM are normal. Pupils are equal, round, and reactive to light.  Neck: Normal range of motion. Neck supple. No JVD present. No tracheal deviation present. No thyromegaly present.  Cardiovascular: Normal rate, regular rhythm, S1 normal and S2 normal.  PMI is not displaced.  Exam reveals no gallop, no S3, no S4, no distant heart sounds and no friction rub.   No murmur heard.  No systolic murmur is present   No diastolic murmur is present  Pulses:      Carotid pulses are 1+ on the right side, and 1+ on the left side.      Radial pulses are 1+ on the right side, and 1+ on the left side.       Femoral pulses are 1+ on the right side, and 1+ on the left side.      Popliteal pulses are 1+ on the right side, and 1+ on the left side.       Dorsalis pedis pulses are 1+ on the right side, and 1+ on the left side.       Posterior tibial pulses are 1+ on the right side, and 1+ on the left side.  Pulmonary/Chest: Breath sounds normal. No accessory muscle usage or stridor. Not tachypneic. No respiratory distress. She has no decreased breath sounds. She has no wheezes. She has no rhonchi. She has no rales. Chest wall is not dull to percussion. She exhibits tenderness. She exhibits no mass, no bony tenderness, no laceration, no crepitus, no edema, no deformity and no  retraction.         Red line indicates reproducible ttp  Abdominal: Soft. Bowel sounds are normal. She exhibits no distension and no mass. There is no tenderness. There is no rebound and no guarding.  Musculoskeletal: Normal range of motion. She exhibits no edema and no tenderness.   Lymphadenopathy:    She has no cervical adenopathy.  Neurological: She is alert and oriented to person, place, and time. She has normal reflexes. She displays normal reflexes. No cranial nerve deficit. She exhibits normal muscle tone. Coordination normal.  Skin: Skin is warm and dry. No rash noted. She is not diaphoretic. No erythema. No pallor.  Psychiatric: She has a normal mood and affect. Her behavior is normal. Judgment and thought content normal.        Lab Results  Component Value Date   WBC 7.4 01/23/2011   HGB 15.0 01/23/2011   HCT 44.3 01/23/2011   PLT 188.0 01/23/2011   CHOL 211* 11/20/2010   TRIG 198.0* 11/20/2010   HDL 45.90 11/20/2010   LDLDIRECT 131.6 11/20/2010   ALT 19 01/23/2011   AST 17 01/23/2011   NA 139 01/23/2011   K 5.2* 01/23/2011   CL 105 01/23/2011   CREATININE 0.8 01/23/2011   BUN 11 01/23/2011   CO2 29 01/23/2011   TSH 4.24 02/20/2011   HGBA1C 6.0 11/20/2010    Assessment & Plan:

## 2011-07-02 NOTE — Patient Instructions (Signed)
Chest Pain (Nonspecific) It is often hard to give a specific diagnosis for the cause of chest pain. There is always a chance that your pain could be related to something serious, such as a heart attack or a blood clot in the lungs. You need to follow up with your caregiver for further evaluation. CAUSES  Heartburn.   Pneumonia or bronchitis.   Anxiety and stress.   Inflammation around your heart (pericarditis) or lung (pleuritis or pleurisy).   A blood clot in the lung.   A collapsed lung (pneumothorax). It can develop suddenly on its own (spontaneous pneumothorax) or from injury (trauma) to the chest.  The chest wall is composed of bones, muscles, and cartilage. Any of these can be the source of the pain.  The bones can be bruised by injury.   The muscles or cartilage can be strained by coughing or overwork.   The cartilage can be affected by inflammation and become sore (costochondritis).  DIAGNOSIS Lab tests or other studies, such as X-rays, an EKG, stress testing, or cardiac imaging, may be needed to find the cause of your pain.  TREATMENT  Treatment depends on what may be causing your chest pain. Treatment may include:   Acid blockers for heartburn.  Anti-inflammatory medicine.   Pain medicine for inflammatory conditions.  Antibiotics if an infection is present.    You may be advised to change lifestyle habits. This includes stopping smoking and avoiding caffeine and chocolate.   You may be advised to keep your head raised (elevated) when sleeping. This reduces the chance of acid going backward from your stomach into your esophagus.   Most of the time, nonspecific chest pain will improve within 2 to 3 days with rest and mild pain medicine.  HOME CARE INSTRUCTIONS  If antibiotics were prescribed, take the full amount even if you start to feel better.   For the next few days, avoid physical activities that bring on chest pain. Continue physical activities as directed.     Do not smoke cigarettes or drink alcohol until your symptoms are gone.   Only take over-the-counter or prescription medicine for pain, discomfort, or fever as directed by your caregiver.   Follow your caregiver's suggestions for further testing if your chest pain does not go away.   Keep any follow-up appointments you made. If you do not go to an appointment, you could develop lasting (chronic) problems with pain. If there is any problem keeping an appointment, you must call to reschedule.  SEEK MEDICAL CARE IF:  You think you are having problems from the medicine you are taking. Read your medicine instructions carefully.   Your chest pain does not go away, even after treatment.   You develop a rash with blisters on your chest.  SEEK IMMEDIATE MEDICAL CARE IF:  You have increased chest pain or pain that spreads to your arm, neck, jaw, back, or belly (abdomen).   You develop shortness of breath, an increasing cough, or you are coughing up blood.   You have severe back or abdominal pain, feel sick to your stomach (nauseous) or throw up (vomit).   You develop severe weakness, fainting, or chills.   You have an oral temperature above 100.5, not controlled by medicine.  THIS IS AN EMERGENCY. Do not wait to see if the pain will go away. Get medical help at once. Call your local emergency services   (911 in [place] Do not drive yourself to the hospital. MAKE SURE YOU:  Understand   these instructions.   Will watch your condition.   Will get help right away if you are not doing well or get worse.  Document Released: 09/09/2005 Document Re-Released: 02/24/2010 [employer] Patient Information 2011 [employer]. 

## 2011-07-03 ENCOUNTER — Encounter: Payer: Self-pay | Admitting: Internal Medicine

## 2011-07-03 DIAGNOSIS — R7989 Other specified abnormal findings of blood chemistry: Secondary | ICD-10-CM | POA: Insufficient documentation

## 2011-07-03 LAB — D-DIMER, QUANTITATIVE: D-Dimer, Quant: 0.61 ug/mL-FEU — ABNORMAL HIGH (ref 0.00–0.48)

## 2011-07-03 NOTE — Assessment & Plan Note (Signed)
I will check plain xrays to look for stool retention, stones, bone lesions, lung pathology and will check labs to look for renal impairment and hematuria

## 2011-07-03 NOTE — Assessment & Plan Note (Signed)
This returns mildly elevated so I spoke to her today and have asked her to get a CT-angio done to look for PE

## 2011-07-03 NOTE — Assessment & Plan Note (Signed)
This is slightly improved by her report

## 2011-07-03 NOTE — Assessment & Plan Note (Signed)
Labs and xrays ordered to look for causes, in the meantime I will give her celebrex to decrease the pain

## 2011-07-03 NOTE — Assessment & Plan Note (Signed)
I will check her TSH 

## 2011-07-03 NOTE — Assessment & Plan Note (Signed)
B12 injection was give today

## 2011-07-03 NOTE — Assessment & Plan Note (Signed)
No change 

## 2011-07-06 ENCOUNTER — Ambulatory Visit (INDEPENDENT_AMBULATORY_CARE_PROVIDER_SITE_OTHER)
Admission: RE | Admit: 2011-07-06 | Discharge: 2011-07-06 | Disposition: A | Discharge status: Home / Self Care | Payer: BC Managed Care – PPO | Source: Ambulatory Visit | Attending: Internal Medicine | Admitting: Internal Medicine

## 2011-07-06 DIAGNOSIS — R0789 Other chest pain: Secondary | ICD-10-CM

## 2011-07-06 DIAGNOSIS — R109 Unspecified abdominal pain: Secondary | ICD-10-CM

## 2011-07-06 DIAGNOSIS — R791 Abnormal coagulation profile: Secondary | ICD-10-CM

## 2011-07-06 DIAGNOSIS — R7989 Other specified abnormal findings of blood chemistry: Secondary | ICD-10-CM

## 2011-07-06 MED ORDER — IOHEXOL 300 MG/ML  SOLN
80.0000 mL | Freq: Once | INTRAMUSCULAR | Status: AC | PRN
  Administered 2011-07-06: 80 mL via INTRAVENOUS

## 2011-07-23 ENCOUNTER — Ambulatory Visit (INDEPENDENT_AMBULATORY_CARE_PROVIDER_SITE_OTHER): Payer: BC Managed Care – PPO | Admitting: Internal Medicine

## 2011-07-23 ENCOUNTER — Encounter: Payer: Self-pay | Admitting: Internal Medicine

## 2011-07-23 VITALS — BP 114/84 | HR 90 | Temp 98.3°F | Resp 16 | Wt 281.2 lb

## 2011-07-23 DIAGNOSIS — E039 Hypothyroidism, unspecified: Secondary | ICD-10-CM

## 2011-07-23 DIAGNOSIS — R0789 Other chest pain: Secondary | ICD-10-CM

## 2011-07-23 DIAGNOSIS — K5909 Other constipation: Secondary | ICD-10-CM

## 2011-07-23 DIAGNOSIS — E538 Deficiency of other specified B group vitamins: Secondary | ICD-10-CM

## 2011-07-23 DIAGNOSIS — Z1231 Encounter for screening mammogram for malignant neoplasm of breast: Secondary | ICD-10-CM

## 2011-07-23 MED ORDER — CYANOCOBALAMIN 1000 MCG/ML IJ SOLN
1000.0000 ug | Freq: Once | INTRAMUSCULAR | Status: AC
  Administered 2011-07-23: 1000 ug via INTRAMUSCULAR

## 2011-07-23 NOTE — Progress Notes (Signed)
Subjective:    Patient ID: Sophia Kelley, female    DOB: October 20, 1951, 60 y.o.   MRN: [id number]  Chest Pain  This is a recurrent problem. The current episode started more than 1 month ago. The onset quality is gradual. The problem occurs intermittently. The problem has been gradually improving. The pain is present in the lateral region (right lower lateral rib cage). The pain is at a severity of 3/10. The pain is mild. The quality of the pain is described as sharp and stabbing. The pain does not radiate. Associated symptoms include back pain (low back pain chronically). Pertinent negatives include no abdominal pain, claudication, cough, diaphoresis, dizziness, exertional chest pressure, fever, headaches, hemoptysis, irregular heartbeat, leg pain, lower extremity edema, malaise/fatigue, nausea, near-syncope, numbness, orthopnea, palpitations, PND, shortness of breath, sputum production, syncope, vomiting or weakness. The pain is aggravated by movement. She has tried NSAIDs for the symptoms. The treatment provided significant relief.  Pertinent negatives for past medical history include no seizures. Prior diagnostic workup includes chest x-ray (CT scan).      Review of Systems  Constitutional: Positive for unexpected weight change (weight gain). Negative for fever, chills, malaise/fatigue, diaphoresis, activity change, appetite change and fatigue.  HENT: Negative for sore throat, facial swelling, trouble swallowing, neck pain, neck stiffness and voice change.   Eyes: Negative.   Respiratory: Negative for apnea, cough, hemoptysis, sputum production, choking, chest tightness, shortness of breath, wheezing and stridor.   Cardiovascular: Positive for chest pain. Negative for palpitations, orthopnea, claudication, leg swelling, syncope, PND and near-syncope.  Gastrointestinal: Positive for constipation. Negative for nausea, vomiting, abdominal pain, diarrhea, blood in stool, abdominal distention and  rectal pain.  Genitourinary: Negative for dysuria, urgency, frequency, hematuria, flank pain, vaginal bleeding, vaginal discharge, enuresis, difficulty urinating and dyspareunia.  Musculoskeletal: Positive for back pain (low back pain chronically). Negative for myalgias, joint swelling, arthralgias and gait problem.  Skin: Negative for color change, pallor, rash and wound.  Neurological: Negative for dizziness, tremors, seizures, syncope, facial asymmetry, speech difficulty, weakness, light-headedness, numbness and headaches.  Hematological: Negative for adenopathy. Does not bruise/bleed easily.  Psychiatric/Behavioral: Negative for suicidal ideas, hallucinations, behavioral problems, confusion, sleep disturbance, self-injury, dysphoric mood, decreased concentration and agitation. The patient is nervous/anxious. The patient is not hyperactive.        Objective:   Physical Exam  Vitals reviewed. Constitutional: She is oriented to person, place, and time. She appears well-developed and well-nourished. No distress.  HENT:  Head: Normocephalic and atraumatic.  Right Ear: External ear normal.  Left Ear: External ear normal.  Nose: Nose normal.  Mouth/Throat: Oropharynx is clear and moist. No oropharyngeal exudate.  Eyes: Conjunctivae and EOM are normal. Pupils are equal, round, and reactive to light. Right eye exhibits no discharge. Left eye exhibits no discharge. No scleral icterus.  Neck: Normal range of motion. Neck supple. No JVD present. No tracheal deviation present. No thyromegaly present.  Cardiovascular: Normal rate, regular rhythm, normal heart sounds and intact distal pulses.  Exam reveals no gallop and no friction rub.   No murmur heard. Pulmonary/Chest: Effort normal and breath sounds normal. No stridor. Not tachypneic. No respiratory distress. She has no decreased breath sounds. She has no wheezes. She has no rhonchi. She has no rales. Chest wall is not dull to percussion. She  exhibits no mass, no tenderness, no bony tenderness, no laceration, no crepitus, no edema, no deformity, no swelling and no retraction.  Abdominal: Soft. Bowel sounds are normal. She exhibits no shifting dullness,  no distension and no mass. There is no hepatosplenomegaly. There is no tenderness. There is no rigidity, no rebound, no guarding, no CVA tenderness, no tenderness at McBurney's point and negative Murphy's sign. No hernia. Hernia confirmed negative in the ventral area.  Musculoskeletal: Normal range of motion. She exhibits no edema and no tenderness.  Lymphadenopathy:    She has no cervical adenopathy.  Neurological: She is alert and oriented to person, place, and time. She has normal reflexes. She displays normal reflexes. No cranial nerve deficit. She exhibits normal muscle tone. Coordination normal.  Skin: Skin is warm and dry. No rash noted. She is not diaphoretic. No erythema. No pallor.  Psychiatric: She has a normal mood and affect. Her behavior is normal. Judgment and thought content normal.      Lab Results  Component Value Date   WBC 7.7 07/02/2011   HGB 15.3* 07/02/2011   HCT 45.9 07/02/2011   PLT 191.0 07/02/2011   CHOL 188 07/02/2011   TRIG 106.0 07/02/2011   HDL 56.20 07/02/2011   LDLDIRECT 131.6 11/20/2010   ALT 20 07/02/2011   AST 16 07/02/2011   NA 139 07/02/2011   K 4.8 07/02/2011   CL 101 07/02/2011   CREATININE 0.8 07/02/2011   BUN 12 07/02/2011   CO2 29 07/02/2011   TSH 2.66 07/02/2011   HGBA1C 6.0 11/20/2010      Assessment & Plan:

## 2011-07-23 NOTE — Assessment & Plan Note (Signed)
CBC looks great.

## 2011-07-23 NOTE — Patient Instructions (Signed)

## 2011-07-23 NOTE — Assessment & Plan Note (Signed)
Recent TSH was normal 

## 2011-07-23 NOTE — Assessment & Plan Note (Signed)
This is musculoskeletal in nature

## 2011-07-23 NOTE — Assessment & Plan Note (Signed)
This is unchanged 

## 2011-08-20 ENCOUNTER — Ambulatory Visit: Payer: BC Managed Care – PPO

## 2011-09-21 ENCOUNTER — Ambulatory Visit: Payer: BC Managed Care – PPO

## 2011-09-24 ENCOUNTER — Ambulatory Visit (INDEPENDENT_AMBULATORY_CARE_PROVIDER_SITE_OTHER): Payer: BC Managed Care – PPO

## 2011-09-24 DIAGNOSIS — D518 Other vitamin B12 deficiency anemias: Secondary | ICD-10-CM

## 2011-09-24 DIAGNOSIS — D519 Vitamin B12 deficiency anemia, unspecified: Secondary | ICD-10-CM

## 2011-09-24 LAB — PROTIME-INR
INR: 0.9
Prothrombin Time: 12.5

## 2011-09-24 LAB — CBC
MCHC: 34.2
Platelets: 194
RBC: 4.06
RBC: 5.11
WBC: 12.3 — ABNORMAL HIGH
WBC: 7.9

## 2011-09-24 MED ORDER — CYANOCOBALAMIN 1000 MCG/ML IJ SOLN
1000.0000 ug | Freq: Once | INTRAMUSCULAR | Status: AC
  Administered 2011-09-24: 1000 ug via INTRAMUSCULAR

## 2011-10-06 ENCOUNTER — Ambulatory Visit: Payer: BC Managed Care – PPO | Admitting: Pulmonary Disease

## 2011-10-06 ENCOUNTER — Ambulatory Visit (INDEPENDENT_AMBULATORY_CARE_PROVIDER_SITE_OTHER): Payer: BC Managed Care – PPO | Admitting: General Surgery

## 2011-10-09 ENCOUNTER — Ambulatory Visit: Payer: BC Managed Care – PPO | Admitting: Pulmonary Disease

## 2011-10-26 ENCOUNTER — Ambulatory Visit: Payer: BC Managed Care – PPO | Admitting: *Deleted

## 2011-10-27 ENCOUNTER — Ambulatory Visit (INDEPENDENT_AMBULATORY_CARE_PROVIDER_SITE_OTHER): Payer: BC Managed Care – PPO | Admitting: General Surgery

## 2011-11-26 ENCOUNTER — Ambulatory Visit (INDEPENDENT_AMBULATORY_CARE_PROVIDER_SITE_OTHER): Payer: BC Managed Care – PPO

## 2011-11-26 DIAGNOSIS — E538 Deficiency of other specified B group vitamins: Secondary | ICD-10-CM

## 2011-11-26 MED ORDER — CYANOCOBALAMIN 1000 MCG/ML IJ SOLN
1000.0000 ug | Freq: Once | INTRAMUSCULAR | Status: DC
  Administered 2011-11-26: 1000 ug via INTRAMUSCULAR

## 2011-11-26 MED ORDER — CYANOCOBALAMIN 1000 MCG/ML IJ SOLN
1000.0000 ug | Freq: Once | INTRAMUSCULAR | Status: AC
  Administered 2012-04-04: 1000 ug via INTRAMUSCULAR

## 2011-12-21 IMAGING — CT CT ANGIO CHEST
2 of 6 series · 19 of 36 positions shown · IV contrast (Omnipaque 300)
Comparison: Chest x-rays dated 07/02/2011 and 11/20/2010

CLINICAL DATA: Right-sided chest pain.  Elevated D-dimer.

CT ANGIOGRAPHY CHEST WITH CONTRAST
TECHNIQUE: Multidetector CT imaging of the chest was performed
using the standard protocol during bolus administration of
intravenous contrast.  Multiplanar CT image reconstructions
including MIPs were obtained to evaluate the vascular anatomy.
Contrast:  80 ml Mmnipaque-E99

[Series 6: thins (id) / (id) · axial · 0.75mm/px · z∈[-299,-43]mm · 18 of 286 slices shown]
[im 15/286  lung]
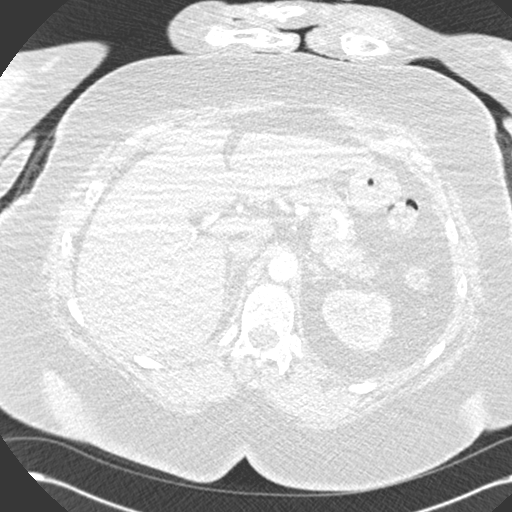
[im 29/286  mediastinal]
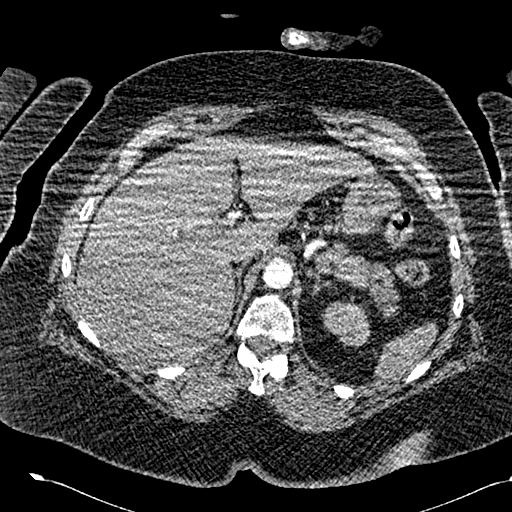
[im 43/286  lung]
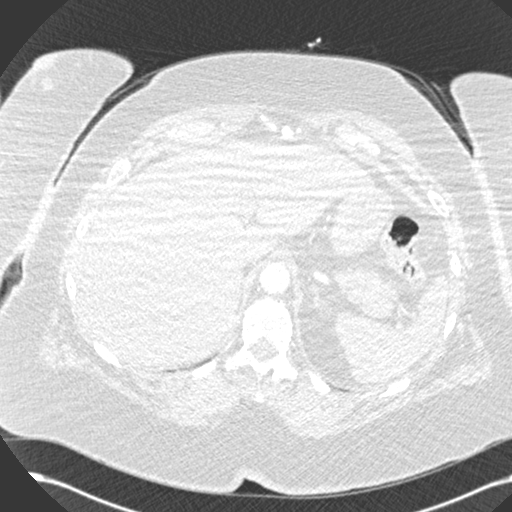
[im 58/286  mediastinal]
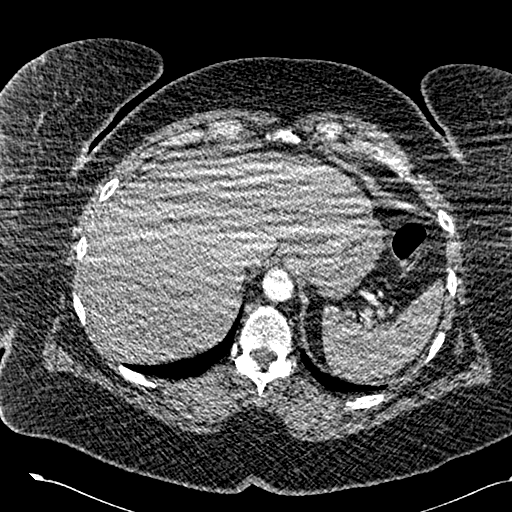
[im 72/286  lung]
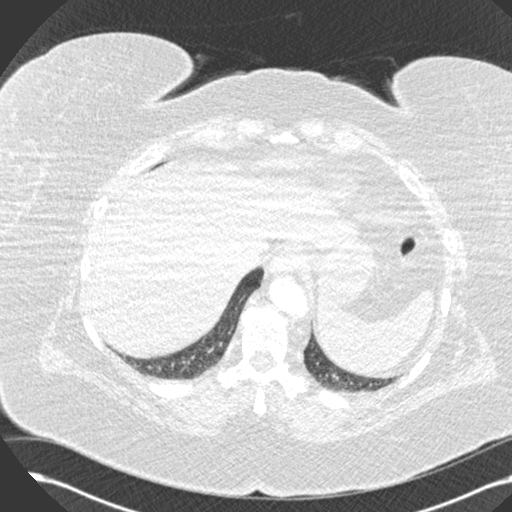
[im 86/286  mediastinal]
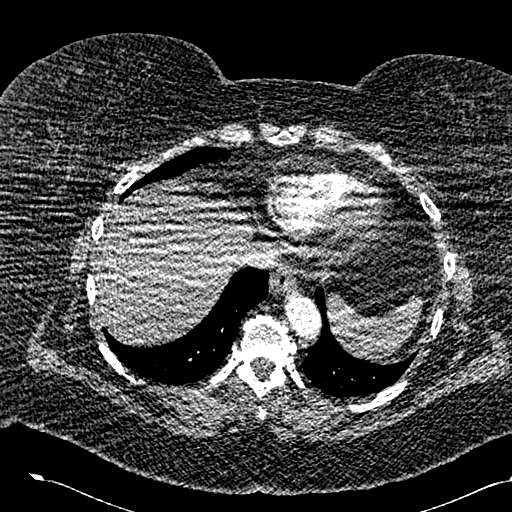
[im 100/286  lung]
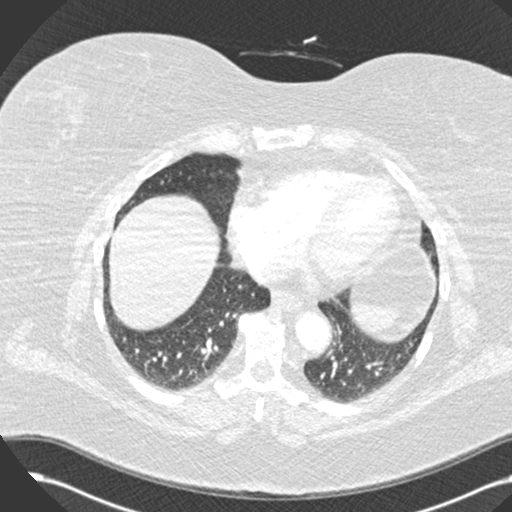
[im 115/286  mediastinal]
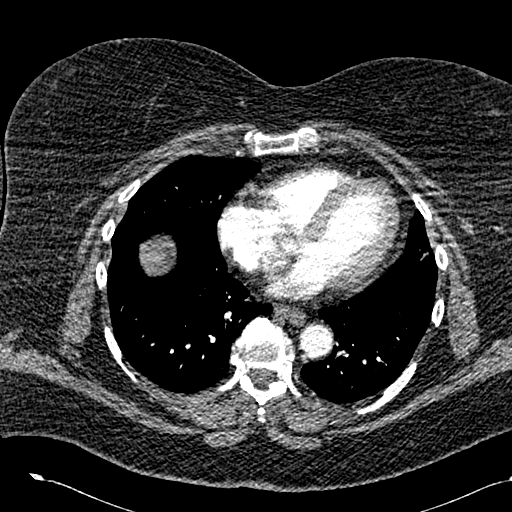
[im 129/286  lung]
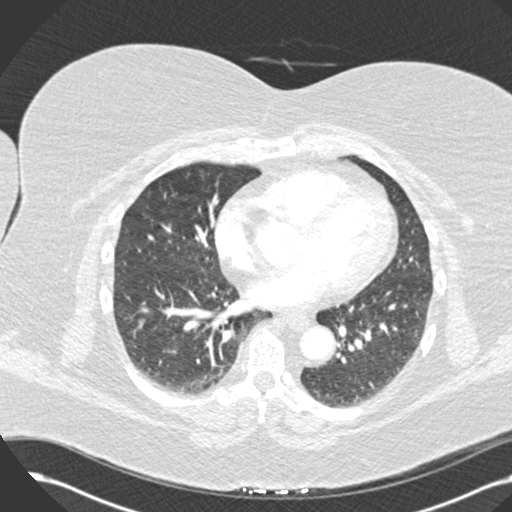
[im 157/286  mediastinal]
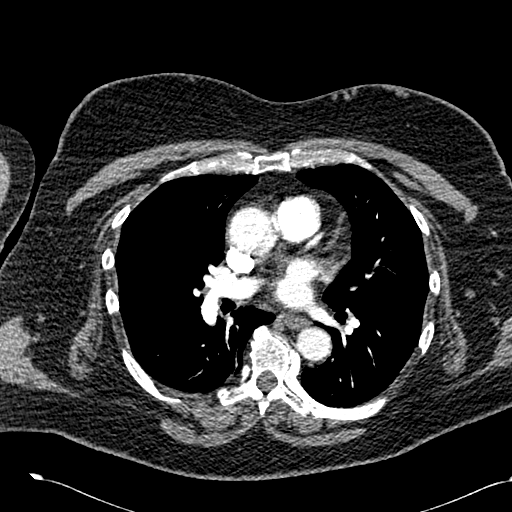
[im 172/286  lung]
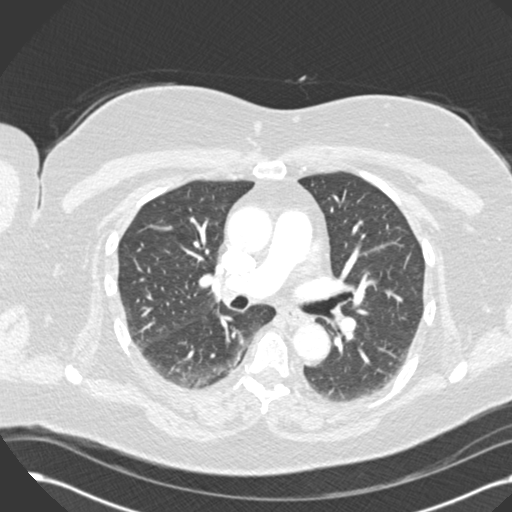
[im 186/286  mediastinal]
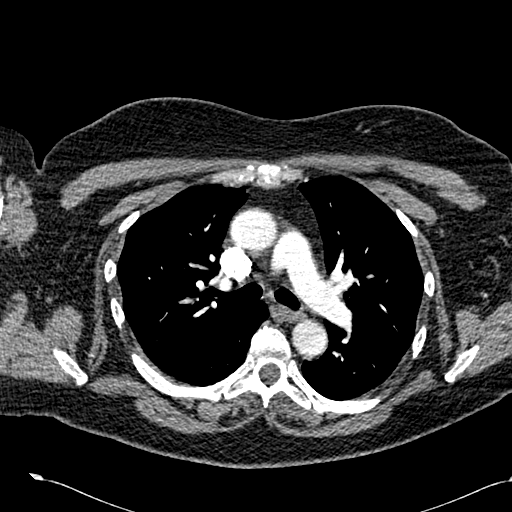
[im 200/286  lung]
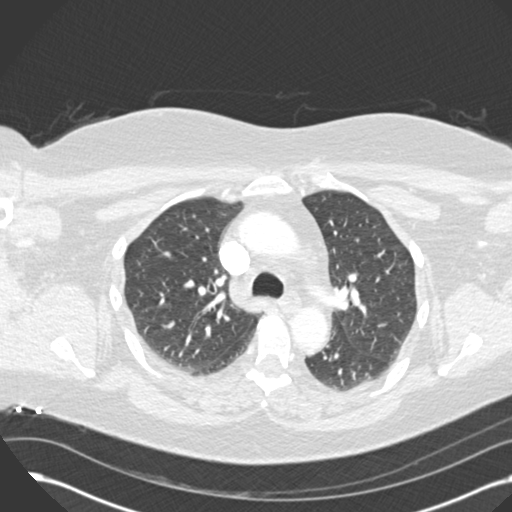
[im 214/286  mediastinal]
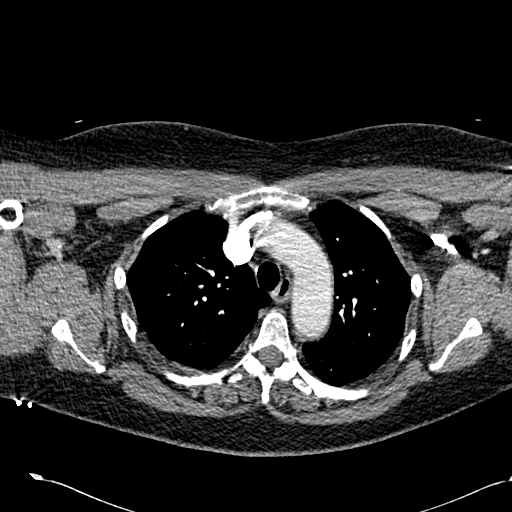
[im 229/286  lung]
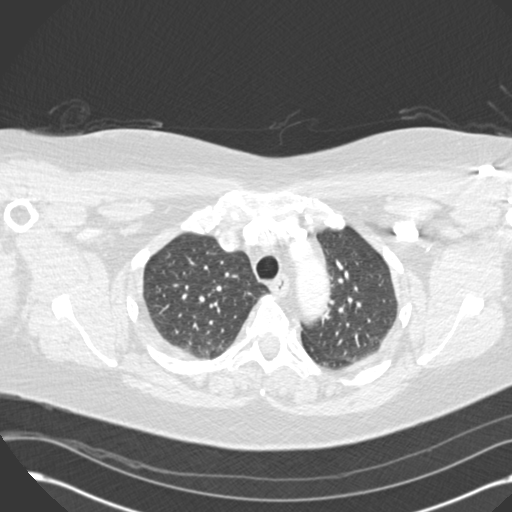
[im 243/286  mediastinal]
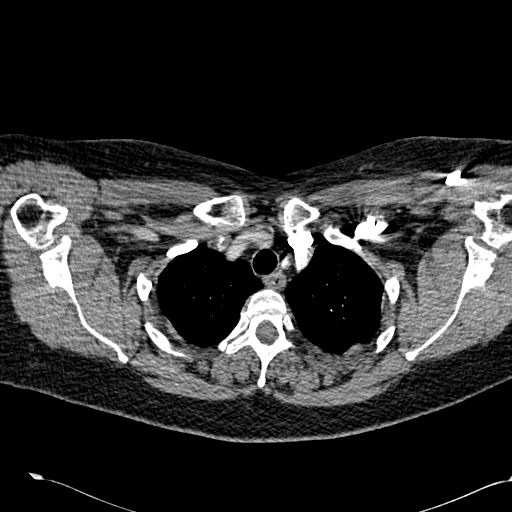
[im 257/286  lung]
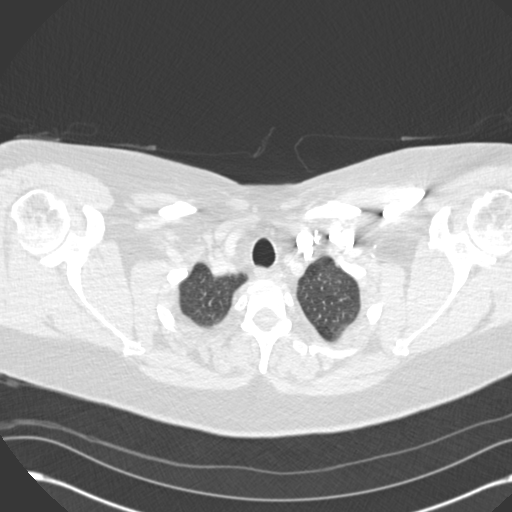
[im 271/286  mediastinal]
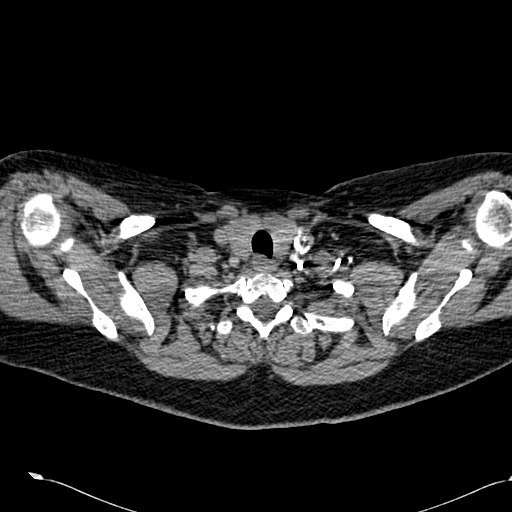

[Series 602: cor mpr · coronal · 0.75mm/px · 1 of 96 slices shown]
[im 48/96  mediastinal]
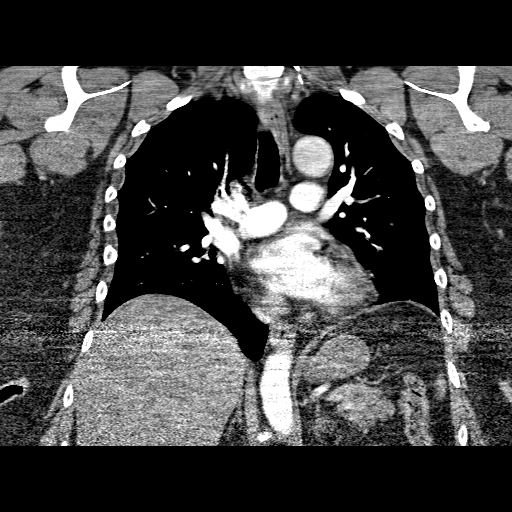

[19 of 36 positions shown; findings below may reference images not displayed]

FINDINGS: There are no pulmonary emboli, infiltrates, or
effusions.  Heart size and vascularity are normal.  No adenopathy.
No significant osseous abnormality.  Tiny calcified granuloma at
the right lung base posteriorly.

Review of the MIP images confirms the above findings.
IMPRESSION: 1.  No pulmonary emboli.
2.  No significant abnormalities.

## 2011-12-24 ENCOUNTER — Ambulatory Visit (INDEPENDENT_AMBULATORY_CARE_PROVIDER_SITE_OTHER): Payer: BC Managed Care – PPO

## 2011-12-24 DIAGNOSIS — E538 Deficiency of other specified B group vitamins: Secondary | ICD-10-CM

## 2011-12-24 MED ORDER — CYANOCOBALAMIN 1000 MCG/ML IJ SOLN
1000.0000 ug | Freq: Once | INTRAMUSCULAR | Status: AC
  Administered 2011-12-24: 1000 ug via INTRAMUSCULAR

## 2011-12-28 ENCOUNTER — Other Ambulatory Visit: Payer: Self-pay | Admitting: Neurosurgery

## 2011-12-28 DIAGNOSIS — M545 Low back pain: Secondary | ICD-10-CM

## 2012-01-02 ENCOUNTER — Other Ambulatory Visit: Payer: BC Managed Care – PPO

## 2012-01-15 ENCOUNTER — Ambulatory Visit (INDEPENDENT_AMBULATORY_CARE_PROVIDER_SITE_OTHER): Payer: BC Managed Care – PPO | Admitting: General Surgery

## 2012-01-15 ENCOUNTER — Encounter (INDEPENDENT_AMBULATORY_CARE_PROVIDER_SITE_OTHER): Payer: Self-pay | Admitting: General Surgery

## 2012-01-15 VITALS — BP 130/82 | HR 70 | Temp 97.3°F | Resp 18 | Ht 67.0 in | Wt 289.8 lb

## 2012-01-15 DIAGNOSIS — R109 Unspecified abdominal pain: Secondary | ICD-10-CM

## 2012-01-15 DIAGNOSIS — R1030 Lower abdominal pain, unspecified: Secondary | ICD-10-CM

## 2012-01-15 NOTE — Patient Instructions (Signed)
We will call you if we think we need to schedule a CT scan.

## 2012-01-15 NOTE — Progress Notes (Signed)
Patient ID: Sophia Kelley, female   DOB: 1951/05/02, 61 y.o.   MRN: [id number]  Chief Complaint  Patient presents with  . New Evaluation    Inguinal hernia    HPI Sophia Kelley is a 61 y.o. female.   HPI She is referred by Dr. Jennette Kelley for evaluation of an inguinal hernia.  She has colonic inertia and when she strains to have a bowel movement she has some pain in her groin. She also has a herniated lumbar disc. She has not noted asymmetry in the groin area or swelling.  Past Medical History  Diagnosis Date  . Hyperlipidemia   . Hx of adenomatous colonic polyps   . IBS (irritable bowel syndrome)   . Hemorrhoids   . Colonic inertia   . Active smoker   . OSA (obstructive sleep apnea)     Suspected  . Obesity   . Allergic rhinitis     RAST positive, IgE 1569 from 01/23/2011  . Dyspnea     PFT 02/03/11>>FEV1 2.64(107%, FEV1% 76, TLC 5.44 (103%, DLCO 67%, no BD  . Hypothyroidism   . Vitamin B12 deficiency   . PVD (peripheral vascular disease)   . Hemorrhoids   . Lumbar radiculopathy   . Colonic inertia   . Arthritis   . COPD (chronic obstructive pulmonary disease)   . Osteoporosis   . Nasal congestion   . Trouble swallowing   . Cough   . Rash   . Constipation   . Visual disturbance   . Hernia     lower back    Past Surgical History  Procedure Date  . Cesarean section     x 2  . Nasal septum surgery   . Abdominal hysterectomy 1990    Family History  Problem Relation Age of Onset  . Heart attack Father 48    x3  . Allergies Father   . Prostate cancer Brother   . Cancer Brother     prostate    Social History History  Substance Use Topics  . Smoking status: Current Everyday Smoker -- 0.3 packs/day for 37 years  . Smokeless tobacco: Never Used  . Alcohol Use: 1.2 oz/week    2 Shots of liquor per week    No Known Allergies  Current Outpatient Prescriptions  Medication Sig Dispense Refill  . aspirin 81 MG EC tablet Take 81 mg by mouth daily.        .  mineral oil liquid Take 30 mLs by mouth daily as needed.         Current Facility-Administered Medications  Medication Dose Route Frequency Provider Last Rate Last Dose  . cyanocobalamin ((VITAMIN B-12)) injection 1,000 mcg  1,000 mcg Intramuscular Once Etta Grandchild, MD      . pneumococcal 23 valent vaccine (PNU-IMMUNE) injection 0.5 mL  0.5 mL Intramuscular Once Etta Grandchild, MD        Review of Systems Review of Systems  Constitutional: Negative for fever and chills.  HENT: Positive for congestion.   Respiratory: Positive for cough.   Gastrointestinal:       Chronic constipation  Musculoskeletal: Positive for back pain.    Blood pressure 130/82, pulse 70, temperature 97.3 F (36.3 C), temperature source Temporal, resp. rate 18, height 5\' 7"  (1.702 m), weight 289 lb 12.8 oz (131.452 kg).  Physical Exam Physical Exam  Constitutional: No distress.       Morbidly obese female.  Abdominal: Soft. She exhibits no mass. There is no  tenderness.       Morbidly obese.  Lower transverse incision without evidence of hernia.  Genitourinary:       No inguinal bulges or evidence of hernia with a Valsalva maneuver.    Data Reviewed None.  Assessment    Groin pain especially when she strains to have a bowel movement. No evidence of inguinal hernia on physical exam. Her pain certainly can be from straining or could be referred pain from her herniated lumbar disc.    Plan    We'll review Dr. Donnetta Hail notes and if I think that she needs a CT scan I will call her back and schedule it.       Sophia Kelley J 01/15/2012, 2:34 PM

## 2012-01-25 ENCOUNTER — Ambulatory Visit: Payer: BC Managed Care – PPO

## 2012-02-03 ENCOUNTER — Ambulatory Visit (INDEPENDENT_AMBULATORY_CARE_PROVIDER_SITE_OTHER): Payer: BC Managed Care – PPO | Admitting: *Deleted

## 2012-02-03 DIAGNOSIS — E538 Deficiency of other specified B group vitamins: Secondary | ICD-10-CM

## 2012-02-03 MED ORDER — CYANOCOBALAMIN 1000 MCG/ML IJ SOLN
1000.0000 ug | Freq: Once | INTRAMUSCULAR | Status: AC
  Administered 2012-02-03: 1000 ug via INTRAMUSCULAR

## 2012-02-03 MED ORDER — CYANOCOBALAMIN 1000 MCG/ML IJ SOLN: 1000.0000 ug | Freq: Once | INTRAMUSCULAR | Status: DC

## 2012-02-26 ENCOUNTER — Telehealth: Payer: Self-pay | Admitting: Pulmonary Disease

## 2012-02-26 NOTE — Telephone Encounter (Signed)
I spoke with spouse and he stated that he wanted to have pt set up with a cpap mask. He states pt has already had one sleep study done and VS wanted pt to have a second one done but can't do it. i have scheduled pt to come in and discuss this with VS on Monday at 2:00. Nothing further was needed

## 2012-02-29 ENCOUNTER — Ambulatory Visit: Payer: BC Managed Care – PPO | Admitting: Pulmonary Disease

## 2012-03-02 ENCOUNTER — Ambulatory Visit (INDEPENDENT_AMBULATORY_CARE_PROVIDER_SITE_OTHER): Payer: BC Managed Care – PPO

## 2012-03-02 DIAGNOSIS — E538 Deficiency of other specified B group vitamins: Secondary | ICD-10-CM

## 2012-03-02 MED ORDER — CYANOCOBALAMIN 1000 MCG/ML IJ SOLN: 1000.0000 ug | Freq: Once | INTRAMUSCULAR | Status: DC

## 2012-03-07 ENCOUNTER — Telehealth: Payer: Self-pay

## 2012-03-07 NOTE — Telephone Encounter (Signed)
She needs to be seen.

## 2012-03-07 NOTE — Telephone Encounter (Signed)
Patient husband called [facility] requesting a referral to Dr Azucena Fallen [facility]) for second opinion back pain consult

## 2012-03-08 NOTE — Telephone Encounter (Signed)
Returned call to pt//[facility] need appt and please call back to set up, closing phone note.

## 2012-03-24 ENCOUNTER — Ambulatory Visit: Payer: BC Managed Care – PPO | Admitting: Pulmonary Disease

## 2012-04-04 ENCOUNTER — Ambulatory Visit (INDEPENDENT_AMBULATORY_CARE_PROVIDER_SITE_OTHER): Payer: BC Managed Care – PPO

## 2012-04-04 DIAGNOSIS — E538 Deficiency of other specified B group vitamins: Secondary | ICD-10-CM

## 2012-05-05 ENCOUNTER — Ambulatory Visit: Payer: BC Managed Care – PPO

## 2012-06-07 ENCOUNTER — Ambulatory Visit (INDEPENDENT_AMBULATORY_CARE_PROVIDER_SITE_OTHER): Payer: BC Managed Care – PPO | Admitting: *Deleted

## 2012-06-07 DIAGNOSIS — E538 Deficiency of other specified B group vitamins: Secondary | ICD-10-CM

## 2012-06-07 MED ORDER — CYANOCOBALAMIN 1000 MCG/ML IJ SOLN
1000.0000 ug | Freq: Once | INTRAMUSCULAR | Status: AC
  Administered 2012-06-07: 1000 ug via INTRAMUSCULAR

## 2012-07-07 ENCOUNTER — Ambulatory Visit (INDEPENDENT_AMBULATORY_CARE_PROVIDER_SITE_OTHER): Payer: BC Managed Care – PPO | Admitting: *Deleted

## 2012-07-07 DIAGNOSIS — E538 Deficiency of other specified B group vitamins: Secondary | ICD-10-CM

## 2012-07-07 MED ORDER — CYANOCOBALAMIN 1000 MCG/ML IJ SOLN
1000.0000 ug | Freq: Once | INTRAMUSCULAR | Status: AC
  Administered 2012-07-07: 1000 ug via INTRAMUSCULAR

## 2012-08-11 ENCOUNTER — Ambulatory Visit (INDEPENDENT_AMBULATORY_CARE_PROVIDER_SITE_OTHER): Payer: BC Managed Care – PPO | Admitting: *Deleted

## 2012-08-11 DIAGNOSIS — E538 Deficiency of other specified B group vitamins: Secondary | ICD-10-CM

## 2012-08-11 MED ORDER — CYANOCOBALAMIN 1000 MCG/ML IJ SOLN
1000.0000 ug | Freq: Once | INTRAMUSCULAR | Status: AC
  Administered 2012-08-11: 1000 ug via INTRAMUSCULAR

## 2012-09-13 ENCOUNTER — Ambulatory Visit (INDEPENDENT_AMBULATORY_CARE_PROVIDER_SITE_OTHER): Payer: BC Managed Care – PPO

## 2012-09-13 DIAGNOSIS — E538 Deficiency of other specified B group vitamins: Secondary | ICD-10-CM

## 2012-09-13 MED ORDER — CYANOCOBALAMIN 1000 MCG/ML IJ SOLN
1000.0000 ug | Freq: Once | INTRAMUSCULAR | Status: AC
  Administered 2012-09-13: 1000 ug via INTRAMUSCULAR

## 2012-10-11 ENCOUNTER — Ambulatory Visit: Payer: BC Managed Care – PPO

## 2012-10-18 ENCOUNTER — Ambulatory Visit (INDEPENDENT_AMBULATORY_CARE_PROVIDER_SITE_OTHER): Payer: BC Managed Care – PPO

## 2012-10-18 DIAGNOSIS — E538 Deficiency of other specified B group vitamins: Secondary | ICD-10-CM

## 2012-10-18 MED ORDER — CYANOCOBALAMIN 1000 MCG/ML IJ SOLN
1000.0000 ug | Freq: Once | INTRAMUSCULAR | Status: AC
  Administered 2012-10-18: 1000 ug via INTRAMUSCULAR

## 2012-11-14 ENCOUNTER — Other Ambulatory Visit: Payer: Self-pay | Admitting: Internal Medicine

## 2012-11-14 NOTE — Telephone Encounter (Signed)
I have a note in to Dr Juanda Chance regarding this. Patient has not taken [facility] in quite some time. I will address the rx after Dr Juanda Chance advises.

## 2012-11-15 ENCOUNTER — Telehealth: Payer: Self-pay | Admitting: *Deleted

## 2012-11-15 MED ORDER — LACTULOSE 20 G PO PACK: 20.0000 g | PACK | ORAL | Status: DC

## 2012-11-15 NOTE — Telephone Encounter (Signed)
rx sent

## 2012-11-15 NOTE — Telephone Encounter (Signed)
Message copied by Richardson Chiquito on Tue Nov 15, 2012  8:40 AM ------      Message from: Hart Carwin      Created: Mon Nov 14, 2012  8:35 PM       OK to refill Ronnette Juniper x 6      ----- Message -----         From: Richardson Chiquito, CMA         Sent: 11/14/2012   3:29 PM           To: Hart Carwin, MD            Patient is requesting refills of Kristalose. However, it does not appear she has gotten refills for about 1 year. Patient takes for constipation/obstipation.... Are you okay with me restarting the [facility] for her?

## 2013-03-21 ENCOUNTER — Ambulatory Visit: Payer: BC Managed Care – PPO | Admitting: Pulmonary Disease

## 2013-04-14 ENCOUNTER — Encounter: Payer: Self-pay | Admitting: Internal Medicine

## 2013-04-14 ENCOUNTER — Ambulatory Visit (INDEPENDENT_AMBULATORY_CARE_PROVIDER_SITE_OTHER): Payer: BC Managed Care – PPO | Admitting: Internal Medicine

## 2013-04-14 VITALS — BP 120/90 | HR 86 | Ht 66.0 in | Wt 282.0 lb

## 2013-04-14 DIAGNOSIS — K5900 Constipation, unspecified: Secondary | ICD-10-CM

## 2013-04-14 DIAGNOSIS — K5989 Other specified functional intestinal disorders: Secondary | ICD-10-CM

## 2013-04-14 MED ORDER — MOVIPREP 100 G PO SOLR: 1.0000 | Freq: Once | ORAL | Status: DC

## 2013-04-14 MED ORDER — LINACLOTIDE 145 MCG PO CAPS: 145.0000 ug | ORAL_CAPSULE | Freq: Every day | ORAL | Status: DC

## 2013-04-14 NOTE — Progress Notes (Signed)
Sophia Kelley August 14, 1951 MRN [id number]  History of Present Illness:  This is a 62 year old white female with chronic constipation due to slow transit. Her Sitzmarks study in 2008 was negative. She has been on chronic daily laxatives which include MiraLax, mineral oil, milk of magnesia and most recently [employer]. All of them stopped working at some point. Her last colonoscopy in January 2011 showed multiple hyperplastic polyps. She had an adenomatous polyp in 2001 and hyperplastic polyp in 2003. She ran out of [employer] and wants to discuss other options   Past Medical History  Diagnosis Date  . Hyperlipidemia   . Hx of adenomatous colonic polyps   . IBS (irritable bowel syndrome)   . Hemorrhoids   . Colonic inertia   . Active smoker   . OSA (obstructive sleep apnea)     Suspected  . Obesity   . Allergic rhinitis     RAST positive, IgE 1569 from 01/23/2011  . Dyspnea     PFT 02/03/11>>FEV1 2.64(107%, FEV1% 76, TLC 5.44 (103%, DLCO 67%, no BD  . Hypothyroidism   . Vitamin B12 deficiency   . PVD (peripheral vascular disease)   . Hemorrhoids   . Lumbar radiculopathy   . Colonic inertia   . Arthritis   . COPD (chronic obstructive pulmonary disease)   . Osteoporosis   . Nasal congestion   . Trouble swallowing   . Cough   . Rash   . Constipation   . Visual disturbance   . Hernia     lower back  . Vitamin D deficiency    Past Surgical History  Procedure Laterality Date  . Cesarean section      x 2  . Nasal septum surgery    . Abdominal hysterectomy  1990    reports that she has been smoking.  She has never used smokeless tobacco. She reports that she drinks about 1.2 ounces of alcohol per week. She reports that she does not use illicit drugs. family history includes Allergies in her father; Cancer in her brother; Heart attack (age of onset: 29) in her father; and Prostate cancer in her brother. No Known Allergies      Review of Systems:  The remainder of the 10  point ROS is negative except as outlined in H&P   Physical Exam: General appearance  Well developed, in no distress. Or weight Eyes- non icteric. HEENT nontraumatic, normocephalic. Mouth no lesions, tongue papillated, no cheilosis. Neck supple without adenopathy, thyroid not enlarged, no carotid bruits, no JVD. Lungs Clear to auscultation bilaterally. Cor normal S1, normal S2, regular rhythm, no murmur,  quiet precordium. Abdomen: Obese soft nontender. Normoactive bowel sounds large umbilical hernia. Rectal: No stool in the rectum small amount is Hemoccult-negative. Extremities no pedal edema. Skin no lesions. Neurological alert and oriented x 3. Psychological normal mood and affect.  Assessment and Plan:  Problem #41 62 year old white female with colonic inertia and hypomotility. We will start her Linzess 145 mcg daily. She will continue mineral oil. She will also get a prescription for Moviprep to be used for extreme constipation. She will start the first packet and repeat it the next day. She will  be due for a repeat colonoscopy in January 2016.  Obesity- no exercise is contributing to her constipation   04/14/2013 Sophia Kelley

## 2013-04-14 NOTE — Patient Instructions (Addendum)
We have sent the following medications to your pharmacy for you to pick up at your convenience: Linzess 145 mg Moviprep (follow directions on the box)  CC: Dr Varney Baas

## 2013-04-15 ENCOUNTER — Encounter: Payer: Self-pay | Admitting: Internal Medicine

## 2013-05-20 DIAGNOSIS — M810 Age-related osteoporosis without current pathological fracture: Secondary | ICD-10-CM | POA: Insufficient documentation

## 2014-01-26 ENCOUNTER — Other Ambulatory Visit: Payer: Self-pay | Admitting: *Deleted

## 2014-01-26 MED ORDER — LINACLOTIDE 145 MCG PO CAPS: 145.0000 ug | ORAL_CAPSULE | Freq: Every day | ORAL | Status: DC

## 2014-01-30 ENCOUNTER — Encounter (HOSPITAL_BASED_OUTPATIENT_CLINIC_OR_DEPARTMENT_OTHER): Payer: Self-pay | Admitting: Emergency Medicine

## 2014-01-30 ENCOUNTER — Emergency Department (HOSPITAL_BASED_OUTPATIENT_CLINIC_OR_DEPARTMENT_OTHER): Payer: BC Managed Care – PPO

## 2014-01-30 ENCOUNTER — Observation Stay (HOSPITAL_BASED_OUTPATIENT_CLINIC_OR_DEPARTMENT_OTHER)
Admission: AD | Admit: 2014-01-30 | Discharge: 2014-02-01 | Disposition: A | Discharge status: Home / Self Care | Payer: BC Managed Care – PPO | Attending: General Surgery | Admitting: General Surgery

## 2014-01-30 DIAGNOSIS — Z6841 Body Mass Index (BMI) 40.0 and over, adult: Secondary | ICD-10-CM | POA: Insufficient documentation

## 2014-01-30 DIAGNOSIS — K5909 Other constipation: Secondary | ICD-10-CM | POA: Diagnosis present

## 2014-01-30 DIAGNOSIS — K5900 Constipation, unspecified: Secondary | ICD-10-CM | POA: Insufficient documentation

## 2014-01-30 DIAGNOSIS — F17200 Nicotine dependence, unspecified, uncomplicated: Secondary | ICD-10-CM | POA: Insufficient documentation

## 2014-01-30 DIAGNOSIS — Z79899 Other long term (current) drug therapy: Secondary | ICD-10-CM | POA: Insufficient documentation

## 2014-01-30 DIAGNOSIS — G4733 Obstructive sleep apnea (adult) (pediatric): Secondary | ICD-10-CM | POA: Diagnosis present

## 2014-01-30 DIAGNOSIS — Z7982 Long term (current) use of aspirin: Secondary | ICD-10-CM | POA: Insufficient documentation

## 2014-01-30 DIAGNOSIS — K37 Unspecified appendicitis: Secondary | ICD-10-CM

## 2014-01-30 DIAGNOSIS — E785 Hyperlipidemia, unspecified: Secondary | ICD-10-CM | POA: Insufficient documentation

## 2014-01-30 DIAGNOSIS — E039 Hypothyroidism, unspecified: Secondary | ICD-10-CM | POA: Insufficient documentation

## 2014-01-30 DIAGNOSIS — D72829 Elevated white blood cell count, unspecified: Secondary | ICD-10-CM | POA: Insufficient documentation

## 2014-01-30 DIAGNOSIS — J449 Chronic obstructive pulmonary disease, unspecified: Secondary | ICD-10-CM | POA: Insufficient documentation

## 2014-01-30 DIAGNOSIS — M810 Age-related osteoporosis without current pathological fracture: Secondary | ICD-10-CM | POA: Insufficient documentation

## 2014-01-30 DIAGNOSIS — J4489 Other specified chronic obstructive pulmonary disease: Secondary | ICD-10-CM | POA: Insufficient documentation

## 2014-01-30 DIAGNOSIS — K3580 Unspecified acute appendicitis: Principal | ICD-10-CM | POA: Diagnosis present

## 2014-01-30 DIAGNOSIS — I739 Peripheral vascular disease, unspecified: Secondary | ICD-10-CM | POA: Insufficient documentation

## 2014-01-30 LAB — CBC WITH DIFFERENTIAL/PLATELET
Basophils Absolute: 0 10*3/uL (ref 0.0–0.1)
Basophils Relative: 0 % (ref 0–1)
EOS ABS: 0 10*3/uL (ref 0.0–0.7)
Eosinophils Relative: 0 % (ref 0–5)
HCT: 46.5 % — ABNORMAL HIGH (ref 36.0–46.0)
Hemoglobin: 15.2 g/dL — ABNORMAL HIGH (ref 12.0–15.0)
LYMPHS ABS: 0.9 10*3/uL (ref 0.7–4.0)
LYMPHS PCT: 8 % — AB (ref 12–46)
MCH: 30.2 pg (ref 26.0–34.0)
MCHC: 32.7 g/dL (ref 30.0–36.0)
MCV: 92.3 fL (ref 78.0–100.0)
Monocytes Absolute: 0.3 10*3/uL (ref 0.1–1.0)
Monocytes Relative: 3 % (ref 3–12)
NEUTROS PCT: 89 % — AB (ref 43–77)
Neutro Abs: 9.6 10*3/uL — ABNORMAL HIGH (ref 1.7–7.7)
PLATELETS: 165 10*3/uL (ref 150–400)
RBC: 5.04 MIL/uL (ref 3.87–5.11)
RDW: 14.3 % (ref 11.5–15.5)
WBC: 10.9 10*3/uL — AB (ref 4.0–10.5)

## 2014-01-30 LAB — COMPREHENSIVE METABOLIC PANEL
ALBUMIN: 3.8 g/dL (ref 3.5–5.2)
ALT: 16 U/L (ref 0–35)
AST: 16 U/L (ref 0–37)
Alkaline Phosphatase: 71 U/L (ref 39–117)
BUN: 13 mg/dL (ref 6–23)
CHLORIDE: 99 meq/L (ref 96–112)
CO2: 26 meq/L (ref 19–32)
Calcium: 9.4 mg/dL (ref 8.4–10.5)
Creatinine, Ser: 0.8 mg/dL (ref 0.50–1.10)
GFR calc Af Amer: 90 mL/min — ABNORMAL LOW (ref >90)
GFR, EST NON AFRICAN AMERICAN: 77 mL/min — AB (ref >90)
Glucose, Bld: 168 mg/dL — ABNORMAL HIGH (ref 70–99)
Potassium: 4.3 mEq/L (ref 3.7–5.3)
Sodium: 138 mEq/L (ref 137–147)
Total Bilirubin: 0.3 mg/dL (ref 0.3–1.2)
Total Protein: 7.4 g/dL (ref 6.0–8.3)

## 2014-01-30 LAB — LIPASE, BLOOD: LIPASE: 25 U/L (ref 11–59)

## 2014-01-30 MED ORDER — ONDANSETRON HCL 4 MG/2ML IJ SOLN
4.0000 mg | Freq: Once | INTRAMUSCULAR | Status: AC
  Administered 2014-01-30: 4 mg via INTRAVENOUS
  Filled 2014-01-30: qty 2

## 2014-01-30 MED ORDER — IOHEXOL 300 MG/ML  SOLN
50.0000 mL | Freq: Once | INTRAMUSCULAR | Status: AC | PRN
  Administered 2014-01-30: 50 mL via ORAL

## 2014-01-30 MED ORDER — HYDROMORPHONE HCL PF 1 MG/ML IJ SOLN
1.0000 mg | Freq: Once | INTRAMUSCULAR | Status: AC
  Administered 2014-01-30: 1 mg via INTRAVENOUS
  Filled 2014-01-30: qty 1

## 2014-01-30 MED ORDER — SODIUM CHLORIDE 0.9 % IV BOLUS (SEPSIS)
1000.0000 mL | Freq: Once | INTRAVENOUS | Status: AC
  Administered 2014-01-30: 1000 mL via INTRAVENOUS

## 2014-01-30 MED ORDER — IOHEXOL 300 MG/ML  SOLN
100.0000 mL | Freq: Once | INTRAMUSCULAR | Status: AC | PRN
  Administered 2014-01-30: 100 mL via INTRAVENOUS

## 2014-01-30 NOTE — ED Notes (Signed)
Pt states unable to urinate at this time. 

## 2014-01-30 NOTE — ED Provider Notes (Signed)
CSN: [id number]     Arrival date & time 01/30/14  2117 History  This chart was scribed for Shaune Pollack, MD by Donato Schultz, ED Scribe. This patient was seen in room MH08/MH08 and the patient's care was started at 9:53 PM.     Chief Complaint  Patient presents with  . Abdominal Pain      Patient is a 63 y.o. female presenting with abdominal pain. The history is provided by the patient and the spouse. No language interpreter was used.  Abdominal Pain Associated symptoms: nausea and vomiting   Associated symptoms: no diarrhea    HPI Comments: Sophia Kelley is a 63 y.o. female who presents to the Emergency Department complaining of constant epigastric abdominal pain that gradually started this morning.  The patient's husband states that the patient did not eat anything until 3 PM this afternoon when she had a salad.  She states that she has been vomiting as an associated symptom and states that she has vomited liquid every hour since she ate.  The patient's husband states that the patient has not had anything to eat until 3 PM this afternoon.  She states that she has tried to sip ginger ale and water.  She denies diarrhea as an associated symptom.  The patient's husband states that he has given her 2 doses of nausea medication with no relief to her symptoms.   She denies experiencing this pain before.  She denies having a history of abdominal surgery.  She denies experiencing any abdominal injuries recently.  She states that she takes Synthroid everyday.  She denies any alcohol use.  She denies having any chronic medical problems.  She states that her PCP is Dr. Cindy Hazy.   She denies having LNMP.   Past Medical History  Diagnosis Date  . Hyperlipidemia   . Hx of adenomatous colonic polyps   . IBS (irritable bowel syndrome)   . Hemorrhoids   . Colonic inertia   . Active smoker   . OSA (obstructive sleep apnea)     Suspected  . Obesity   . Allergic rhinitis     RAST positive, IgE 1569  from 01/23/2011  . Dyspnea     PFT 02/03/11>>FEV1 2.64(107%, FEV1% 76, TLC 5.44 (103%, DLCO 67%, no BD  . Hypothyroidism   . Vitamin B12 deficiency   . PVD (peripheral vascular disease)   . Hemorrhoids   . Lumbar radiculopathy   . Colonic inertia   . Arthritis   . COPD (chronic obstructive pulmonary disease)   . Osteoporosis   . Nasal congestion   . Trouble swallowing   . Cough   . Rash   . Constipation   . Visual disturbance   . Hernia     lower back  . Vitamin D deficiency    Past Surgical History  Procedure Laterality Date  . Cesarean section      x 2  . Nasal septum surgery    . Abdominal hysterectomy  1990   Family History  Problem Relation Age of Onset  . Heart attack Father 48    x3  . Allergies Father   . Prostate cancer Brother   . Cancer Brother     prostate   History  Substance Use Topics  . Smoking status: Current Every Day Smoker -- 0.30 packs/day for 37 years  . Smokeless tobacco: Never Used  . Alcohol Use: 1.2 oz/week    2 Shots of liquor per week   OB History  Grav Para Term Preterm Abortions TAB SAB Ect Mult Living                 Review of Systems  Constitutional: Positive for appetite change (decreased).  Gastrointestinal: Positive for nausea, vomiting and abdominal pain. Negative for diarrhea.  All other systems reviewed and are negative.      Allergies  Review of patient's allergies indicates no known allergies.  Home Medications   Current Outpatient Rx  Name  Route  Sig  Dispense  Refill  . Levothyroxine Sodium (SYNTHROID PO)   Oral   Take by mouth.         Marland Kitchen aspirin 81 MG EC tablet   Oral   Take 81 mg by mouth daily.           Marland Kitchen lactulose (KRISTALOSE) 20 G packet   Oral   Take 1 packet (20 g total) by mouth every morning.   30 each   5   . Linaclotide (LINZESS) 145 MCG CAPS capsule   Oral   Take 1 capsule (145 mcg total) by mouth daily.   30 capsule   0   . mineral oil liquid   Oral   Take 30 mLs by  mouth daily as needed.           Marland Kitchen MOVIPREP 100 G SOLR   Oral   Take 1 kit (100 g total) by mouth once.   1 kit   1     Dispense as written.    Triage Vitals: BP 152/91  Pulse 87  Temp(Src) 97.6 F (36.4 C) (Oral)  Resp 20  Ht 5' 6" (1.676 m)  Wt 271 lb (122.925 kg)  BMI 43.76 kg/m2  SpO2 94%  Physical Exam  Nursing note and vitals reviewed. Constitutional: She is oriented to person, place, and time. No distress.  Severely obese.  HENT:  Head: Normocephalic and atraumatic.  Eyes: EOM are normal.  Neck: Neck supple. No tracheal deviation present.  Cardiovascular: Normal rate.   Pulmonary/Chest: Effort normal. No respiratory distress.  Abdominal: There is tenderness (diffuse).  Hypoactive bowel sounds.  Musculoskeletal: Normal range of motion.  Neurological: She is alert and oriented to person, place, and time.  Skin: Skin is warm and dry.  Psychiatric: She has a normal mood and affect. Her behavior is normal.   Reexam Abdomen with some right sided ttp with greates ttp at right lower quadrant ED Course  Procedures (including critical care time)  DIAGNOSTIC STUDIES: Oxygen Saturation is 94% on room air, adequate by my interpretation.    COORDINATION OF CARE: 9:58 PM- Discussed administering medication for pain in the ED and obtaining a CT scan of the patient's abdomen.  The patient agreed to the treatment plan.    Labs Review Labs Reviewed  URINALYSIS, ROUTINE W REFLEX MICROSCOPIC   Imaging Review Ct Abdomen Pelvis W Contrast  01/30/2014   CLINICAL DATA:  Mid abdominal pain.  Nausea, vomiting.  EXAM: CT ABDOMEN AND PELVIS WITH CONTRAST  TECHNIQUE: Multidetector CT imaging of the abdomen and pelvis was performed using the standard protocol following bolus administration of intravenous contrast.  CONTRAST:  57m OMNIPAQUE IOHEXOL 300 MG/ML SOLN, 1039mOMNIPAQUE IOHEXOL 300 MG/ML SOLN  COMPARISON:  DG ABD ACUTE W/CHEST dated 07/02/2011  FINDINGS: Minimal linear  subsegmental atelectasis in the lung bases. No effusions. Heart is normal size.  Diffuse low density throughout the liver compatible with fatty infiltration. No focal abnormality. Gallbladder, spleen, pancreas, adrenals and kidneys are unremarkable.  Stomach, large and small bowel grossly unremarkable. No evidence of bowel obstruction.  High-density material noted within the mid portion of the appendix which could represent contrast or appendicolith. The distal appendix is mildly dilated at 12 mm. No surrounding inflammatory changes.  Prior hysterectomy. No adnexal masses. Urinary bladder is unremarkable. Aorta is normal caliber. Incidentally noted is a retro aortic left renal vein.  No acute bony abnormality. Degenerative disc and facet disease in the lower lumbar spine.  IMPRESSION: High-density material within the mid appendix with mild dilatation of the distal appendix. I cannot exclude an appendicolith with early tip appendicitis. No surrounding inflammatory change noted. Recommend clinical correlation for signs and symptoms of appendicitis.  Mild diffuse fatty infiltration of the liver.   Electronically Signed   By: Rolm Baptise M.D.   On: 01/30/2014 23:37    EKG Interpretation   None       MDM   63 y.o. Female with abdominal pain today with anorexia.  Mild leukocytosis with possible early appendicitis on ct.  Discussed with patient and family and plan consultation with general surgery.  Discussed with Dr. Ninfa Linden and patient to be transferred to [facility]  Discussed with Dr.  Tamera Punt.    Shaune Pollack, MD 01/31/14 Dyann Kief

## 2014-01-30 NOTE — ED Notes (Signed)
To ED via EMS c.o abdominal pain and vomiting since this am. Saline lock in her left hand by EMS.

## 2014-01-31 ENCOUNTER — Observation Stay (HOSPITAL_COMMUNITY): Payer: BC Managed Care – PPO | Admitting: Anesthesiology

## 2014-01-31 ENCOUNTER — Encounter (HOSPITAL_COMMUNITY): Payer: Self-pay | Admitting: Anesthesiology

## 2014-01-31 ENCOUNTER — Encounter (HOSPITAL_COMMUNITY): Payer: BC Managed Care – PPO | Admitting: Anesthesiology

## 2014-01-31 ENCOUNTER — Encounter (HOSPITAL_COMMUNITY)
Admission: AD | Disposition: A | Discharge status: Home / Self Care | Payer: Self-pay | Source: Home / Self Care | Attending: Emergency Medicine

## 2014-01-31 DIAGNOSIS — K3580 Unspecified acute appendicitis: Secondary | ICD-10-CM | POA: Diagnosis present

## 2014-01-31 HISTORY — PX: LAPAROSCOPIC APPENDECTOMY: SHX408

## 2014-01-31 LAB — URINALYSIS, ROUTINE W REFLEX MICROSCOPIC
Bilirubin Urine: NEGATIVE
GLUCOSE, UA: NEGATIVE mg/dL
Hgb urine dipstick: NEGATIVE
Ketones, ur: NEGATIVE mg/dL
LEUKOCYTES UA: NEGATIVE
NITRITE: NEGATIVE
PROTEIN: NEGATIVE mg/dL
Specific Gravity, Urine: 1.042 — ABNORMAL HIGH (ref 1.005–1.030)
UROBILINOGEN UA: 0.2 mg/dL (ref 0.0–1.0)
pH: 7 (ref 5.0–8.0)

## 2014-01-31 LAB — SURGICAL PCR SCREEN
MRSA, PCR: NEGATIVE
Staphylococcus aureus: NEGATIVE

## 2014-01-31 SURGERY — APPENDECTOMY, LAPAROSCOPIC
Anesthesia: General

## 2014-01-31 MED ORDER — ONDANSETRON HCL 4 MG/2ML IJ SOLN
4.0000 mg | Freq: Four times a day (QID) | INTRAMUSCULAR | Status: DC | PRN
Start: 2014-01-31 — End: 2014-02-01
  Administered 2014-01-31 (×2): 4 mg via INTRAVENOUS
  Filled 2014-01-31 (×2): qty 2

## 2014-01-31 MED ORDER — HEPARIN SODIUM (PORCINE) 5000 UNIT/ML IJ SOLN
5000.0000 [IU] | Freq: Three times a day (TID) | INTRAMUSCULAR | Status: DC
  Administered 2014-01-31 – 2014-02-01 (×2): 5000 [IU] via SUBCUTANEOUS
  Filled 2014-01-31 (×5): qty 1

## 2014-01-31 MED ORDER — LACTATED RINGERS IV SOLN
INTRAVENOUS | Status: DC | PRN
  Administered 2014-01-31: 1000 mL via INTRAVENOUS

## 2014-01-31 MED ORDER — PROPOFOL 10 MG/ML IV BOLUS
INTRAVENOUS | Status: AC
  Filled 2014-01-31: qty 20

## 2014-01-31 MED ORDER — ONDANSETRON HCL 4 MG/2ML IJ SOLN
INTRAMUSCULAR | Status: AC
  Filled 2014-01-31: qty 2

## 2014-01-31 MED ORDER — EPHEDRINE SULFATE 50 MG/ML IJ SOLN
INTRAMUSCULAR | Status: AC
  Filled 2014-01-31: qty 1

## 2014-01-31 MED ORDER — PNEUMOCOCCAL VAC POLYVALENT 25 MCG/0.5ML IJ INJ
0.5000 mL | INJECTION | Freq: Once | INTRAMUSCULAR | Status: AC
  Administered 2014-01-31: 0.5 mL via INTRAMUSCULAR
  Filled 2014-01-31: qty 0.5

## 2014-01-31 MED ORDER — 0.9 % SODIUM CHLORIDE (POUR BTL) OPTIME
TOPICAL | Status: DC | PRN
  Administered 2014-01-31: 1000 mL

## 2014-01-31 MED ORDER — LIDOCAINE HCL (CARDIAC) 20 MG/ML IV SOLN
INTRAVENOUS | Status: AC
  Filled 2014-01-31: qty 5

## 2014-01-31 MED ORDER — PIPERACILLIN-TAZOBACTAM 3.375 G IVPB
3.3750 g | Freq: Once | INTRAVENOUS | Status: AC
  Administered 2014-01-31: 3.375 g via INTRAVENOUS
  Filled 2014-01-31: qty 50

## 2014-01-31 MED ORDER — DEXAMETHASONE SODIUM PHOSPHATE 4 MG/ML IJ SOLN
INTRAMUSCULAR | Status: DC | PRN
  Administered 2014-01-31: 10 mg via INTRAVENOUS

## 2014-01-31 MED ORDER — ACETAMINOPHEN 10 MG/ML IV SOLN
1000.0000 mg | Freq: Four times a day (QID) | INTRAVENOUS | Status: DC
  Administered 2014-01-31 – 2014-02-01 (×3): 1000 mg via INTRAVENOUS
  Filled 2014-01-31 (×4): qty 100

## 2014-01-31 MED ORDER — PIPERACILLIN-TAZOBACTAM 3.375 G IVPB
3.3750 g | Freq: Three times a day (TID) | INTRAVENOUS | Status: DC
Start: 2014-01-31 — End: 2014-01-31
  Administered 2014-01-31: 3.375 g via INTRAVENOUS
  Filled 2014-01-31 (×3): qty 50

## 2014-01-31 MED ORDER — NEOSTIGMINE METHYLSULFATE 1 MG/ML IJ SOLN
INTRAMUSCULAR | Status: DC | PRN
  Administered 2014-01-31: 5 mg via INTRAVENOUS

## 2014-01-31 MED ORDER — MIDAZOLAM HCL 5 MG/5ML IJ SOLN
INTRAMUSCULAR | Status: DC | PRN
  Administered 2014-01-31: 1 mg via INTRAVENOUS

## 2014-01-31 MED ORDER — POTASSIUM CHLORIDE IN NACL 20-0.9 MEQ/L-% IV SOLN
INTRAVENOUS | Status: DC
  Administered 2014-01-31 – 2014-02-01 (×2): via INTRAVENOUS
  Filled 2014-01-31 (×2): qty 1000

## 2014-01-31 MED ORDER — SUCCINYLCHOLINE CHLORIDE 20 MG/ML IJ SOLN
INTRAMUSCULAR | Status: DC | PRN
  Administered 2014-01-31: 140 mg via INTRAVENOUS

## 2014-01-31 MED ORDER — HYDROMORPHONE HCL PF 1 MG/ML IJ SOLN
0.2500 mg | INTRAMUSCULAR | Status: DC | PRN
  Administered 2014-01-31 (×2): 0.5 mg via INTRAVENOUS

## 2014-01-31 MED ORDER — DEXAMETHASONE SODIUM PHOSPHATE 10 MG/ML IJ SOLN
INTRAMUSCULAR | Status: AC
  Filled 2014-01-31: qty 1

## 2014-01-31 MED ORDER — LACTATED RINGERS IV SOLN
INTRAVENOUS | Status: DC | PRN
  Administered 2014-01-31: 07:00:00 via INTRAVENOUS

## 2014-01-31 MED ORDER — ACETAMINOPHEN 10 MG/ML IV SOLN
1000.0000 mg | Freq: Once | INTRAVENOUS | Status: DC
Start: 2014-01-31 — End: 2014-01-31
  Filled 2014-01-31: qty 100

## 2014-01-31 MED ORDER — BUPIVACAINE-EPINEPHRINE PF 0.25-1:200000 % IJ SOLN
INTRAMUSCULAR | Status: AC
  Filled 2014-01-31: qty 30

## 2014-01-31 MED ORDER — ACETAMINOPHEN 10 MG/ML IV SOLN
INTRAVENOUS | Status: DC | PRN
  Administered 2014-01-31: 1000 mg via INTRAVENOUS

## 2014-01-31 MED ORDER — FENTANYL CITRATE 0.05 MG/ML IJ SOLN
INTRAMUSCULAR | Status: AC
  Filled 2014-01-31: qty 5

## 2014-01-31 MED ORDER — ACETAMINOPHEN 10 MG/ML IV SOLN
1000.0000 mg | Freq: Four times a day (QID) | INTRAVENOUS | Status: DC
  Filled 2014-01-31 (×2): qty 100

## 2014-01-31 MED ORDER — KETAMINE HCL 10 MG/ML IJ SOLN
INTRAMUSCULAR | Status: AC
  Filled 2014-01-31: qty 1

## 2014-01-31 MED ORDER — KETAMINE HCL 10 MG/ML IJ SOLN
INTRAMUSCULAR | Status: DC | PRN
  Administered 2014-01-31: 25 mg via INTRAVENOUS

## 2014-01-31 MED ORDER — BUPIVACAINE-EPINEPHRINE PF 0.25-1:200000 % IJ SOLN
INTRAMUSCULAR | Status: DC | PRN
  Administered 2014-01-31: 20 mL

## 2014-01-31 MED ORDER — LINACLOTIDE 145 MCG PO CAPS
145.0000 ug | ORAL_CAPSULE | ORAL | Status: DC
  Administered 2014-01-31: 145 ug via ORAL
  Filled 2014-01-31: qty 1

## 2014-01-31 MED ORDER — NEOSTIGMINE METHYLSULFATE 1 MG/ML IJ SOLN
INTRAMUSCULAR | Status: AC
  Filled 2014-01-31: qty 10

## 2014-01-31 MED ORDER — POTASSIUM CHLORIDE IN NACL 20-0.9 MEQ/L-% IV SOLN
INTRAVENOUS | Status: DC
  Administered 2014-01-31: 05:00:00 via INTRAVENOUS
  Filled 2014-01-31 (×3): qty 1000

## 2014-01-31 MED ORDER — CISATRACURIUM BESYLATE (PF) 10 MG/5ML IV SOLN
INTRAVENOUS | Status: DC | PRN
  Administered 2014-01-31: 10 mg via INTRAVENOUS

## 2014-01-31 MED ORDER — LIDOCAINE HCL (CARDIAC) 20 MG/ML IV SOLN
INTRAVENOUS | Status: DC | PRN
  Administered 2014-01-31: 30 mg via INTRAVENOUS

## 2014-01-31 MED ORDER — GLYCOPYRROLATE 0.2 MG/ML IJ SOLN
INTRAMUSCULAR | Status: AC
  Filled 2014-01-31: qty 2

## 2014-01-31 MED ORDER — PIPERACILLIN-TAZOBACTAM 3.375 G IVPB
INTRAVENOUS | Status: AC
  Filled 2014-01-31: qty 50

## 2014-01-31 MED ORDER — SODIUM CHLORIDE 0.9 % IJ SOLN
INTRAMUSCULAR | Status: AC
  Filled 2014-01-31: qty 10

## 2014-01-31 MED ORDER — MORPHINE SULFATE 4 MG/ML IJ SOLN
4.0000 mg | INTRAMUSCULAR | Status: DC | PRN
  Administered 2014-01-31 (×2): 4 mg via INTRAVENOUS
  Filled 2014-01-31 (×3): qty 1

## 2014-01-31 MED ORDER — HYDROMORPHONE HCL PF 1 MG/ML IJ SOLN
INTRAMUSCULAR | Status: AC
  Filled 2014-01-31: qty 1

## 2014-01-31 MED ORDER — MIDAZOLAM HCL 2 MG/2ML IJ SOLN
INTRAMUSCULAR | Status: AC
  Filled 2014-01-31: qty 2

## 2014-01-31 MED ORDER — PROMETHAZINE HCL 25 MG/ML IJ SOLN: 6.2500 mg | INTRAMUSCULAR | Status: DC | PRN

## 2014-01-31 MED ORDER — ONDANSETRON HCL 4 MG/2ML IJ SOLN
INTRAMUSCULAR | Status: DC | PRN
  Administered 2014-01-31: 4 mg via INTRAVENOUS

## 2014-01-31 MED ORDER — GLYCOPYRROLATE 0.2 MG/ML IJ SOLN
INTRAMUSCULAR | Status: DC | PRN
  Administered 2014-01-31: .6 mg via INTRAVENOUS

## 2014-01-31 MED ORDER — LEVOTHYROXINE SODIUM 137 MCG PO TABS
137.0000 ug | ORAL_TABLET | Freq: Every day | ORAL | Status: DC
  Administered 2014-02-01: 137 ug via ORAL
  Filled 2014-01-31 (×2): qty 1

## 2014-01-31 MED ORDER — CISATRACURIUM BESYLATE 20 MG/10ML IV SOLN
INTRAVENOUS | Status: AC
  Filled 2014-01-31: qty 10

## 2014-01-31 MED ORDER — HYDROCODONE-ACETAMINOPHEN 5-325 MG PO TABS: 1.0000 | ORAL_TABLET | ORAL | Status: DC | PRN

## 2014-01-31 MED ORDER — CISATRACURIUM BESYLATE (PF) 10 MG/5ML IV SOLN: INTRAVENOUS | Status: DC | PRN

## 2014-01-31 MED ORDER — ZOLPIDEM TARTRATE 5 MG PO TABS
5.0000 mg | ORAL_TABLET | Freq: Once | ORAL | Status: AC
  Administered 2014-01-31: 5 mg via ORAL
  Filled 2014-01-31: qty 1

## 2014-01-31 MED ORDER — PROPOFOL 10 MG/ML IV BOLUS
INTRAVENOUS | Status: DC | PRN
  Administered 2014-01-31: 200 mg via INTRAVENOUS

## 2014-01-31 MED ORDER — GLYCOPYRROLATE 0.2 MG/ML IJ SOLN
INTRAMUSCULAR | Status: AC
  Filled 2014-01-31: qty 3

## 2014-01-31 MED ORDER — FENTANYL CITRATE 0.05 MG/ML IJ SOLN
INTRAMUSCULAR | Status: DC | PRN
Start: 2014-01-31 — End: 2014-01-31
  Administered 2014-01-31: 100 ug via INTRAVENOUS
  Administered 2014-01-31: 50 ug via INTRAVENOUS

## 2014-01-31 SURGICAL SUPPLY — 35 items
APL SKNCLS STERI-STRIP NONHPOA (GAUZE/BANDAGES/DRESSINGS) ×1
APPLIER CLIP 5 13 M/L LIGAMAX5 (MISCELLANEOUS) ×3
APR CLP MED LRG 5 ANG JAW (MISCELLANEOUS) ×1
BAG SPEC RTRVL LRG 6X4 10 (ENDOMECHANICALS) ×1
BANDAGE ADH SHEER 1  50/CT (GAUZE/BANDAGES/DRESSINGS) ×9 IMPLANT
BENZOIN TINCTURE PRP APPL 2/3 (GAUZE/BANDAGES/DRESSINGS) ×3 IMPLANT
CANISTER SUCTION 2500CC (MISCELLANEOUS) ×3 IMPLANT
CHLORAPREP W/TINT 26ML (MISCELLANEOUS) ×5 IMPLANT
CLIP APPLIE 5 13 M/L LIGAMAX5 (MISCELLANEOUS) IMPLANT
CLOSURE WOUND 1/2 X4 (GAUZE/BANDAGES/DRESSINGS) ×1
CUTTER FLEX LINEAR 45M (STAPLE) ×2 IMPLANT
DECANTER SPIKE VIAL GLASS SM (MISCELLANEOUS) ×1 IMPLANT
DRAPE LAPAROSCOPIC ABDOMINAL (DRAPES) ×3 IMPLANT
ELECT REM PT RETURN 9FT ADLT (ELECTROSURGICAL) ×3
ELECTRODE REM PT RTRN 9FT ADLT (ELECTROSURGICAL) ×1 IMPLANT
GLOVE SURG SIGNA 7.5 PF LTX (GLOVE) ×6 IMPLANT
GOWN STRL REUS W/TWL LRG LVL3 (GOWN DISPOSABLE) ×3 IMPLANT
GOWN STRL REUS W/TWL XL LVL3 (GOWN DISPOSABLE) ×6 IMPLANT
KIT BASIN OR (CUSTOM PROCEDURE TRAY) ×3 IMPLANT
POUCH SPECIMEN RETRIEVAL 10MM (ENDOMECHANICALS) ×3 IMPLANT
RELOAD 45 VASCULAR/THIN (ENDOMECHANICALS) IMPLANT
RELOAD STAPLE 45 2.5 WHT GRN (ENDOMECHANICALS) IMPLANT
RELOAD STAPLE 45 3.5 BLU ETS (ENDOMECHANICALS) IMPLANT
RELOAD STAPLE TA45 3.5 REG BLU (ENDOMECHANICALS) ×3 IMPLANT
SCALPEL HARMONIC ACE (MISCELLANEOUS) ×2 IMPLANT
SET IRRIG TUBING LAPAROSCOPIC (IRRIGATION / IRRIGATOR) ×3 IMPLANT
SOLUTION ANTI FOG 6CC (MISCELLANEOUS) ×3 IMPLANT
STRIP CLOSURE SKIN 1/2X4 (GAUZE/BANDAGES/DRESSINGS) ×2 IMPLANT
SUT MNCRL AB 4-0 PS2 18 (SUTURE) ×3 IMPLANT
TOWEL OR 17X26 10 PK STRL BLUE (TOWEL DISPOSABLE) ×3 IMPLANT
TRAY FOLEY CATH 14FRSI W/METER (CATHETERS) ×1 IMPLANT
TRAY LAP CHOLE (CUSTOM PROCEDURE TRAY) ×3 IMPLANT
TROCAR BLADELESS OPT 5 75 (ENDOMECHANICALS) ×6 IMPLANT
TROCAR XCEL BLUNT TIP 100MML (ENDOMECHANICALS) ×3 IMPLANT
TUBING INSUFFLATION 10FT LAP (TUBING) ×3 IMPLANT

## 2014-01-31 NOTE — H&P (Signed)
Sophia Kelley is an 63 y.o. female.   Chief Complaint: Right lower quadrant abdominal pain HPI: This is a pleasant female who reports waking up yesterday not feeling well. She then started developing nausea and vomiting. She initially thought she had a gastrointestinal virus and then started having pain in the right lower quadrant. She presented to med center [facility] and was found on CAT scan to have bodies consistent with appendicitis. She was transferred here for further care. She denies shortness of breath or chest pain. Bowel movements have been normal. She denies fevers. Her pain in the right lower quadrant is moderate in intensity and sharp. It does not refer anywhere else  Past Medical History  Diagnosis Date  . Hyperlipidemia   . Hx of adenomatous colonic polyps   . IBS (irritable bowel syndrome)   . Hemorrhoids   . Colonic inertia   . Active smoker   . OSA (obstructive sleep apnea)     Suspected  . Obesity   . Allergic rhinitis     RAST positive, IgE 1569 from 01/23/2011  . Dyspnea     PFT 02/03/11>>FEV1 2.64(107%, FEV1% 76, TLC 5.44 (103%, DLCO 67%, no BD  . Hypothyroidism   . Vitamin B12 deficiency   . PVD (peripheral vascular disease)   . Hemorrhoids   . Lumbar radiculopathy   . Colonic inertia   . Arthritis   . COPD (chronic obstructive pulmonary disease)   . Osteoporosis   . Nasal congestion   . Trouble swallowing   . Cough   . Rash   . Constipation   . Visual disturbance   . Hernia     lower back  . Vitamin D deficiency     Past Surgical History  Procedure Laterality Date  . Cesarean section      x 2  . Nasal septum surgery    . Abdominal hysterectomy  1990    Family History  Problem Relation Age of Onset  . Heart attack Father 48    x3  . Allergies Father   . Prostate cancer Brother   . Cancer Brother     prostate   Social History:  reports that she has been smoking.  She has never used smokeless tobacco. She reports that she drinks about 1.2  ounces of alcohol per week. She reports that she does not use illicit drugs.  Allergies: No Known Allergies  Medications Prior to Admission  Medication Dose Route Frequency Provider Last Rate Last Dose  . cyanocobalamin ((VITAMIN B-12)) injection 1,000 mcg  1,000 mcg Intramuscular Once Janith Lima, MD      . pneumococcal 23 valent vaccine (PNU-IMMUNE) injection 0.5 mL  0.5 mL Intramuscular Once Janith Lima, MD       Medications Prior to Admission  Medication Sig Dispense Refill  . aspirin 81 MG EC tablet Take 81 mg by mouth every Monday, Wednesday, and Friday.       Marland Kitchen KRILL OIL PO Take 1 capsule by mouth daily.      Marland Kitchen levothyroxine (SYNTHROID, LEVOTHROID) 137 MCG tablet Take 137 mcg by mouth daily before breakfast.      . Linaclotide (LINZESS) 145 MCG CAPS capsule Take 145 mcg by mouth every other day.      Marland Kitchen VITAMIN D, CHOLECALCIFEROL, PO Take 1 capsule by mouth every Monday, Wednesday, and Friday.        Results for orders placed during the hospital encounter of 01/30/14 (from the past 48 hour(s))  CBC WITH DIFFERENTIAL     Status: Abnormal   Collection Time    01/30/14 10:06 PM      Result Value Ref Range   WBC 10.9 (*) 4.0 - 10.5 K/uL   RBC 5.04  3.87 - 5.11 MIL/uL   Hemoglobin 15.2 (*) 12.0 - 15.0 g/dL   HCT 46.5 (*) 36.0 - 46.0 %   MCV 92.3  78.0 - 100.0 fL   MCH 30.2  26.0 - 34.0 pg   MCHC 32.7  30.0 - 36.0 g/dL   RDW 14.3  11.5 - 15.5 %   Platelets 165  150 - 400 K/uL   Neutrophils Relative % 89 (*) 43 - 77 %   Neutro Abs 9.6 (*) 1.7 - 7.7 K/uL   Lymphocytes Relative 8 (*) 12 - 46 %   Lymphs Abs 0.9  0.7 - 4.0 K/uL   Monocytes Relative 3  3 - 12 %   Monocytes Absolute 0.3  0.1 - 1.0 K/uL   Eosinophils Relative 0  0 - 5 %   Eosinophils Absolute 0.0  0.0 - 0.7 K/uL   Basophils Relative 0  0 - 1 %   Basophils Absolute 0.0  0.0 - 0.1 K/uL  LIPASE, BLOOD     Status: None   Collection Time    01/30/14 10:06 PM      Result Value Ref Range   Lipase 25  11 - 59 U/L   COMPREHENSIVE METABOLIC PANEL     Status: Abnormal   Collection Time    01/30/14 10:06 PM      Result Value Ref Range   Sodium 138  137 - 147 mEq/L   Potassium 4.3  3.7 - 5.3 mEq/L   Chloride 99  96 - 112 mEq/L   CO2 26  19 - 32 mEq/L   Glucose, Bld 168 (*) 70 - 99 mg/dL   BUN 13  6 - 23 mg/dL   Creatinine, Ser 0.80  0.50 - 1.10 mg/dL   Calcium 9.4  8.4 - 10.5 mg/dL   Total Protein 7.4  6.0 - 8.3 g/dL   Albumin 3.8  3.5 - 5.2 g/dL   AST 16  0 - 37 U/L   ALT 16  0 - 35 U/L   Alkaline Phosphatase 71  39 - 117 U/L   Total Bilirubin 0.3  0.3 - 1.2 mg/dL   GFR calc non Af Amer 77 (*) >90 mL/min   GFR calc Af Amer 90 (*) >90 mL/min   Comment: (NOTE)     The eGFR has been calculated using the CKD EPI equation.     This calculation has not been validated in all clinical situations.     eGFR's persistently <90 mL/min signify possible Chronic Kidney     Disease.   Ct Abdomen Pelvis W Contrast  01/30/2014   CLINICAL DATA:  Mid abdominal pain.  Nausea, vomiting.  EXAM: CT ABDOMEN AND PELVIS WITH CONTRAST  TECHNIQUE: Multidetector CT imaging of the abdomen and pelvis was performed using the standard protocol following bolus administration of intravenous contrast.  CONTRAST:  63mL OMNIPAQUE IOHEXOL 300 MG/ML SOLN, 14mL OMNIPAQUE IOHEXOL 300 MG/ML SOLN  COMPARISON:  DG ABD ACUTE W/CHEST dated 07/02/2011  FINDINGS: Minimal linear subsegmental atelectasis in the lung bases. No effusions. Heart is normal size.  Diffuse low density throughout the liver compatible with fatty infiltration. No focal abnormality. Gallbladder, spleen, pancreas, adrenals and kidneys are unremarkable. Stomach, large and small bowel grossly unremarkable. No evidence of  bowel obstruction.  High-density material noted within the mid portion of the appendix which could represent contrast or appendicolith. The distal appendix is mildly dilated at 12 mm. No surrounding inflammatory changes.  Prior hysterectomy. No adnexal masses.  Urinary bladder is unremarkable. Aorta is normal caliber. Incidentally noted is a retro aortic left renal vein.  No acute bony abnormality. Degenerative disc and facet disease in the lower lumbar spine.  IMPRESSION: High-density material within the mid appendix with mild dilatation of the distal appendix. I cannot exclude an appendicolith with early tip appendicitis. No surrounding inflammatory change noted. Recommend clinical correlation for signs and symptoms of appendicitis.  Mild diffuse fatty infiltration of the liver.   Electronically Signed   By: Rolm Baptise M.D.   On: 01/30/2014 23:37    Review of Systems  All other systems reviewed and are negative.    Blood pressure 132/41, pulse 77, temperature 97.7 F (36.5 C), temperature source Oral, resp. rate 20, height $RemoveBe'5\' 6"'ZlbOAoBqS$  (1.676 m), weight 277 lb 1.6 oz (125.692 kg), SpO2 98.00%. Physical Exam  Constitutional: She is oriented to person, place, and time. No distress.  Morbidly obese female who appears fairly comfortable  HENT:  Head: Normocephalic and atraumatic.  Right Ear: External ear normal.  Left Ear: External ear normal.  Nose: Nose normal.  Mouth/Throat: Oropharynx is clear and moist. No oropharyngeal exudate.  Eyes: Conjunctivae are normal. Pupils are equal, round, and reactive to light. Right eye exhibits no discharge. Left eye exhibits no discharge. No scleral icterus.  Neck: Normal range of motion. Neck supple. No tracheal deviation present.  Cardiovascular: Normal rate, regular rhythm, normal heart sounds and intact distal pulses.   No murmur heard. Respiratory: Effort normal and breath sounds normal. No respiratory distress. She has no wheezes.  GI: Soft. Bowel sounds are normal. There is tenderness. There is guarding.  There is moderate to severe tenderness with guarding in the right lower quadrant. Her abdomen is otherwise soft and obese  Musculoskeletal: Normal range of motion. She exhibits no edema and no tenderness.   Lymphadenopathy:    She has no cervical adenopathy.  Neurological: She is alert and oriented to person, place, and time.  Skin: Skin is warm and dry. No rash noted. She is not diaphoretic. No erythema.  Psychiatric: Her behavior is normal. Judgment normal.     Assessment/Plan Acute appendicitis  Laparoscopic appendectomy is recommended. I discussed this with her in detail. I discussed the risks of surgery which includes is not limited to bleeding, infection, injury to surrounding structures, appendiceal stump leak, need for conversion to open procedure, the chance of finding a normal appendix which was still be removed, Cardiopulmonary issues, DVT.  She is currently on IV antibiotics. The surgery will be performed by either myself or Dr. Lucia Gaskins depending on the OR Availability  Machi Whittaker A 01/31/2014, 5:44 AM

## 2014-01-31 NOTE — Progress Notes (Signed)
UR completed 

## 2014-01-31 NOTE — ED Provider Notes (Signed)
Patient transferred from [place] to [place] ED for admission by General Surgery for early appendicitis, diagnosed via CT abd/pelvis.  Had presented to ED w/ one day of severe abdominal pain w/ associated anorexia and nausea.  Pain improved w/ IV dilaudid prior to transfer but it has returned.  On brief exam, she is afebrile, hemodynamically stable, uncomfortable appearing, abd soft/non-distended, diffusely ttp w/ guarding.  Dr. Ninfa Linden is aware that she has arrived and has already written admission orders. 2:22 AM   Remer Macho, PA-C 01/31/14 0222

## 2014-01-31 NOTE — Op Note (Signed)
Appendectomy, Lap, Procedure Note  Indications: The patient presented with a history of right-sided abdominal pain. A CT revealed findings consistent with acute appendicitis.  Pre-operative Diagnosis: Acute appendicitis without mention of peritonitis  Post-operative Diagnosis: Same  Surgeon: Coralie Keens A   Assistants: 0  Anesthesia: General endotracheal anesthesia  ASA Class: 3  Procedure Details  The patient was seen again in the Holding Room. The risks, benefits, complications, treatment options, and expected outcomes were discussed with the patient and/or family. The possibilities of reaction to medication, perforation of viscus, bleeding, recurrent infection, finding a normal appendix, the need for additional procedures, failure to diagnose a condition, and creating a complication requiring transfusion or operation were discussed. There was concurrence with the proposed plan and informed consent was obtained. The site of surgery was properly noted. The patient was taken to Operating Room, identified as Sophia Kelley and the procedure verified as Appendectomy. A Time Out was held and the above information confirmed.  The patient was placed in the supine position and general anesthesia was induced, along with placement of orogastric tube, Venodyne boots, and a Foley catheter. The abdomen was prepped and draped in a sterile fashion. A one centimeter infraumbilical incision was made.  The umbilical stalk was elevated, and the midline fascia was incised with a #11 blade.  A Kelly clamp was used to confirm entrance into the peritoneal cavity.  A pursestring suture was passed around the incision with a 0 Vicryl.  The Hasson was introduced into the abdomen and the tails of the suture were used to hold the Hasson in place.   The pneumoperitoneum was then established to steady pressure of 15 mmHg.  Additional 5 mm cannulas then placed in the left lower quadrant of the abdomen and the  suprapubic region under direct visualization. A careful evaluation of the entire abdomen was carried out. The patient was placed in Trendelenburg and left lateral decubitus position. The small intestines were retracted in the cephalad and left lateral direction away from the pelvis and right lower quadrant. The patient was found to have an enlarged and inflamed appendix that was extending into the pelvis. There was no evidence of perforation.  The appendix was carefully dissected. The appendix was was skeletonized with the harmonic scalpel.   The appendix was divided at its base using an endo-GIA stapler. Minimal appendiceal stump was left in place. There was no evidence of bleeding, leakage, or complication after division of the appendix. Irrigation was also performed and irrigate suctioned from the abdomen as well.  The umbilical port site was closed with the purse string suture. There was no residual palpable fascial defect.  The trocar site skin wounds were closed with 4-0 Monocryl.  Instrument, sponge, and needle counts were correct at the conclusion of the case.   Findings: The appendix was found to be inflamed. There were not signs of necrosis.  There was not perforation. There was not abscess formation.  Estimated Blood Loss:  Minimal         Drains:none         Complications:  None; patient tolerated the procedure well.         Disposition: PACU - hemodynamically stable.         Condition: stable

## 2014-01-31 NOTE — ED Provider Notes (Signed)
Medical screening examination/treatment/procedure(s) were performed by non-physician practitioner and as supervising physician I was immediately available for consultation/collaboration.  EKG Interpretation   None       Rolland Porter, MD, Abram Sander   Janice Norrie, MD 01/31/14 [phone]

## 2014-01-31 NOTE — Anesthesia Postprocedure Evaluation (Signed)
  Anesthesia Post-op Note  Patient: Sophia Kelley  Procedure(s) Performed: Procedure(s) (LRB): APPENDECTOMY LAPAROSCOPIC (N/A)  Patient Location: PACU  Anesthesia Type: General  Level of Consciousness: awake and alert   Airway and Oxygen Therapy: Patient Spontanous Breathing  Post-op Pain: mild  Post-op Assessment: Post-op Vital signs reviewed, Patient's Cardiovascular Status Stable, Respiratory Function Stable, Patent Airway and No signs of Nausea or vomiting  Last Vitals:  Filed Vitals:   01/31/14 0900  BP: 136/67  Pulse: 65  Temp:   Resp: 22    Post-op Vital Signs: stable   Complications: No apparent anesthesia complications

## 2014-01-31 NOTE — Progress Notes (Addendum)
Day of Surgery  Subjective: She has only been back from the ER for an hour or so, awake and sore.  Objective: Vital signs in last 24 hours: Temp:  [97.5 F (36.4 C)-97.9 F (36.6 C)] 97.5 F (36.4 C) (02/18 0943) Pulse Rate:  [63-88] 72 (02/18 0943) Resp:  [15-22] 16 (02/18 0943) BP: (110-156)/(41-91) 110/73 mmHg (02/18 0943) SpO2:  [93 %-100 %] 94 % (02/18 0943) Weight:  [122.925 kg (271 lb)-125.692 kg (277 lb 1.6 oz)] 125.692 kg (277 lb 1.6 oz) (02/18 0339) Last BM Date: 01/30/14  Intake/Output from previous day: 02/17 0701 - 02/18 0700 In: -  Out: 450 [Urine:450] Intake/Output this shift: Total I/O In: 300 [I.V.:300] Out: -   General appearance: alert, cooperative and no distress GI: tender post op.  Lab Results:   Recent Labs  01/30/14 2206  WBC 10.9*  HGB 15.2*  HCT 46.5*  PLT 165    BMET  Recent Labs  01/30/14 2206  NA 138  K 4.3  CL 99  CO2 26  GLUCOSE 168*  BUN 13  CREATININE 0.80  CALCIUM 9.4   PT/INR No results found for this basename: LABPROT, INR,  in the last 72 hours   Recent Labs Lab 01/30/14 2206  AST 16  ALT 16  ALKPHOS 71  BILITOT 0.3  PROT 7.4  ALBUMIN 3.8     Lipase     Component Value Date/Time   LIPASE 25 01/30/2014 2206     Studies/Results: Ct Abdomen Pelvis W Contrast  01/30/2014   CLINICAL DATA:  Mid abdominal pain.  Nausea, vomiting.  EXAM: CT ABDOMEN AND PELVIS WITH CONTRAST  TECHNIQUE: Multidetector CT imaging of the abdomen and pelvis was performed using the standard protocol following bolus administration of intravenous contrast.  CONTRAST:  3mL OMNIPAQUE IOHEXOL 300 MG/ML SOLN, 150mL OMNIPAQUE IOHEXOL 300 MG/ML SOLN  COMPARISON:  DG ABD ACUTE W/CHEST dated 07/02/2011  FINDINGS: Minimal linear subsegmental atelectasis in the lung bases. No effusions. Heart is normal size.  Diffuse low density throughout the liver compatible with fatty infiltration. No focal abnormality. Gallbladder, spleen, pancreas, adrenals  and kidneys are unremarkable. Stomach, large and small bowel grossly unremarkable. No evidence of bowel obstruction.  High-density material noted within the mid portion of the appendix which could represent contrast or appendicolith. The distal appendix is mildly dilated at 12 mm. No surrounding inflammatory changes.  Prior hysterectomy. No adnexal masses. Urinary bladder is unremarkable. Aorta is normal caliber. Incidentally noted is a retro aortic left renal vein.  No acute bony abnormality. Degenerative disc and facet disease in the lower lumbar spine.  IMPRESSION: High-density material within the mid appendix with mild dilatation of the distal appendix. I cannot exclude an appendicolith with early tip appendicitis. No surrounding inflammatory change noted. Recommend clinical correlation for signs and symptoms of appendicitis.  Mild diffuse fatty infiltration of the liver.   Electronically Signed   By: Rolm Baptise M.D.   On: 01/30/2014 23:37    Medications: . acetaminophen  1,000 mg Intravenous Q6H  . HYDROmorphone      . [START ON 02/01/2014] levothyroxine  137 mcg Oral QAC breakfast  . Linaclotide  145 mcg Oral QODAY  . pneumococcal 23 valent vaccine  0.5 mL Intramuscular Once    Assessment/Plan Acute appendicitis s/p Appendectomy LAPAROSCOPIC, this AM 01/31/14 Dr. Ninfa Linden Chronic constipation Hypothyroid on supplement. Body mass index is 44.75   Plan:  I will restart her home meds tomorrow after we get her up and mobilized.  If she does well full liquids at supper.  Add heparin tonight for DVT, scd's today.  LOS: 1 day    JENNINGS,WILLARD 01/31/2014  Agree with above. Doing better tonight.  Pain doing better. She is a large woman.  Alphonsa Overall, MD, [facility] Surgery Pager: [phone] Office phone:  [phone]

## 2014-01-31 NOTE — Preoperative (Signed)
Beta Blockers   Reason not to administer Beta Blockers:Not Applicable 

## 2014-01-31 NOTE — Transfer of Care (Signed)
Immediate Anesthesia Transfer of Care Note  Patient: Sophia Kelley  Procedure(s) Performed: Procedure(s) (LRB): APPENDECTOMY LAPAROSCOPIC (N/A)  Patient Location: PACU  Anesthesia Type: General  Level of Consciousness: sedated, patient cooperative and responds to stimulation  Airway & Oxygen Therapy: Patient Spontanous Breathing and Patient connected to face mask oxgen  Post-op Assessment: Report given to PACU RN and Post -op Vital signs reviewed and stable  Post vital signs: Reviewed and stable  Complications: No apparent anesthesia complications

## 2014-01-31 NOTE — Anesthesia Preprocedure Evaluation (Signed)
Anesthesia Evaluation  Patient identified by MRN, date of birth, ID band Patient awake    Reviewed: Allergy & Precautions, H&P , NPO status , Patient's Chart, lab work & pertinent test results  Airway Mallampati: II TM Distance: >3 FB Neck ROM: Full    Dental no notable dental hx.    Pulmonary shortness of breath, sleep apnea , COPDCurrent Smoker,  breath sounds clear to auscultation  Pulmonary exam normal       Cardiovascular + Peripheral Vascular Disease Rhythm:Regular Rate:Normal     Neuro/Psych  Neuromuscular disease negative psych ROS   GI/Hepatic negative GI ROS, Neg liver ROS,   Endo/Other  Hypothyroidism Morbid obesity  Renal/GU negative Renal ROS  negative genitourinary   Musculoskeletal negative musculoskeletal ROS (+)   Abdominal   Peds negative pediatric ROS (+)  Hematology negative hematology ROS (+)   Anesthesia Other Findings   Reproductive/Obstetrics negative OB ROS                           Anesthesia Physical Anesthesia Plan  ASA: III  Anesthesia Plan: General   Post-op Pain Management:    Induction: Intravenous  Airway Management Planned: Oral ETT  Additional Equipment:   Intra-op Plan:   Post-operative Plan: Extubation in OR  Informed Consent: I have reviewed the patients History and Physical, chart, labs and discussed the procedure including the risks, benefits and alternatives for the proposed anesthesia with the patient or authorized representative who has indicated his/her understanding and acceptance.   Dental advisory given  Plan Discussed with: CRNA  Anesthesia Plan Comments:         Anesthesia Quick Evaluation

## 2014-02-01 ENCOUNTER — Encounter (HOSPITAL_COMMUNITY): Payer: Self-pay | Admitting: Surgery

## 2014-02-01 ENCOUNTER — Telehealth (INDEPENDENT_AMBULATORY_CARE_PROVIDER_SITE_OTHER): Payer: Self-pay | Admitting: General Surgery

## 2014-02-01 DIAGNOSIS — K5909 Other constipation: Secondary | ICD-10-CM | POA: Diagnosis present

## 2014-02-01 MED ORDER — ACETAMINOPHEN 325 MG PO TABS
650.0000 mg | ORAL_TABLET | Freq: Four times a day (QID) | ORAL | Status: DC | PRN
  Administered 2014-02-01: 650 mg via ORAL
  Filled 2014-02-01: qty 2

## 2014-02-01 MED ORDER — IBUPROFEN 600 MG PO TABS
600.0000 mg | ORAL_TABLET | Freq: Four times a day (QID) | ORAL | Status: DC | PRN
  Filled 2014-02-01: qty 1

## 2014-02-01 MED ORDER — ACETAMINOPHEN 325 MG PO TABS: 650.0000 mg | ORAL_TABLET | Freq: Four times a day (QID) | ORAL | Status: DC | PRN

## 2014-02-01 MED ORDER — HYDROCODONE-ACETAMINOPHEN 5-325 MG PO TABS: 1.0000 | ORAL_TABLET | ORAL | Status: DC | PRN

## 2014-02-01 MED ORDER — IBUPROFEN 200 MG PO TABS: ORAL_TABLET | ORAL | Status: DC

## 2014-02-01 NOTE — Telephone Encounter (Signed)
Pt called to report she has on-going nausea and requesting medication for it.  She is still weak, but trying to walk some in the house.  She is beginning to pass some gas.  Recommended she begin eating some warm mashed potatoes or oatmeal (for fiber and simple to digest) and drink gingerale by sips.  Will get message to Dr. Ninfa Linden about the nausea.

## 2014-02-01 NOTE — Discharge Instructions (Signed)
Laparoscopic Appendectomy °Appendectomy is surgery to remove the appendix. Laparoscopic surgery uses several small cuts (incisions) instead of one large incision. Laparoscopic surgery offers a shorter recovery time and less discomfort. °LET YOUR CAREGIVER KNOW ABOUT: °· Allergies to food or medicine. °· Medicines taken, including vitamins, dietary supplements, herbs, eyedrops, over-the-counter medicines, and creams. °· Use of steroids (by mouth or creams). °· Previous problems with anesthetics or numbing medicines. °· History of bleeding problems or blood clots. °· Previous surgery. °· Other health problems, including diabetes, heart problems, lung problems, and kidney problems. °· Possibility of pregnancy, if this applies. °RISKS AND COMPLICATIONS °· Infection. A germ starts growing in the wound. This can usually be treated with antibiotics. In some cases, the wound will need to be opened and cleaned. °· Bleeding. °· Damage to other organs. °· Sores (abscesses). °· Chronic pain at the incision sites. This is defined as pain that lasts for more than 3 months. °· Blood clots in the legs that may rarely travel to the lungs. °· Infection in the lungs (pneumonia). °BEFORE THE PROCEDURE °Appendectomy is usually performed immediately after an inflamed appendix (appendicitis) is diagnosed. No preparation is necessary ahead of this procedure. °PROCEDURE  °You will be given medicine that makes you sleep (general anesthetic). After you are asleep, a flexible tube (catheter) may be inserted into your bladder to drain your urine during surgery. The tube is removed before you wake up after surgery. When you are asleep, carbon dioxide gas will be used to inflate your abdomen. This will allow your surgeon to see inside your abdomen and perform your surgery. Three small incisions will be made in your abdomen. Your surgeon will insert a thin, lighted tube (laparoscope) through one of the incisions. Your surgeon will look through the  laparoscope while performing the surgery. Other tools will be inserted through the other incisions. Laparoscopic procedures may not be appropriate when: °· There is major scarring from a previous surgery. °· The patient has bleeding disorders. °· A pregnancy is near term. °· There are other conditions which make the laparoscopic procedure impossible, such as an advanced infection or a ruptured appendix. °If your surgeon feels it is not safe to continue with the laparoscopic procedure, he or she will perform an open surgery instead. This gives the surgeon a larger view and more space to work. Open surgery requires a longer recovery time. After your appendix is removed, your incisions will be closed with stitches (sutures) or skin adhesive. °AFTER THE PROCEDURE °You will be taken to a recovery room. When the anesthesia has worn off, you will be returned to your hospital room. You will be given pain medicines to keep you comfortable. Ask your caregiver how long your hospital stay will be. °Document Released: 07/14/2004 Document Revised: 02/22/2012 Document Reviewed: 06/09/2011 °ExitCare® Patient Information ©2014 [employer]. °.CCS ______[facility] °LAPAROSCOPIC SURGERY: POST OP INSTRUCTIONS °Always review your discharge instruction sheet given to you by the facility where your surgery was performed. °IF YOU HAVE DISABILITY OR FAMILY LEAVE FORMS, YOU MUST BRING THEM TO THE OFFICE FOR PROCESSING.   °DO NOT GIVE THEM TO YOUR DOCTOR. ° °1. A prescription for pain medication may be given to you upon discharge.  Take your pain medication as prescribed, if needed.  If narcotic pain medicine is not needed, then you may take acetaminophen (Tylenol) or ibuprofen (Advil) as needed. °2. Take your usually prescribed medications unless otherwise directed. °3. If you need a refill on your pain medication, please contact   your pharmacy.  They will contact our office to request authorization. Prescriptions will  not be filled after 5pm or on week-ends. °4. You should follow a light diet the first few days after arrival home, such as soup and crackers, etc.  Be sure to include lots of fluids daily. °5. Most patients will experience some swelling and bruising in the area of the incisions.  Ice packs will help.  Swelling and bruising can take several days to resolve.  °6. It is common to experience some constipation if taking pain medication after surgery.  Increasing fluid intake and taking a stool softener (such as Colace) will usually help or prevent this problem from occurring.  A mild laxative (Milk of Magnesia or Miralax) should be taken according to package instructions if there are no bowel movements after 48 hours. °7. Unless discharge instructions indicate otherwise, you may remove your bandages 24-48 hours after surgery, and you may shower at that time.  You may have steri-strips (small skin tapes) in place directly over the incision.  These strips should be left on the skin for 7-10 days.  If your surgeon used skin glue on the incision, you may shower in 24 hours.  The glue will flake off over the next 2-3 weeks.  Any sutures or staples will be removed at the office during your follow-up visit. °8. ACTIVITIES:  You may resume regular (light) daily activities beginning the next day--such as daily self-care, walking, climbing stairs--gradually increasing activities as tolerated.  You may have sexual intercourse when it is comfortable.  Refrain from any heavy lifting or straining until approved by your doctor. °a. You may drive when you are no longer taking prescription pain medication, you can comfortably wear a seatbelt, and you can safely maneuver your car and apply brakes. °b. RETURN TO WORK:  __________________________________________________________ °9. You should see your doctor in the office for a follow-up appointment approximately 2-3 weeks after your surgery.  Make sure that you call for this appointment  within a day or two after you arrive home to insure a convenient appointment time. °10. OTHER INSTRUCTIONS: __________________________________________________________________________________________________________________________ __________________________________________________________________________________________________________________________ °WHEN TO CALL YOUR DOCTOR: °1. Fever over 101.0 °2. Inability to urinate °3. Continued bleeding from incision. °4. Increased pain, redness, or drainage from the incision. °5. Increasing abdominal pain ° °The clinic staff is available to answer your questions during regular business hours.  Please don’t hesitate to call and ask to speak to one of the nurses for clinical concerns.  If you have a medical emergency, go to the nearest emergency room or call 911.  A surgeon from [facility] is always on call at the hospital. °[address], [address], [place], [place]  [address] ? [address] [address], [place], [place]   [address] °[phone] ? [phone] ? FAX [phone] °Web site: [email or web address] ° °

## 2014-02-01 NOTE — Discharge Summary (Signed)
Physician Discharge Summary  Patient ID: Sophia Kelley MRN: [id number] DOB/AGE: Dec 13, 1951 63 y.o.  Admit date: 01/30/2014 Discharge date: 02/01/2014  Admission Diagnoses: Acute appendicitis without mention of peritonitis Chronic constipation  Hypothyroid on supplement.  Body mass index is 44.75  Sleep apnea   Discharge Diagnoses:  Chronic constipation  Hypothyroid on supplement.  Body mass index is 44.75  Sleep apnea  Principal Problem:   Acute appendicitis Active Problems:   OBESITY   HYPOTHYROIDISM   SLEEP APNEA   Chronic constipation   PROCEDURES: Laparoscopic appendectomy, 01/31/14. Dr. Bill Salinas Course: This is a pleasant female who reports waking up yesterday not feeling well. She then started developing nausea and vomiting. She initially thought she had a gastrointestinal virus and then started having pain in the right lower quadrant. She presented to med center [facility] and was found on CAT scan to have bodies consistent with appendicitis. She was transferred here for further care. She denies shortness of breath or chest pain. Bowel movements have been normal. She denies fevers. Her pain in the right lower quadrant is moderate in intensity and sharp. It does not refer anywhere else.  She was seen and taken to the OR later by Dr. Ninfa Linden, early AM.  She did well and returned to the floor.  Post op she was mobilized and her diet was advanced.  She was doing well and sent home after breakfast on 02/01/14.  She does appear to have sleep apnea and we discussed her need to go back and do Mask testing.  From our standpoint she did well and will follow up with Dr. Ninfa Linden in 2 weeks.  Condition on D/C:  Improved  Disposition: 01-Home or Self Care     Medication List         acetaminophen 325 MG tablet  Commonly known as:  TYLENOL  Take 2 tablets (650 mg total) by mouth every 6 (six) hours as needed for mild pain, moderate pain, fever or headache (Do  not take more than 4000 mg per day at home.  it is in your prescription medicine.).     aspirin 81 MG EC tablet  Take 81 mg by mouth every Monday, Wednesday, and Friday.     HYDROcodone-acetaminophen 5-325 MG per tablet  Commonly known as:  NORCO/VICODIN  Take 1-2 tablets by mouth every 4 (four) hours as needed for moderate pain.     ibuprofen 200 MG tablet  Commonly known as:  ADVIL,MOTRIN  You can take 2-3 tablets every 8 hours for pain.     KRILL OIL PO  Take 1 capsule by mouth daily.     levothyroxine 137 MCG tablet  Commonly known as:  SYNTHROID, LEVOTHROID  Take 137 mcg by mouth daily before breakfast.     LINZESS 145 MCG Caps capsule  Generic drug:  Linaclotide  Take 145 mcg by mouth every other day.     VITAMIN D (CHOLECALCIFEROL) PO  Take 1 capsule by mouth every Monday, Wednesday, and Friday.       Follow-up Information   Follow up with [facility], MD. Schedule an appointment as soon as possible for a visit in 2 weeks. (Call and ask for an appointment in 2-3 weeks.)    Specialty:  General Surgery   Contact information:   [address] [place] [address] [phone]       Follow up with Maisie Fus, MD. (Call and let him know you had surgery and follow  up with him for medical issues.)    Specialty:  Obstetrics and Gynecology   Contact information:   [address] [place] [address] [phone]       Signed: Earnstine Regal 02/01/2014, 5:05 PM  Agree with above.  Alphonsa Overall, MD, [facility] Surgery Pager: [phone] Office phone:  [phone]

## 2014-02-01 NOTE — Progress Notes (Signed)
1 Day Post-Op  Subjective: Snoring asleep and on O2.  She does have sleep apnea and needs to be fitted for a mask. Tolerating a regular diet.  Objective: Vital signs in last 24 hours: Temp:  [97.5 F (36.4 C)-98.2 F (36.8 C)] 97.7 F (36.5 C) (02/19 0620) Pulse Rate:  [63-100] 90 (02/19 0620) Resp:  [15-22] 18 (02/19 0620) BP: (110-156)/(63-89) 137/84 mmHg (02/19 0620) SpO2:  [94 %-100 %] 98 % (02/19 0620) Last BM Date: 01/30/14 600 PO Afebrile, VSS No labs Diet: regular Intake/Output from previous day: 02/18 0701 - 02/19 0700 In: 1800 [P.O.:600; I.V.:900; IV Piggyback:300] Out: 300 [Urine:300] Intake/Output this shift:    General appearance: alert, cooperative and no distress Resp: clear to auscultation bilaterally GI: soft tender, large abdomen, port sites OK, tolerating regular diet.  Lab Results:   Recent Labs  01/30/14 2206  WBC 10.9*  HGB 15.2*  HCT 46.5*  PLT 165    BMET  Recent Labs  01/30/14 2206  NA 138  K 4.3  CL 99  CO2 26  GLUCOSE 168*  BUN 13  CREATININE 0.80  CALCIUM 9.4   PT/INR No results found for this basename: LABPROT, INR,  in the last 72 hours   Recent Labs Lab 01/30/14 2206  AST 16  ALT 16  ALKPHOS 71  BILITOT 0.3  PROT 7.4  ALBUMIN 3.8     Lipase     Component Value Date/Time   LIPASE 25 01/30/2014 2206     Studies/Results: Ct Abdomen Pelvis W Contrast  01/30/2014   CLINICAL DATA:  Mid abdominal pain.  Nausea, vomiting.  EXAM: CT ABDOMEN AND PELVIS WITH CONTRAST  TECHNIQUE: Multidetector CT imaging of the abdomen and pelvis was performed using the standard protocol following bolus administration of intravenous contrast.  CONTRAST:  2mL OMNIPAQUE IOHEXOL 300 MG/ML SOLN, 182mL OMNIPAQUE IOHEXOL 300 MG/ML SOLN  COMPARISON:  DG ABD ACUTE W/CHEST dated 07/02/2011  FINDINGS: Minimal linear subsegmental atelectasis in the lung bases. No effusions. Heart is normal size.  Diffuse low density throughout the liver compatible  with fatty infiltration. No focal abnormality. Gallbladder, spleen, pancreas, adrenals and kidneys are unremarkable. Stomach, large and small bowel grossly unremarkable. No evidence of bowel obstruction.  High-density material noted within the mid portion of the appendix which could represent contrast or appendicolith. The distal appendix is mildly dilated at 12 mm. No surrounding inflammatory changes.  Prior hysterectomy. No adnexal masses. Urinary bladder is unremarkable. Aorta is normal caliber. Incidentally noted is a retro aortic left renal vein.  No acute bony abnormality. Degenerative disc and facet disease in the lower lumbar spine.  IMPRESSION: High-density material within the mid appendix with mild dilatation of the distal appendix. I cannot exclude an appendicolith with early tip appendicitis. No surrounding inflammatory change noted. Recommend clinical correlation for signs and symptoms of appendicitis.  Mild diffuse fatty infiltration of the liver.   Electronically Signed   By: Rolm Baptise M.D.   On: 01/30/2014 23:37    Medications: . acetaminophen  1,000 mg Intravenous Q6H  . heparin subcutaneous  5,000 Units Subcutaneous 3 times per day  . levothyroxine  137 mcg Oral QAC breakfast  . Linaclotide  145 mcg Oral QODAY    Assessment/Plan Acute appendicitis -  s/p Appendectomy LAPAROSCOPIC, this AM 01/31/14 Dr. Ninfa Linden  Chronic constipation  Hypothyroid on supplement.  Body mass index is 44.75 Sleep apnea   Plan:  Home today, I suggested she get her sleep study completed.  LOS: 2 days    JENNINGS,WILLARD 02/01/2014  Agree with above.  Alphonsa Overall, MD, [facility] Surgery Pager: [phone] Office phone:  [phone]

## 2014-02-02 ENCOUNTER — Other Ambulatory Visit (INDEPENDENT_AMBULATORY_CARE_PROVIDER_SITE_OTHER): Payer: Self-pay | Admitting: Surgery

## 2014-02-02 MED ORDER — ONDANSETRON HCL 4 MG PO TABS: 4.0000 mg | ORAL_TABLET | Freq: Three times a day (TID) | ORAL | Status: DC | PRN

## 2014-02-02 NOTE — Telephone Encounter (Signed)
Can call in Zofran 4mg    1 po q 6 hrs prn nausea  #20 with 1 refill

## 2014-02-13 ENCOUNTER — Encounter (INDEPENDENT_AMBULATORY_CARE_PROVIDER_SITE_OTHER): Payer: Self-pay | Admitting: Surgery

## 2014-02-13 ENCOUNTER — Ambulatory Visit (INDEPENDENT_AMBULATORY_CARE_PROVIDER_SITE_OTHER): Payer: BC Managed Care – PPO | Admitting: Surgery

## 2014-02-13 VITALS — BP 132/80 | HR 78 | Temp 98.4°F | Resp 20 | Ht 66.0 in | Wt 277.0 lb

## 2014-02-13 DIAGNOSIS — Z09 Encounter for follow-up examination after completed treatment for conditions other than malignant neoplasm: Secondary | ICD-10-CM

## 2014-02-13 NOTE — Progress Notes (Signed)
Subjective:     Patient ID: Sophia Kelley, female   DOB: June 20, 1951, 63 y.o.   MRN: [id number]  HPI She is here for her first postoperative visit status post laparoscopic appendectomy. She is doing well and is no complaints.  Review of Systems     Objective:   Physical Exam On exam, her incisions are healing well. Final pathology showed acute appendicitis with no evidence of malignancy    Assessment:     Patient stable postop     Plan:     She may resume her normal activity. We will see her back as needed

## 2014-03-21 ENCOUNTER — Encounter: Payer: Self-pay | Admitting: *Deleted

## 2014-04-13 ENCOUNTER — Ambulatory Visit (INDEPENDENT_AMBULATORY_CARE_PROVIDER_SITE_OTHER): Payer: BC Managed Care – PPO | Admitting: Internal Medicine

## 2014-04-13 ENCOUNTER — Encounter: Payer: Self-pay | Admitting: Internal Medicine

## 2014-04-13 VITALS — BP 122/88 | HR 88 | Ht 65.0 in | Wt 276.2 lb

## 2014-04-13 DIAGNOSIS — K5989 Other specified functional intestinal disorders: Secondary | ICD-10-CM

## 2014-04-13 DIAGNOSIS — Z86010 Personal history of colonic polyps: Secondary | ICD-10-CM

## 2014-04-13 DIAGNOSIS — K598 Other specified functional intestinal disorders: Principal | ICD-10-CM

## 2014-04-13 MED ORDER — LINACLOTIDE 290 MCG PO CAPS: 290.0000 ug | ORAL_CAPSULE | Freq: Every day | ORAL | Status: DC

## 2014-04-13 NOTE — Progress Notes (Signed)
Sophia Kelley 11/14/1951 [id number]  Note: This dictation was prepared with Dragon digital system. Any transcriptional errors that result from this procedure are unintentional.   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 Last appointment May 2014. Sitzmarks study in 2008 was negative. She was on mineral oil and MiraLax and  was started on Linzess 145 mcg daily and has been very satisfied with the results. She would like to increase the dose to 290 mcg daily. She denies rectal bleeding. There is a history of colon polyps on colonoscopy in 2001 when she had adenomatous polyp, in 2003 when she had a hyperplastic polyp removed and in January 2011 multiple hyperplastic polyps. She has difficulty evacuating stool. She pushes and uses manual pressure .Marland Kitchen     Past Medical History  Diagnosis Date  . Hyperlipidemia   . Hx of adenomatous colonic polyps   . IBS (irritable bowel syndrome)   . Hemorrhoids   . Colonic inertia   . Active smoker   . OSA (obstructive sleep apnea)     Suspected  . Obesity   . Allergic rhinitis     RAST positive, IgE 1569 from 01/23/2011  . Dyspnea     PFT 02/03/11>>FEV1 2.64(107%, FEV1% 76, TLC 5.44 (103%, DLCO 67%, no BD  . Hypothyroidism   . Vitamin B12 deficiency   . PVD (peripheral vascular disease)   . Hemorrhoids   . Lumbar radiculopathy   . Colonic inertia   . Arthritis   . COPD (chronic obstructive pulmonary disease)   . Osteoporosis   . Nasal congestion   . Trouble swallowing   . Cough   . Rash   . Constipation   . Visual disturbance   . Hernia     lower back  . Vitamin D deficiency   . Chronic constipation 02/01/2014  . Fatty liver     Past Surgical History  Procedure Laterality Date  . Cesarean section      x 2  . Nasal septum surgery    . Abdominal hysterectomy  1990  . Laparoscopic appendectomy N/A 01/31/2014    Procedure: APPENDECTOMY LAPAROSCOPIC;  Surgeon: Harl Bowie,  MD;  Location: [facility];  Service: General;  Laterality: N/A;    No Known Allergies  Family history and social history have been reviewed.  Review of Systems: Denies heartburn nausea vomiting weight loss  The remainder of the 10 point ROS is negative except as outlined in the H&P  Physical Exam: General Appearance Well developed, in no distress, obese Psychological Normal mood and affect  Assessment and Plan:   63 year old white female with the colonic inertia and suspected pelvic floor dysfunction. She is unable to evacuate. She would like to be referred for evaluation of pelvic floor dysfunction. We will  do so and also will increase Linzess to 290 mcg daily. She will remain on high fiber diet. She will increase physical activity which has been limited low due to  arthritic problems. She would like to have the colonoscopy moved to an earlier date. We will go ahead and schedule for followup of colon polyps    Lafayette Dragon 04/13/2014

## 2014-04-13 NOTE — Patient Instructions (Addendum)
You have been given a separate informational sheet regarding your tobacco use, the importance of quitting and local resources to help you quit.  It has been recommended to you by your physician that you have a(n) colonoscopy completed. Unfortunately, we are unable to schedule an appointment for procedure(s) today as we do not yet have a schedule for the time you will need the procedure. Please contact our office at [phone] in 1-2 weeks to get your procedure scheduled.  We will be in contact with you regarding an appointment with Ileana Roup at [facility].  We have sent the following medications to your pharmacy for you to pick up at your convenience: Linzess 290 mcg daily  CC:Dr Evette Cristal, Ileana Roup, PT

## 2014-04-14 ENCOUNTER — Encounter: Payer: Self-pay | Admitting: Internal Medicine

## 2014-04-17 ENCOUNTER — Encounter: Payer: Self-pay | Admitting: Internal Medicine

## 2014-05-16 ENCOUNTER — Telehealth: Payer: Self-pay | Admitting: Internal Medicine

## 2014-05-16 NOTE — Telephone Encounter (Signed)
Per pharmacy, they DO have linzess script. Rx was placed on hold since patient never came to pick up the script. Left message to advise patient.

## 2014-05-29 ENCOUNTER — Ambulatory Visit (AMBULATORY_SURGERY_CENTER): Payer: Self-pay | Admitting: *Deleted

## 2014-05-29 VITALS — Ht 65.5 in | Wt 274.4 lb

## 2014-05-29 DIAGNOSIS — Z86010 Personal history of colonic polyps: Secondary | ICD-10-CM

## 2014-05-29 MED ORDER — MOVIPREP 100 G PO SOLR: ORAL | Status: DC

## 2014-05-29 NOTE — Progress Notes (Signed)
No allergies to eggs or soy. No problems with anesthesia.  Pt given Emmi instructions for colonoscopy  No oxygen use  No diet drug use  

## 2014-06-04 ENCOUNTER — Encounter: Payer: Self-pay | Admitting: Internal Medicine

## 2014-06-12 ENCOUNTER — Ambulatory Visit (AMBULATORY_SURGERY_CENTER): Payer: BC Managed Care – PPO | Admitting: Internal Medicine

## 2014-06-12 ENCOUNTER — Encounter: Payer: Self-pay | Admitting: Internal Medicine

## 2014-06-12 VITALS — BP 129/91 | HR 65 | Temp 97.7°F | Resp 19 | Ht 66.0 in | Wt 274.0 lb

## 2014-06-12 DIAGNOSIS — D126 Benign neoplasm of colon, unspecified: Secondary | ICD-10-CM

## 2014-06-12 DIAGNOSIS — Z86010 Personal history of colonic polyps: Secondary | ICD-10-CM

## 2014-06-12 HISTORY — PX: COLONOSCOPY WITH PROPOFOL: SHX5780

## 2014-06-12 MED ORDER — SODIUM CHLORIDE 0.9 % IV SOLN: 500.0000 mL | INTRAVENOUS | Status: DC

## 2014-06-12 NOTE — Patient Instructions (Signed)
YOU HAD AN ENDOSCOPIC PROCEDURE TODAY AT THE [facility]: Refer to the procedure report that was given to you for any specific questions about what was found during the examination.  If the procedure report does not answer your questions, please call your gastroenterologist to clarify.  If you requested that your care partner not be given the details of your procedure findings, then the procedure report has been included in a sealed envelope for you to review at your convenience later.  YOU SHOULD EXPECT: Some feelings of bloating in the abdomen. Passage of more gas than usual.  Walking can help get rid of the air that was put into your GI tract during the procedure and reduce the bloating. If you had a lower endoscopy (such as a colonoscopy or flexible sigmoidoscopy) you may notice spotting of blood in your stool or on the toilet paper. If you underwent a bowel prep for your procedure, then you may not have a normal bowel movement for a few days.  DIET: Your first meal following the procedure should be a light meal and then it is ok to progress to your normal diet.  A half-sandwich or bowl of soup is an example of a good first meal.  Heavy or fried foods are harder to digest and may make you feel nauseous or bloated.  Likewise meals heavy in dairy and vegetables can cause extra gas to form and this can also increase the bloating.  Drink plenty of fluids but you should avoid alcoholic beverages for 24 hours.  ACTIVITY: Your care partner should take you home directly after the procedure.  You should plan to take it easy, moving slowly for the rest of the day.  You can resume normal activity the day after the procedure however you should NOT DRIVE or use heavy machinery for 24 hours (because of the sedation medicines used during the test).    SYMPTOMS TO REPORT IMMEDIATELY: A gastroenterologist can be reached at any hour.  During normal business hours, 8:30 AM to 5:00 PM Monday through Friday,  call [phone].  After hours and on weekends, please call the GI answering service at [phone] who will take a message and have the physician on call contact you.   Following lower endoscopy (colonoscopy or flexible sigmoidoscopy):  Excessive amounts of blood in the stool  Significant tenderness or worsening of abdominal pains  Swelling of the abdomen that is new, acute  Fever of 100F or higher   FOLLOW UP: If any biopsies were taken you will be contacted by phone or by letter within the next 1-3 weeks.  Call your gastroenterologist if you have not heard about the biopsies in 3 weeks.  Our staff will call the home number listed on your records the next business day following your procedure to check on you and address any questions or concerns that you may have at that time regarding the information given to you following your procedure. This is a courtesy call and so if there is no answer at the home number and we have not heard from you through the emergency physician on call, we will assume that you have returned to your regular daily activities without incident.  SIGNATURES/CONFIDENTIALITY: You and/or your care partner have signed paperwork which will be entered into your electronic medical record.  These signatures attest to the fact that that the information above on your After Visit Summary has been reviewed and is understood.  Full responsibility of the confidentiality of   this discharge information lies with you and/or your care-partner.   Resume medications. Information given on polyps and high fiber diet with discharge instructions. 

## 2014-06-12 NOTE — Progress Notes (Signed)
Patient stable, vss, report to sheila, rn 

## 2014-06-12 NOTE — Progress Notes (Signed)
Heard relieves [employer]

## 2014-06-12 NOTE — Op Note (Signed)
[facility]  [address] [place], [id number]   COLONOSCOPY PROCEDURE REPORT  PATIENT: Sophia Kelley, Sophia Kelley  MR#: [id number] BIRTHDATE: 1951/08/15 , 62  yrs. old GENDER: Female ENDOSCOPIST: Lafayette Dragon, MD REFERRED RJ:JOACZY Nori Riis, M.D. PROCEDURE DATE:  06/12/2014 PROCEDURE:   Colonoscopy with cold biopsy polypectomy First Screening Colonoscopy - Avg.  risk and is 50 yrs.  old or older - No.  Prior Negative Screening - Now for repeat screening. N/A  History of Adenoma - Now for follow-up colonoscopy & has been > or = to 3 yrs.  Yes hx of adenoma.  Has been 3 or more years since last colonoscopy.  Polyps Removed Today? Yes. ASA CLASS:   Class II INDICATIONS:hx fo colonic inrtia, prior colon 2001-adenom. p;olyp,2003, hyperpl.  polyp,2011 several hyperplastic polyps.Marland Kitchen MEDICATIONS: MAC sedation, administered by CRNA and propofol (Diprivan) 350mg  IV  DESCRIPTION OF PROCEDURE:   After the risks benefits and alternatives of the procedure were thoroughly explained, informed consent was obtained.  A digital rectal exam revealed no abnormalities of the rectum.   The LB [id number]-H190 [id number]  endoscope was introduced through the anus and advanced to the cecum, which was identified by both the appendix and ileocecal valve. No adverse events experienced.   The quality of the prep was good, using MoviPrep  The instrument was then slowly withdrawn as the colon was fully examined.      COLON FINDINGS: Multiple sessile polyps ranging between 3-61mm in size were found in the sigmoid colon and descending colon.  A polypectomy was performed with cold forceps.  The resection was complete and the polyp tissue was completely retrieved.cecal pouch was visualized with the help of biopsy forceps which show moved the bottom of the cecal pouch into full view  Retroflexed views revealed no abnormalities. The time to cecum=13 minutes 14 seconds. Withdrawal time=9 minutes 0 seconds.  The scope  was withdrawn and the procedure completed. COMPLICATIONS: There were no complications.  ENDOSCOPIC IMPRESSION: Multiple sessile polyps ranging between 3-81mm in size were found in the sigmoid colon and descending colon; polypectomy was performed with cold forceps  RECOMMENDATIONS: 1.  Await pathology results 2.  continue Linzess 290 ug qd, high fiber diet 3. recall colonoscopy pending path report  eSigned:  Lafayette Dragon, MD 06/12/2014 1:16 PM   cc:   PATIENT NAME:  Sophia Kelley, Sophia Kelley MR#: [id number]

## 2014-06-12 NOTE — Progress Notes (Signed)
Called to room to assist during endoscopic procedure.  Patient ID and intended procedure confirmed with present staff. Received instructions for my participation in the procedure from the performing physician.  

## 2014-06-13 ENCOUNTER — Telehealth: Payer: Self-pay | Admitting: *Deleted

## 2014-06-13 NOTE — Telephone Encounter (Signed)
  Follow up Call-  Call back number 06/12/2014  Post procedure Call Back phone  # [phone]  Permission to leave phone message Yes     Patient questions:  Message left to call if necessary.

## 2014-06-19 ENCOUNTER — Encounter: Payer: Self-pay | Admitting: Internal Medicine

## 2014-06-21 ENCOUNTER — Encounter: Payer: Self-pay | Admitting: *Deleted

## 2014-07-30 ENCOUNTER — Telehealth: Payer: Self-pay | Admitting: Internal Medicine

## 2014-07-30 NOTE — Telephone Encounter (Signed)
Patient states she has a large stomach hernia and Dr. Olevia Perches is aware of it. She is asking for a referral to Dr. Rush Farmer to see about surgical repair. Please, advise.

## 2014-07-31 NOTE — Telephone Encounter (Signed)
Patient's husband left a message that she got the referral she needed.

## 2014-07-31 NOTE — Telephone Encounter (Signed)
Left a message for patient to call back. 

## 2014-07-31 NOTE — Telephone Encounter (Signed)
I have reviewed her records. I do not recommend referral to surgery  For repair of hernia. She may want to be self referred.

## 2014-08-08 ENCOUNTER — Encounter: Payer: Self-pay | Admitting: Internal Medicine

## 2014-08-13 ENCOUNTER — Other Ambulatory Visit (INDEPENDENT_AMBULATORY_CARE_PROVIDER_SITE_OTHER): Payer: Self-pay | Admitting: *Deleted

## 2014-08-13 ENCOUNTER — Ambulatory Visit (INDEPENDENT_AMBULATORY_CARE_PROVIDER_SITE_OTHER): Payer: BC Managed Care – PPO | Admitting: Surgery

## 2014-08-13 ENCOUNTER — Encounter (INDEPENDENT_AMBULATORY_CARE_PROVIDER_SITE_OTHER): Payer: Self-pay | Admitting: Surgery

## 2014-08-13 VITALS — BP 132/80 | HR 74 | Resp 18 | Ht 66.0 in | Wt 270.0 lb

## 2014-08-13 DIAGNOSIS — R109 Unspecified abdominal pain: Secondary | ICD-10-CM

## 2014-08-13 LAB — BASIC METABOLIC PANEL
BUN: 11 mg/dL (ref 6–23)
CO2: 25 mEq/L (ref 19–32)
Calcium: 9 mg/dL (ref 8.4–10.5)
Chloride: 104 mEq/L (ref 96–112)
Creat: 0.79 mg/dL (ref 0.50–1.10)
Glucose, Bld: 84 mg/dL (ref 70–99)
POTASSIUM: 4.6 meq/L (ref 3.5–5.3)
SODIUM: 138 meq/L (ref 135–145)

## 2014-08-13 NOTE — Addendum Note (Signed)
Addended by: Illene Regulus on: 08/13/2014 02:36 PM   Modules accepted: Orders

## 2014-08-13 NOTE — Progress Notes (Signed)
Subjective:     Patient ID: Sophia Kelley, female   DOB: [birth year], 63 y.o.   MRN: [id number]  HPI This is a patient who I operated on in February for acute appendicitis. She has seen Dr. Zella Richer. in the past for possible hernia. According to his note, he did not feel a hernia.  There was no evidence of a hernia on her CAT scan showed appendicitis in February. She reports that her gastroenterologist told her she had hernia and because of her abdominal pain came back to our office. She reports moderate diffuse abdominal pain with no obstructive symptoms.   Review of Systems     Objective:   Physical Exam On exam, she is morbidly obese with a very large abdomen in what appears to be rectus diastases. There may be a small fascial defect at the umbilicus. With her standing I cannot feel any large fascial defect anywhere else.    Assessment:     Abdominal pain of uncertain etiology     Plan:     Given her morbid obesity, rectus diastases, etc. It is difficult to tell whether or not there is true fascial defect present. Given her size, she needs a CAT scan of the abdomen and pelvis to evaluate the abdominal wall layers to determine whether there is indeed a true fascial defect/hernia present. I will see her back after the CT scan.

## 2014-08-22 ENCOUNTER — Ambulatory Visit
Admission: RE | Admit: 2014-08-22 | Discharge: 2014-08-22 | Disposition: A | Discharge status: Home / Self Care | Payer: BC Managed Care – PPO | Source: Ambulatory Visit | Attending: Surgery | Admitting: Surgery

## 2014-08-22 MED ORDER — IOHEXOL 300 MG/ML  SOLN
125.0000 mL | Freq: Once | INTRAMUSCULAR | Status: AC | PRN
  Administered 2014-08-22: 125 mL via INTRAVENOUS

## 2015-03-04 ENCOUNTER — Encounter: Payer: Self-pay | Admitting: *Deleted

## 2015-03-05 ENCOUNTER — Telehealth: Payer: Self-pay | Admitting: *Deleted

## 2015-03-05 MED ORDER — LINACLOTIDE 290 MCG PO CAPS: 290.0000 ug | ORAL_CAPSULE | Freq: Every day | ORAL | Status: DC

## 2015-03-05 NOTE — Telephone Encounter (Signed)
Prior Authorization was approved from [employer] [place] for [employer]. Approval is from now (03/04/15) until 09/04/15 or until coverage for the medication is no longer available under the benefit plan. Sent Rx for Linzess, 290 mcg, #30 with 4 refills to [facility] in [place], [place] on 03/05/15.

## 2015-04-16 ENCOUNTER — Ambulatory Visit (INDEPENDENT_AMBULATORY_CARE_PROVIDER_SITE_OTHER): Payer: 59 | Admitting: Internal Medicine

## 2015-04-16 ENCOUNTER — Telehealth: Payer: Self-pay | Admitting: Internal Medicine

## 2015-04-16 ENCOUNTER — Encounter: Payer: Self-pay | Admitting: Internal Medicine

## 2015-04-16 VITALS — BP 122/80 | HR 88 | Ht 65.0 in | Wt 273.2 lb

## 2015-04-16 DIAGNOSIS — K439 Ventral hernia without obstruction or gangrene: Secondary | ICD-10-CM

## 2015-04-16 DIAGNOSIS — K599 Functional intestinal disorder, unspecified: Secondary | ICD-10-CM | POA: Diagnosis not present

## 2015-04-16 MED ORDER — LINACLOTIDE 290 MCG PO CAPS: 290.0000 ug | ORAL_CAPSULE | Freq: Every day | ORAL | Status: DC

## 2015-04-16 NOTE — Telephone Encounter (Signed)
Patient will have to be referred to surgeon by her PCP.

## 2015-04-16 NOTE — Progress Notes (Signed)
Sophia Kelley 08-24-1951 [id number]  Note: This dictation was prepared with Dragon digital system. Any transcriptional errors that result from this procedure are unintentional.   History of Present Illness: This is a  64 year old white female with  colonic inertia and chronic constipation. Negative Sitzmarks study in 2008. Recent colonoscopy in June 2015 showed hyperplastic polyp. Prior colonoscopies in 2001 showed adenomatous polyp. In 2003 hyperplastic polyp, in January 2011 multiple hyperplastic polyps. She has been on the Linzess 290 g daily. She has good results. She is here today because of progression of large ventral hernia. Patient is obese and the hernia is causing problems when she strains for stool or  when she bends over or just simply when she walks around. She would like to have this surgically corrected.. She had prior appendectomy    Past Medical History  Diagnosis Date  . Hyperlipidemia   . Hx of adenomatous colonic polyps   . IBS (irritable bowel syndrome)   . Hemorrhoids   . Colonic inertia   . Active smoker   . OSA (obstructive sleep apnea)     Suspected  . Obesity   . Allergic rhinitis     RAST positive, IgE 1569 from 01/23/2011  . Dyspnea     PFT 02/03/11>>FEV1 2.64(107%, FEV1% 76, TLC 5.44 (103%, DLCO 67%, no BD  . Hypothyroidism   . Vitamin B12 deficiency   . PVD (peripheral vascular disease)   . Hemorrhoids   . Lumbar radiculopathy   . Colonic inertia   . Arthritis   . COPD (chronic obstructive pulmonary disease)   . Osteoporosis   . Nasal congestion   . Trouble swallowing   . Cough   . Rash   . Constipation   . Visual disturbance   . Hernia     lower back  . Vitamin D deficiency   . Chronic constipation 02/01/2014  . Fatty liver   . Sleep apnea     no cpap  . Allergy     Past Surgical History  Procedure Laterality Date  . Cesarean section  1980, 1988    x 2  . Nasal septum surgery  1984  . Abdominal hysterectomy  1990  . Laparoscopic  appendectomy N/A 01/31/2014    Procedure: APPENDECTOMY LAPAROSCOPIC;  Surgeon: Harl Bowie, MD;  Location: [facility];  Service: General;  Laterality: N/A;    Allergies  Allergen Reactions  . Codeine Rash    Family history and social history have been reviewed.  Review of Systems: Positive for constipation. Positive for straining. Abdominal discomfort due to hernia  The remainder of the 10 point ROS is negative except as outlined in the H&P  Physical Exam: General Appearance Well developed, in no distress Eyes  Non icteric  HEENT  Non traumatic, normocephalic  Mouth No lesion, tongue papillated, no cheilosis Neck Supple without adenopathy, thyroid not enlarged, no carotid bruits, no JVD Lungs Clear to auscultation bilaterally COR Normal S1, normal S2, regular rhythm, no murmur, quiet precordium Abdomen arch massively obese abdomen with soft bowel sounds. No tenderness. Large ventral hernia at the rectus diaphysis extending below the umbilicus. Easily reducible. Rectal not done Extremities  No pedal edema Skin No lesions Neurological Alert and oriented x 3 Psychological Normal mood and affect  Assessment and Plan:   64 year old white female who is obese and who has enlarging ventral hernia which seemed to bother her when she strains for chronic constipation. He is currently taking Linzess 290 g daily with good results.  She would like a surgical opinion. We will set up a surgical referral. In the meantime we will refill Linzess 290 ug for one year. She is up-to-date on colonoscopy  Personal history of hyperplastic polyps recall colonoscopy in 7-10 years    Delfin Edis 04/16/2015

## 2015-04-16 NOTE — Patient Instructions (Addendum)
You have been given a separate informational sheet regarding your tobacco use, the importance of quitting and local resources to help you quit.  We have sent the following medications to your pharmacy for you to pick up at your convenience: Linzess.   Your appointment with Dr. Zella Richer at [facility] surgery is on 05/02/15 at 1:30pm. You need to arrive at 1:00pm for registration. If you need to reschedule or cancel please contact them at [phone].  Cc: Dr Dory Horn, Dr  Scarlette Ar,

## 2015-04-16 NOTE — Telephone Encounter (Signed)
Spoke with Rod Holler at [employer] and they do not take [employer] Left a message for patient to back.

## 2015-04-17 ENCOUNTER — Telehealth: Payer: Self-pay | Admitting: Internal Medicine

## 2015-04-17 NOTE — Telephone Encounter (Signed)
Patient given information. She will call her PCP and get referral for a surgeon.

## 2015-04-17 NOTE — Telephone Encounter (Signed)
Spoke with patient's husband and gave him [facility] number and "Rod Holler" to call about OV that was cancelled due to her insurance not being accepted.

## 2015-04-17 NOTE — Telephone Encounter (Signed)
Left a message for patient to call back. 

## 2015-05-06 ENCOUNTER — Ambulatory Visit (INDEPENDENT_AMBULATORY_CARE_PROVIDER_SITE_OTHER): Payer: 59 | Admitting: Internal Medicine

## 2015-05-06 ENCOUNTER — Encounter: Payer: Self-pay | Admitting: Internal Medicine

## 2015-05-06 VITALS — BP 130/84 | HR 84 | Ht 66.0 in | Wt 275.1 lb

## 2015-05-06 DIAGNOSIS — R079 Chest pain, unspecified: Secondary | ICD-10-CM

## 2015-05-06 DIAGNOSIS — Z13220 Encounter for screening for lipoid disorders: Secondary | ICD-10-CM

## 2015-05-06 DIAGNOSIS — G4733 Obstructive sleep apnea (adult) (pediatric): Secondary | ICD-10-CM

## 2015-05-06 DIAGNOSIS — E785 Hyperlipidemia, unspecified: Secondary | ICD-10-CM

## 2015-05-06 DIAGNOSIS — Z79899 Other long term (current) drug therapy: Secondary | ICD-10-CM

## 2015-05-06 DIAGNOSIS — R0789 Other chest pain: Secondary | ICD-10-CM

## 2015-05-06 DIAGNOSIS — R0602 Shortness of breath: Secondary | ICD-10-CM

## 2015-05-06 DIAGNOSIS — Z720 Tobacco use: Secondary | ICD-10-CM

## 2015-05-06 DIAGNOSIS — G4730 Sleep apnea, unspecified: Secondary | ICD-10-CM

## 2015-05-06 DIAGNOSIS — R002 Palpitations: Secondary | ICD-10-CM | POA: Diagnosis not present

## 2015-05-06 DIAGNOSIS — F17200 Nicotine dependence, unspecified, uncomplicated: Secondary | ICD-10-CM

## 2015-05-06 NOTE — Progress Notes (Signed)
OFFICE NOTE  Chief Complaint:  Shortness of breath, palpitations, leg pain, chest pain, swelling, lightheadedness, dizziness  Primary Care Physician: Nilda Simmer, MD  HPI:  Sophia Kelley is a pleasant but anxious 64 year old female who previously saw Dr. Aundra Dubin in 2011/2012 for the above listed complaints. He recommended a sleep study and echocardiogram. The echo showed normal systolic function no wall motion abnormalities. She underwent a sleep study which showed severe sleep apnea, but reported her husband was having some medical problems at the time and she never bothered to get fitted for CPAP. That was 4 years ago. She does describe very poor sleep at night and fatigue during the day. She is morbidly obese with a thick neck. Her EPWSS is greater than 10. She does feel fatigued during the day and has witnessed apnea and snoring. He is a very good candidate for CPAP based on the fact that she had documented sleep apnea and her study in 2012. She reports palpitations at night which could be related to sleep apnea. She also has chest pain and shortness of breath with exertion. This may be related to weight, but she does have multiple coronary risk factors. There is a strong family history of premature coronary disease. Is also significant anxiety and this is yet to be addressed from what I can tell. She does not take medication or seek counseling for this. She's had very difficult time losing weight and had reportedly failed Weight Watchers in the past. She's been a smoker for 10 or 11 years, but is quick to say not a "chain smoker".  PMHx:  Past Medical History  Diagnosis Date  . Hyperlipidemia   . Hx of adenomatous colonic polyps   . IBS (irritable bowel syndrome)   . Hemorrhoids   . Colonic inertia   . Active smoker   . OSA (obstructive sleep apnea)     Suspected  . Obesity   . Allergic rhinitis     RAST positive, IgE 1569 from 01/23/2011  . Dyspnea     PFT 02/03/11>>FEV1 2.64(107%,  FEV1% 76, TLC 5.44 (103%, DLCO 67%, no BD  . Hypothyroidism   . Vitamin B12 deficiency   . PVD (peripheral vascular disease)   . Hemorrhoids   . Lumbar radiculopathy   . Colonic inertia   . Arthritis   . COPD (chronic obstructive pulmonary disease)   . Osteoporosis   . Nasal congestion   . Trouble swallowing   . Cough   . Rash   . Constipation   . Visual disturbance   . Hernia     lower back  . Vitamin D deficiency   . Chronic constipation 02/01/2014  . Fatty liver   . Sleep apnea     no cpap  . Allergy     Past Surgical History  Procedure Laterality Date  . Cesarean section  1980, 1988    x 2  . Nasal septum surgery  1984  . Abdominal hysterectomy  1990  . Laparoscopic appendectomy N/A 01/31/2014    Procedure: APPENDECTOMY LAPAROSCOPIC;  Surgeon: Harl Bowie, MD;  Location: [facility];  Service: General;  Laterality: N/A;    FAMHx:  Family History  Problem Relation Age of Onset  . Heart attack Father 48    x3  . Allergies Father   . Prostate cancer Brother   . Colon cancer Neg Hx   . Hypertension Mother     SOCHx:   reports that she has been smoking Cigarettes.  She has a 11.1 pack-year smoking history. She has never used smokeless tobacco. She reports that she does not drink alcohol or use illicit drugs.  ALLERGIES:  Allergies  Allergen Reactions  . Codeine Rash    ROS: A comprehensive review of systems was negative except for: Constitutional: positive for fatigue Respiratory: positive for dyspnea on exertion Cardiovascular: positive for chest pain and palpitations Behavioral/Psych: positive for anxiety  HOME MEDS: Current Outpatient Prescriptions  Medication Sig Dispense Refill  . acetaminophen (TYLENOL) 325 MG tablet Take 2 tablets (650 mg total) by mouth every 6 (six) hours as needed for mild pain, moderate pain, fever or headache (Do not take more than 4000 mg per day at home.  it is in your prescription medicine.).    Marland Kitchen aspirin 81 MG EC tablet  Take 81 mg by mouth daily.     . Cholecalciferol (VITAMIN D-3) 1000 UNITS CAPS Take 2,000 Units by mouth daily.    Marland Kitchen ibuprofen (ADVIL,MOTRIN) 200 MG tablet You can take 2-3 tablets every 8 hours for pain. 30 tablet 0  . KRILL OIL PO Take 1 capsule by mouth daily.    Marland Kitchen levothyroxine (SYNTHROID, LEVOTHROID) 137 MCG tablet Take 1 tablet by mouth daily.    . Linaclotide (LINZESS) 290 MCG CAPS capsule Take 1 capsule (290 mcg total) by mouth daily. 30 capsule 10  . Multiple Vitamins-Minerals (CENTRUM ADULTS PO) Take by mouth daily.    . vitamin B-12 (CYANOCOBALAMIN) 1000 MCG tablet Take 1,000 mcg by mouth daily.     Current Facility-Administered Medications  Medication Dose Route Frequency Provider Last Rate Last Dose  . cyanocobalamin ((VITAMIN B-12)) injection 1,000 mcg  1,000 mcg Intramuscular Once Janith Lima, MD      . pneumococcal 23 valent vaccine (PNU-IMMUNE) injection 0.5 mL  0.5 mL Intramuscular Once Janith Lima, MD        LABS/IMAGING: No results found for this or any previous visit (from the past 48 hour(s)). No results found.  WEIGHTS: Wt Readings from Last 3 Encounters:  05/06/15 275 lb 1.6 oz (124.785 kg)  04/16/15 273 lb 4 oz (123.945 kg)  08/13/14 270 lb (122.471 kg)    VITALS: BP 130/84 mmHg  Pulse 84  Ht 5\' 6"  (1.676 m)  Wt 275 lb 1.6 oz (124.785 kg)  BMI 44.42 kg/m2  EXAM: General appearance: alert and no distress Neck: no carotid bruit and no JVD Lungs: clear to auscultation bilaterally Heart: regular rate and rhythm, S1, S2 normal, no murmur, click, rub or gallop Abdomen: soft, non-tender; bowel sounds normal; no masses,  no organomegaly and morbidly obese Extremities: edema trace edema, venous congestion Pulses: 2+ and symmetric Skin: Skin color, texture, turgor normal. No rashes or lesions Neurologic: Grossly normal Psych: Anxious  EKG: Normal sinus rhythm at 84, incomplete right bundle branch block  ASSESSMENT: 1. Chest pain/shortness of  breath 2. Morbid obesity 3. Obstructive sleep apnea-not on CPAP since diagnosis in 2012 4. Dyslipidemia 5. Hypothyroidism 6. Palpitations 7. Anxiety  PLAN: 1.   Mr. Wurtzel has a number of cardiac risk factors and is describing a very atypical chest pain which is sharp like pins and needles. She also reports shortness of breath which I think is multifactorial. A lot of this may be due to weight which is been going up over several years. She has palpitations at night which could be related to sleep apnea. This was diagnosed in 2012, however, she's not been treated for it. I recommend a repeat split night  sleep study for which she tells me she'll be compliant on attending. I've also recommended that she reach out to the [facility] for their comprehensive weight management program. Burnis Medin get a Lexiscan nuclear stress test for chest pain and shortness of breath. She's never had an assessment of her chest pain, although some of the features are atypical. Will also get another lipid profiles since been sometime since she had 1. Plan to see her back to discuss those results in the next few weeks.  Thanks for the kind referral.  Pixie Casino, MD, [facility] Attending Cardiologist [place] 05/06/2015, 5:13 PM

## 2015-05-06 NOTE — Patient Instructions (Signed)
Your physician has requested that you have a lexiscan myoview. For further information please visit [email or web address]. Please follow instruction sheet, as given.  Your physician has recommended that you have a sleep study @ [facility]. This test records several body functions during sleep, including: brain activity, eye movement, oxygen and carbon dioxide blood levels, heart rate and rhythm, breathing rate and rhythm, the flow of air through your mouth and nose, snoring, body muscle movements, and chest and belly movement.  Your physician recommends that you return for lab work FASTING  Your physician recommends that you schedule a follow-up appointment after your tests

## 2015-05-10 LAB — LIPID PANEL
CHOL/HDL RATIO: 3.7 ratio
Cholesterol: 170 mg/dL (ref 0–200)
HDL: 46 mg/dL (ref >=46)
LDL CALC: 87 mg/dL (ref 0–99)
TRIGLYCERIDES: 186 mg/dL — AB (ref <150)
VLDL: 37 mg/dL (ref 0–40)

## 2015-05-10 LAB — COMPREHENSIVE METABOLIC PANEL
ALK PHOS: 80 U/L (ref 39–117)
ALT: 16 U/L (ref 0–35)
AST: 14 U/L (ref 0–37)
Albumin: 3.9 g/dL (ref 3.5–5.2)
BUN: 12 mg/dL (ref 6–23)
CHLORIDE: 106 meq/L (ref 96–112)
CO2: 28 mEq/L (ref 19–32)
CREATININE: 0.78 mg/dL (ref 0.50–1.10)
Calcium: 9.5 mg/dL (ref 8.4–10.5)
Glucose, Bld: 101 mg/dL — ABNORMAL HIGH (ref 70–99)
Potassium: 5 mEq/L (ref 3.5–5.3)
Sodium: 140 mEq/L (ref 135–145)
TOTAL PROTEIN: 6.9 g/dL (ref 6.0–8.3)
Total Bilirubin: 0.5 mg/dL (ref 0.2–1.2)

## 2015-05-15 ENCOUNTER — Ambulatory Visit (INDEPENDENT_AMBULATORY_CARE_PROVIDER_SITE_OTHER): Payer: 59 | Admitting: Neurology

## 2015-05-15 ENCOUNTER — Encounter: Payer: Self-pay | Admitting: Neurology

## 2015-05-15 VITALS — BP 154/101 | HR 103 | Ht 66.0 in | Wt 276.2 lb

## 2015-05-15 DIAGNOSIS — R519 Headache, unspecified: Secondary | ICD-10-CM

## 2015-05-15 DIAGNOSIS — R51 Headache: Secondary | ICD-10-CM | POA: Diagnosis not present

## 2015-05-15 DIAGNOSIS — M5481 Occipital neuralgia: Secondary | ICD-10-CM

## 2015-05-15 MED ORDER — GABAPENTIN 100 MG PO CAPS: ORAL_CAPSULE | ORAL | Status: DC

## 2015-05-15 MED ORDER — BUPIVACAINE HCL (PF) 0.5 % IJ SOLN
1.5000 mL | Freq: Once | INTRAMUSCULAR | Status: AC
  Administered 2015-05-15: 1.5 mL

## 2015-05-15 MED ORDER — BETAMETHASONE SOD PHOS & ACET 6 (3-3) MG/ML IJ SUSP
9.0000 mg | Freq: Once | INTRAMUSCULAR | Status: AC
  Administered 2015-05-15: 9 mg via INTRAMUSCULAR

## 2015-05-15 NOTE — Progress Notes (Signed)
Reason for visit: Headache  Referring physician: Dr. Nilda Simmer  Sophia Kelley is a 64 y.o. female  History of present illness:  Sophia Kelley is a 64 year old right-handed white female with a history of onset of a right occipital headache that began about 2 months ago. Initially, the headache would come on only with sneezing or coughing, but over the last several weeks, the headache as been very persistent with a constant achy pain, with sharp jabs of electric shock of pain that comes on with coughing or sneezing. The patient finds that she is unable to sleep on her back or on her right side, she has to sleep on her left side to reduce the pain. She has been taking ibuprofen without complete benefit. She does have some associated dizziness, but she denies any photophobia or phonophobia with headache. She denies any nausea or vomiting. She denies any significant gait disturbance, or problems controlling the bowels or the bladder. She denies any weakness or numbness of the extremities. She indicates that her neck is stiff, and her mobility of the neck has been reduced, particularly when looking to the left. She has undergone a CT scan of brain that was unremarkable. She is sent to this office for an evaluation of the headache.  Past Medical History  Diagnosis Date  . Hyperlipidemia   . Hx of adenomatous colonic polyps   . IBS (irritable bowel syndrome)   . Hemorrhoids   . Colonic inertia   . Active smoker   . OSA (obstructive sleep apnea)     Suspected  . Obesity   . Allergic rhinitis     RAST positive, IgE 1569 from 01/23/2011  . Dyspnea     PFT 02/03/11>>FEV1 2.64(107%, FEV1% 76, TLC 5.44 (103%, DLCO 67%, no BD  . Hypothyroidism   . Vitamin B12 deficiency   . PVD (peripheral vascular disease)   . Hemorrhoids   . Lumbar radiculopathy   . Colonic inertia   . Arthritis   . COPD (chronic obstructive pulmonary disease)   . Osteoporosis   . Nasal congestion   . Trouble swallowing   .  Cough   . Rash   . Constipation   . Visual disturbance   . Hernia     lower back  . Vitamin D deficiency   . Chronic constipation 02/01/2014  . Fatty liver   . Sleep apnea     no cpap  . Allergy   . Costochondritis   . DDD (degenerative disc disease), lumbar   . Impacted cerumen of both ears   . Chronic back pain   . Plantar fasciitis   . Referred otalgia of right ear   . Hearing loss   . Tinea cruris   . Occipital neuralgia of right side 05/15/2015    Past Surgical History  Procedure Laterality Date  . Cesarean section  1980, 1988    x 2  . Nasal septum surgery  1984  . Abdominal hysterectomy  1990  . Laparoscopic appendectomy N/A 01/31/2014    Procedure: APPENDECTOMY LAPAROSCOPIC;  Surgeon: Harl Bowie, MD;  Location: [facility];  Service: General;  Laterality: N/A;    Family History  Problem Relation Age of Onset  . Heart attack Father 48    x3  . Allergies Father   . Arthritis Father   . Heart disease Father   . Prostate cancer Brother   . Colon cancer Neg Hx   . Hypertension Mother   . Arthritis  Mother   . Osteoporosis Mother   . Thyroid disease Mother     Social history:  reports that she has been smoking Cigarettes.  She has a 11.1 pack-year smoking history. She has never used smokeless tobacco. She reports that she does not drink alcohol or use illicit drugs.  Medications:  Prior to Admission medications   Medication Sig Start Date End Date Taking? Authorizing Provider  acetaminophen (TYLENOL) 325 MG tablet Take 2 tablets (650 mg total) by mouth every 6 (six) hours as needed for mild pain, moderate pain, fever or headache (Do not take more than 4000 mg per day at home.  it is in your prescription medicine.). 02/01/14  Yes Earnstine Regal, PA-C  aspirin 81 MG EC tablet Take 81 mg by mouth daily.    Yes Historical Provider, MD  Cholecalciferol (VITAMIN D-3) 1000 UNITS CAPS Take 2,000 Units by mouth daily.   Yes Historical Provider, MD  fluticasone  (FLONASE) 50 MCG/ACT nasal spray Place 2 sprays into both nostrils daily.   Yes Historical Provider, MD  ibuprofen (ADVIL,MOTRIN) 200 MG tablet You can take 2-3 tablets every 8 hours for pain. 02/01/14  Yes Earnstine Regal, PA-C  KRILL OIL PO Take 1 capsule by mouth daily.   Yes Historical Provider, MD  levothyroxine (SYNTHROID, LEVOTHROID) 137 MCG tablet Take 1 tablet by mouth daily.   Yes Historical Provider, MD  Linaclotide (LINZESS) 290 MCG CAPS capsule Take 1 capsule (290 mcg total) by mouth daily. 04/16/15  Yes Lafayette Dragon, MD  Multiple Vitamins-Minerals (CENTRUM ADULTS PO) Take by mouth daily.   Yes Historical Provider, MD  vitamin B-12 (CYANOCOBALAMIN) 1000 MCG tablet Take 1,000 mcg by mouth daily.   Yes Historical Provider, MD  Vitamin D, Ergocalciferol, (DRISDOL) 50000 UNITS CAPS capsule Take 50,000 Units by mouth 2 (two) times a week.   Yes Historical Provider, MD      Allergies  Allergen Reactions  . Codeine Rash    ROS:  Out of a complete 14 system review of symptoms, the patient complains only of the following symptoms, and all other reviewed systems are negative.  Fatigue Swelling in the legs Dizziness Blurred vision Shortness of breath Constipation Allergies, skin sensitivity Headache Not enough sleep, decreased energy Restless legs  Blood pressure 154/101, pulse 103, height 5\' 6"  (1.676 m), weight 276 lb 3.2 oz (125.283 kg).  Physical Exam  General: The patient is alert and cooperative at the time of the examination. The patient is markedly obese.  Eyes: Pupils are equal, round, and reactive to light. Discs are flat bilaterally.  Neck: The neck is supple, no carotid bruits are noted.  Respiratory: The respiratory examination is clear.  Cardiovascular: The cardiovascular examination reveals a regular rate and rhythm, no obvious murmurs or rubs are noted.  Skin: Extremities are without significant edema.  Neurologic Exam  Mental status: The patient is  alert and oriented x 3 at the time of the examination. The patient has apparent normal recent and remote memory, with an apparently normal attention span and concentration ability.  Cranial nerves: Facial symmetry is present. There is good sensation of the face to pinprick and soft touch bilaterally. The strength of the facial muscles and the muscles to head turning and shoulder shrug are normal bilaterally. Speech is well enunciated, no aphasia or dysarthria is noted. Extraocular movements are full. Visual fields are full. The tongue is midline, and the patient has symmetric elevation of the soft palate. No obvious hearing deficits are noted.  Motor: The motor testing reveals 5 over 5 strength of all 4 extremities. Good symmetric motor tone is noted throughout.  Sensory: Sensory testing is intact to pinprick, soft touch, vibration sensation, and position sense on all 4 extremities, with the exception that there is some decrease in position sense on both feet. No evidence of extinction is noted.  Coordination: Cerebellar testing reveals good finger-nose-finger and heel-to-shin bilaterally.  Gait and station: Gait is normal. Tandem gait is unsteady. Romberg is negative. No drift is seen.  Reflexes: Deep tendon reflexes are symmetric and normal bilaterally. Toes are downgoing bilaterally.   Assessment/Plan:  1. Headache, right occipital neuralgia  The patient appears to have limited mobility of the neck associated with pain in the right occipital nerve distribution associated with coughing, with an electric shock sensation. The patient has a more constant dull achy sensation otherwise. The patient will undergo occipital nerve injection today, she will be placed on gabapentin, blood work will be done for a sedimentation rate. The occipital nerve injections may be repeated if they are helpful.  Jill Alexanders MD 05/15/2015 7:05 PM  [facility] [address] [place], [place] [address]  Phone [phone] Fax [phone]

## 2015-05-15 NOTE — Procedures (Signed)
   History: Sophia Kelley is a 64 year old patient with a two-month history of sharp jabbing pains in the right occipital area consistent with occipital neuralgia. The symptoms have greatly worsened within the last several weeks. Occipital nerve injection will be done to help improve the neuralgia pain.    Bupivicaine/betamethasone injection protocol for occipital neuralgia:  Bupivacaine 0.5%, total of 1.5 mL and betamethasone 30 mg per 5 mL concentration, 1.5 mL total, was injected on the scalp on the right at several locations:  -On the occipital area of the head, 3 injections on the right side, 1 cc per injection at the midpoint between the mastoid process and the occipital protuberance. 2 other injections were done one finger breadth from the initial injection, one at a 10 o'clock position and the other at a 2 o'clock position.  The patient tolerated the injections well, no complications of the procedure were noted. Injections were made with a 27-gauge needle.  [place] number for bupivacaine 0.5% is [phone].  Lot number is [id number]. Expriation date is 03/14/16.   [place] number for betamethasone at 30 mg per 5 mL concentration is 0517-072 0-0 1  Lot number is [id number]. Expiration date is July 2017.

## 2015-05-15 NOTE — Patient Instructions (Signed)
Occipital Neuralgia Occipital neuralgia is a type of headache that causes episodes of very bad pain in the back of your head. Pain from occipital neuralgia may spread (radiate) to other parts of your head. The pain is usually brief and often goes away after you rest and relax. These headaches may be caused by irritation of the nerves that leave your spinal cord high up in your neck, just below the base of your skull (occipital nerves). Your occipital nerves transmit sensations from the back of your head, the top of your head, and the areas behind your ears. CAUSES Occipital neuralgia can occur without any known cause (primary headache syndrome). In other cases, occipital neuralgia is caused by pressure on or irritation of one of the two occipital nerves. Causes of occipital nerve compression or irritation include:  Wear and tear of the vertebrae in the neck (osteoarthritis).  Neck injury.  Disease of the disks that separate the vertebrae.  Tumors.  Gout.  Infections.  Diabetes.  Swollen blood vessels that put pressure on the occipital nerves.  Muscle spasm in the neck. SIGNS AND SYMPTOMS Pain is the main symptom of occipital neuralgia. It usually starts in the back of the head but may also be felt in other areas supplied by the occipital nerves. Pain is usually on one side but may be on both sides. You may have:   Brief episodes of very bad pain that is burning, stabbing, shocking, or shooting.  Pain behind the eye.  Pain triggered by neck movement or hair brushing.  Scalp tenderness.  Aching in the back of the head between episodes of very bad pain. DIAGNOSIS  Your health care provider may diagnose occipital neuralgia based on your symptoms and a physical exam. During the exam, the health care provider may push on areas supplied by the occipital nerves to see if they are painful. Some tests may also be done to help in making the diagnosis. These may include:  Imaging studies of  the upper spinal cord, such as an MRI or CT scan. These may show compression or spinal cord abnormalities.  Nerve block. You will get an injection of numbing medicine (local anesthetic) near the occipital nerve to see if this relieves pain. TREATMENT  Treatment may begin with simple measures, such as:   Rest.  Massage.  Heat.  Over-the-counter pain relievers. If these measures do not work, you may need other treatments, including:  Medicines such as:  Prescription-strength anti-inflammatory medicines.  Muscle relaxants.  Antiseizure medicines.  Antidepressants.  Steroid injection. This involves injections of local anesthetic and strong anti-inflammatory drugs (steroids).  Pulsed radiofrequency. Wires are implanted to deliver electrical impulses that block pain signals from the occipital nerve.  Physical therapy.  Surgery to relieve nerve pressure. HOME CARE INSTRUCTIONS  Take all medicines as directed by your health care provider.  Avoid activities that cause pain.  Rest when you have an attack of pain.  Try gentle massage or a heating pad to relieve pain.  Work with a physical therapist to learn stretching exercises you can do at home.  Try a different pillow or sleeping position.  Practice good posture.  Try to stay active. Get regular exercise that does not cause pain. Ask your health care provider to suggest safe exercises for you.  Keep all follow-up visits as directed by your health care provider. This is important. SEEK MEDICAL CARE IF:  Your medicine is not working.  You have new or worsening symptoms. SEEK IMMEDIATE MEDICAL CARE   IF:  You have very bad head pain that is not going away.  You have a sudden change in vision, balance, or speech. MAKE SURE YOU:  Understand these instructions.  Will watch your condition.  Will get help right away if you are not doing well or get worse. Document Released: 11/24/2001 Document Revised: 04/16/2014  Document Reviewed: 11/22/2013 ExitCare Patient Information 2015 [employer]. This information is not intended to replace advice given to you by your health care provider. Make sure you discuss any questions you have with your health care provider.  

## 2015-05-16 LAB — SEDIMENTATION RATE: SED RATE: 20 mm/h (ref 0–40)

## 2015-05-17 ENCOUNTER — Telehealth: Payer: Self-pay

## 2015-05-17 NOTE — Telephone Encounter (Signed)
I called the patient and relayed results. 

## 2015-05-17 NOTE — Telephone Encounter (Signed)
-----   Message from Kathrynn Ducking, MD sent at 05/16/2015  9:19 AM EDT -----  The blood work results are unremarkable. Please call the patient.   ----- Message -----    From: [facility] Lab Results In Interface    Sent: 05/16/2015   5:40 AM      To: Kathrynn Ducking, MD

## 2015-05-22 ENCOUNTER — Inpatient Hospital Stay (HOSPITAL_COMMUNITY): Admission: RE | Admit: 2015-05-22 | Payer: 59 | Source: Ambulatory Visit

## 2015-05-23 ENCOUNTER — Ambulatory Visit (HOSPITAL_COMMUNITY): Payer: 59

## 2015-06-03 ENCOUNTER — Telehealth: Payer: Self-pay | Admitting: Neurology

## 2015-06-03 NOTE — Telephone Encounter (Signed)
I called the patient and informed her that per Dr. Krista Blue, it is okay to take Gabapentin 100 mg 3 tablets 3 times daily. The patient verbalized understanding.

## 2015-06-03 NOTE — Telephone Encounter (Signed)
I called the patient. She stated that she has been having terrible pain today. She stated that Dr. Jannifer Franklin mentioned that he could increase her Gabapentin if it is not helping. She would like to know what she can increase this to? Please advise.

## 2015-06-03 NOTE — Telephone Encounter (Signed)
She is now taking gabapentin 100 mg 2 tablets 3 times a day,  She may increase to 3 tablets 3 times a day, (maximum dose of gabapentin is 3600 mg daily), try higher dosage for 2-3 weeks, if she continue have headaches, may call back office

## 2015-06-03 NOTE — Telephone Encounter (Signed)
Patients husband called and stated that they had previously discussed with Dr. Jannifer Franklin the option of increasing the patients Rx. GABAPENTIN. Her headaches are becoming worse and they would like to do so. Please call and advise.

## 2015-06-07 ENCOUNTER — Telehealth: Payer: Self-pay | Admitting: Neurology

## 2015-06-07 MED ORDER — GABAPENTIN 300 MG PO CAPS: 300.0000 mg | ORAL_CAPSULE | Freq: Three times a day (TID) | ORAL | Status: DC

## 2015-06-07 MED ORDER — PREDNISONE 10 MG PO TABS: ORAL_TABLET | ORAL | Status: DC

## 2015-06-07 NOTE — Telephone Encounter (Signed)
Patient called and requested to be seen next week for head pain. She states that her pain is worsening and running down her neck. Please call and advise.

## 2015-06-07 NOTE — Telephone Encounter (Signed)
I called patient. The occipital neuralgia is worsening, the patient now has pain down into the right neck. I will give her a prednisone Dosepak, increase gabapentin taking 300 mg 3 times daily. If the patient's pain does not abate, MRI scan of the cervical spine may need to be done.

## 2015-06-07 NOTE — Telephone Encounter (Signed)
I called the patient. She stated the pain is back on the right side of her head. She stated it is now going down her neck and it wasn't before. She would like to come back in to see Dr. Jannifer Franklin. His last note mentions possibly doing another nerve block. She stated this helped for a few days. I told her I would see what he recommends before making an appointment, so I can make sure I get her scheduled for the right type of appointment. She was happy with this plan.

## 2015-06-26 ENCOUNTER — Ambulatory Visit: Payer: 59 | Admitting: Internal Medicine

## 2015-07-10 ENCOUNTER — Ambulatory Visit (HOSPITAL_BASED_OUTPATIENT_CLINIC_OR_DEPARTMENT_OTHER): Payer: 59

## 2015-07-14 ENCOUNTER — Encounter (HOSPITAL_BASED_OUTPATIENT_CLINIC_OR_DEPARTMENT_OTHER): Payer: 59

## 2015-08-06 ENCOUNTER — Telehealth: Payer: Self-pay | Admitting: Neurology

## 2015-08-12 ENCOUNTER — Ambulatory Visit (INDEPENDENT_AMBULATORY_CARE_PROVIDER_SITE_OTHER): Payer: 59 | Admitting: Neurology

## 2015-08-12 ENCOUNTER — Encounter: Payer: Self-pay | Admitting: Neurology

## 2015-08-12 VITALS — BP 143/93 | HR 87 | Ht 66.0 in | Wt 275.0 lb

## 2015-08-12 DIAGNOSIS — M5481 Occipital neuralgia: Secondary | ICD-10-CM | POA: Diagnosis not present

## 2015-08-12 MED ORDER — GABAPENTIN 600 MG PO TABS: 600.0000 mg | ORAL_TABLET | Freq: Three times a day (TID) | ORAL | Status: DC

## 2015-08-12 NOTE — Patient Instructions (Addendum)
We will check MRI evaluation of the brain. We will increase the gabapentin taking 600 mg tablets, begin taking one half tablet twice during the day, one full tablet at night for one week, then take 1 full tablet in the morning and in the evening, one half tablet at midday for one week, then take 1 full tablet 3 times daily.   Occipital Neuralgia Occipital neuralgia is a type of headache that causes episodes of very bad pain in the back of your head. Pain from occipital neuralgia may spread (radiate) to other parts of your head. The pain is usually brief and often goes away after you rest and relax. These headaches may be caused by irritation of the nerves that leave your spinal cord high up in your neck, just below the base of your skull (occipital nerves). Your occipital nerves transmit sensations from the back of your head, the top of your head, and the areas behind your ears. CAUSES Occipital neuralgia can occur without any known cause (primary headache syndrome). In other cases, occipital neuralgia is caused by pressure on or irritation of one of the two occipital nerves. Causes of occipital nerve compression or irritation include:  Wear and tear of the vertebrae in the neck (osteoarthritis).  Neck injury.  Disease of the disks that separate the vertebrae.  Tumors.  Gout.  Infections.  Diabetes.  Swollen blood vessels that put pressure on the occipital nerves.  Muscle spasm in the neck. SIGNS AND SYMPTOMS Pain is the main symptom of occipital neuralgia. It usually starts in the back of the head but may also be felt in other areas supplied by the occipital nerves. Pain is usually on one side but may be on both sides. You may have:   Brief episodes of very bad pain that is burning, stabbing, shocking, or shooting.  Pain behind the eye.  Pain triggered by neck movement or hair brushing.  Scalp tenderness.  Aching in the back of the head between episodes of very bad  pain. DIAGNOSIS  Your health care provider may diagnose occipital neuralgia based on your symptoms and a physical exam. During the exam, the health care provider may push on areas supplied by the occipital nerves to see if they are painful. Some tests may also be done to help in making the diagnosis. These may include:  Imaging studies of the upper spinal cord, such as an MRI or CT scan. These may show compression or spinal cord abnormalities.  Nerve block. You will get an injection of numbing medicine (local anesthetic) near the occipital nerve to see if this relieves pain. TREATMENT  Treatment may begin with simple measures, such as:   Rest.  Massage.  Heat.  Over-the-counter pain relievers. If these measures do not work, you may need other treatments, including:  Medicines such as:  Prescription-strength anti-inflammatory medicines.  Muscle relaxants.  Antiseizure medicines.  Antidepressants.  Steroid injection. This involves injections of local anesthetic and strong anti-inflammatory drugs (steroids).  Pulsed radiofrequency. Wires are implanted to deliver electrical impulses that block pain signals from the occipital nerve.  Physical therapy.  Surgery to relieve nerve pressure. HOME CARE INSTRUCTIONS  Take all medicines as directed by your health care provider.  Avoid activities that cause pain.  Rest when you have an attack of pain.  Try gentle massage or a heating pad to relieve pain.  Work with a physical therapist to learn stretching exercises you can do at home.  Try a different pillow or sleeping  position.  Practice good posture.  Try to stay active. Get regular exercise that does not cause pain. Ask your health care provider to suggest safe exercises for you.  Keep all follow-up visits as directed by your health care provider. This is important. SEEK MEDICAL CARE IF:  Your medicine is not working.  You have new or worsening symptoms. SEEK  IMMEDIATE MEDICAL CARE IF:  You have very bad head pain that is not going away.  You have a sudden change in vision, balance, or speech. MAKE SURE YOU:  Understand these instructions.  Will watch your condition.  Will get help right away if you are not doing well or get worse. Document Released: 11/24/2001 Document Revised: 04/16/2014 Document Reviewed: 11/22/2013 [facility] Patient Information 2015 [place], [place]. This information is not intended to replace advice given to you by your health care provider. Make sure you discuss any questions you have with your health care provider.

## 2015-08-12 NOTE — Progress Notes (Signed)
Reason for visit: Right occipital neuralgia  Sophia Kelley is an 64 y.o. female  History of present illness:  Sophia Kelley is a 65 year old right-handed white female with a history of right occipital discomfort that is mainly behind the right ear. The patient indicates that when she coughs or sneezes, there is a sharp jabbing pain in this area. Otherwise, there is a dull achy pain. She had an occipital nerve injection, but this only helped for one day. She has been placed on low-dose gabapentin, she seems to get benefit transiently with each dose increase, but the pain control wears off on her. She currently is on 300 mg 3 times daily, otherwise she is tolerating the medication well. She has some restriction of neck movement at times. She denies any pain down the arms, she denies any significant changes in balance, but she feels a bit staggery at times. She has had some slight swelling on the gabapentin.  Past Medical History  Diagnosis Date  . Hyperlipidemia   . Hx of adenomatous colonic polyps   . IBS (irritable bowel syndrome)   . Hemorrhoids   . Colonic inertia   . Active smoker   . OSA (obstructive sleep apnea)     Suspected  . Obesity   . Allergic rhinitis     RAST positive, IgE 1569 from 01/23/2011  . Dyspnea     PFT 02/03/11>>FEV1 2.64(107%, FEV1% 76, TLC 5.44 (103%, DLCO 67%, no BD  . Hypothyroidism   . Vitamin B12 deficiency   . PVD (peripheral vascular disease)   . Hemorrhoids   . Lumbar radiculopathy   . Colonic inertia   . Arthritis   . COPD (chronic obstructive pulmonary disease)   . Osteoporosis   . Nasal congestion   . Trouble swallowing   . Cough   . Rash   . Constipation   . Visual disturbance   . Hernia     lower back  . Vitamin D deficiency   . Chronic constipation 02/01/2014  . Fatty liver   . Sleep apnea     no cpap  . Allergy   . Costochondritis   . DDD (degenerative disc disease), lumbar   . Impacted cerumen of both ears   . Chronic back  pain   . Plantar fasciitis   . Referred otalgia of right ear   . Hearing loss   . Tinea cruris   . Occipital neuralgia of right side 05/15/2015    Past Surgical History  Procedure Laterality Date  . Cesarean section  1980, 1988    x 2  . Nasal septum surgery  1984  . Abdominal hysterectomy  1990  . Laparoscopic appendectomy N/A 01/31/2014    Procedure: APPENDECTOMY LAPAROSCOPIC;  Surgeon: Harl Bowie, MD;  Location: [facility];  Service: General;  Laterality: N/A;    Family History  Problem Relation Age of Onset  . Heart attack Father 48    x3  . Allergies Father   . Arthritis Father   . Heart disease Father   . Prostate cancer Brother   . Colon cancer Neg Hx   . Hypertension Mother   . Arthritis Mother   . Osteoporosis Mother   . Thyroid disease Mother     Social history:  reports that she has been smoking Cigarettes.  She has a 11.1 pack-year smoking history. She has never used smokeless tobacco. She reports that she does not drink alcohol or use illicit drugs.    Allergies  Allergen Reactions  . Codeine Rash    Medications:  Prior to Admission medications   Medication Sig Start Date End Date Taking? Authorizing Provider  acetaminophen (TYLENOL) 325 MG tablet Take 2 tablets (650 mg total) by mouth every 6 (six) hours as needed for mild pain, moderate pain, fever or headache (Do not take more than 4000 mg per day at home.  it is in your prescription medicine.). 02/01/14   Earnstine Regal, PA-C  aspirin 81 MG EC tablet Take 81 mg by mouth daily.     Historical Provider, MD  Cholecalciferol (VITAMIN D-3) 1000 UNITS CAPS Take 2,000 Units by mouth daily.    Historical Provider, MD  fluticasone (FLONASE) 50 MCG/ACT nasal spray Place 2 sprays into both nostrils daily.    Historical Provider, MD  gabapentin (NEURONTIN) 300 MG capsule Take 1 capsule (300 mg total) by mouth 3 (three) times daily. 06/07/15   Kathrynn Ducking, MD  ibuprofen (ADVIL,MOTRIN) 200 MG tablet You can  take 2-3 tablets every 8 hours for pain. 02/01/14   Earnstine Regal, PA-C  KRILL OIL PO Take 1 capsule by mouth daily.    Historical Provider, MD  levothyroxine (SYNTHROID, LEVOTHROID) 137 MCG tablet Take 1 tablet by mouth daily.    Historical Provider, MD  Linaclotide Rolan Lipa) 290 MCG CAPS capsule Take 1 capsule (290 mcg total) by mouth daily. 04/16/15   Lafayette Dragon, MD  Multiple Vitamins-Minerals (CENTRUM ADULTS PO) Take by mouth daily.    Historical Provider, MD  predniSONE (DELTASONE) 10 MG tablet Begin taking 6 tablets daily, taper by one tablet every other day until off the medication. 06/07/15   Kathrynn Ducking, MD  vitamin B-12 (CYANOCOBALAMIN) 1000 MCG tablet Take 1,000 mcg by mouth daily.    Historical Provider, MD  Vitamin D, Ergocalciferol, (DRISDOL) 50000 UNITS CAPS capsule Take 50,000 Units by mouth 2 (two) times a week.    Historical Provider, MD    ROS:  Out of a complete 14 system review of symptoms, the patient complains only of the following symptoms, and all other reviewed systems are negative.  Headache Swelling  Blood pressure 143/93, pulse 87, height 5\' 6"  (1.676 m), weight 275 lb (124.739 kg).  Physical Exam  General: The patient is alert and cooperative at the time of the examination. The patient is moderately obese.  Neuromuscular: The patient lacks about 15-20 of full lateral rotation to the left, 10 to the right.  Skin: No significant peripheral edema is noted.   Neurologic Exam  Mental status: The patient is alert and oriented x 3 at the time of the examination. The patient has apparent normal recent and remote memory, with an apparently normal attention span and concentration ability.   Cranial nerves: Facial symmetry is present. Speech is normal, no aphasia or dysarthria is noted. Extraocular movements are full. Visual fields are full.  Motor: The patient has good strength in all 4 extremities.  Sensory examination: Soft touch sensation is  symmetric on the face, arms, and legs.  Coordination: The patient has good finger-nose-finger and heel-to-shin bilaterally.  Gait and station: The patient has a normal gait. Tandem gait is normal. Romberg is negative. No drift is seen.  Reflexes: Deep tendon reflexes are symmetric.   Assessment/Plan:  1. Right occipital neuralgia  The patient is having ongoing discomfort. She is having increased pain with coughing or sneezing. MRI of the brain will be done, we will increase the gabapentin gradually to 600 mg 3 times daily. She will  follow-up in 3-4 months. She only got one day of benefit from the occipital nerve injection.  Jill Alexanders MD 08/12/2015 2:50 PM  [facility] [address] [place], [place] [address]  Phone [phone] Fax [phone]

## 2015-08-15 NOTE — Telephone Encounter (Signed)
error 

## 2015-08-25 ENCOUNTER — Ambulatory Visit
Admission: RE | Admit: 2015-08-25 | Discharge: 2015-08-25 | Disposition: A | Discharge status: Home / Self Care | Payer: 59 | Source: Ambulatory Visit | Attending: Neurology | Admitting: Neurology

## 2015-08-25 DIAGNOSIS — M5481 Occipital neuralgia: Secondary | ICD-10-CM

## 2015-08-26 ENCOUNTER — Telehealth: Payer: Self-pay | Admitting: Neurology

## 2015-08-26 NOTE — Telephone Encounter (Signed)
Called patient. The MRI the brain does show some mild small vessel disease. The patient still having some occipital nerve pain, we could add carbamazepine to the regimen in the future. The patient will contact me if needed.   MRI brain 08/26/2015:  IMPRESSION: This MRI of the head without contrast shows the following: 1. Mild number of T2/FLAIR hyperintense foci in the hemispheres consistent with mild chronic microvascular ischemic change. None of these changes appears to be acute. 2. Mild chronic frontal sinusitis and a small amount of fluid in the left maxillary sinus.

## 2015-09-06 ENCOUNTER — Ambulatory Visit: Payer: 59 | Admitting: Neurology

## 2015-10-09 DIAGNOSIS — F17200 Nicotine dependence, unspecified, uncomplicated: Secondary | ICD-10-CM | POA: Insufficient documentation

## 2015-10-09 DIAGNOSIS — J441 Chronic obstructive pulmonary disease with (acute) exacerbation: Secondary | ICD-10-CM | POA: Insufficient documentation

## 2015-10-17 ENCOUNTER — Encounter: Payer: Self-pay | Admitting: Cardiology

## 2015-10-17 ENCOUNTER — Ambulatory Visit (INDEPENDENT_AMBULATORY_CARE_PROVIDER_SITE_OTHER): Payer: 59 | Admitting: Cardiology

## 2015-10-17 ENCOUNTER — Encounter: Payer: Self-pay | Admitting: *Deleted

## 2015-10-17 VITALS — BP 128/86 | HR 93 | Ht 66.0 in | Wt 287.8 lb

## 2015-10-17 DIAGNOSIS — R079 Chest pain, unspecified: Secondary | ICD-10-CM | POA: Diagnosis not present

## 2015-10-17 DIAGNOSIS — G4730 Sleep apnea, unspecified: Secondary | ICD-10-CM | POA: Diagnosis not present

## 2015-10-17 DIAGNOSIS — E039 Hypothyroidism, unspecified: Secondary | ICD-10-CM

## 2015-10-17 DIAGNOSIS — I209 Angina pectoris, unspecified: Secondary | ICD-10-CM

## 2015-10-17 DIAGNOSIS — R0602 Shortness of breath: Secondary | ICD-10-CM | POA: Diagnosis not present

## 2015-10-17 DIAGNOSIS — F17200 Nicotine dependence, unspecified, uncomplicated: Secondary | ICD-10-CM

## 2015-10-17 LAB — BASIC METABOLIC PANEL
BUN: 10 mg/dL (ref 7–25)
CALCIUM: 9.5 mg/dL (ref 8.6–10.4)
CO2: 25 mmol/L (ref 20–31)
Chloride: 103 mmol/L (ref 98–110)
Creat: 0.8 mg/dL (ref 0.50–0.99)
GLUCOSE: 94 mg/dL (ref 65–99)
POTASSIUM: 4.5 mmol/L (ref 3.5–5.3)
SODIUM: 138 mmol/L (ref 135–146)

## 2015-10-17 LAB — CBC WITH DIFFERENTIAL/PLATELET
Basophils Absolute: 0 10*3/uL (ref 0.0–0.1)
Basophils Relative: 0 % (ref 0–1)
Eosinophils Absolute: 0.3 10*3/uL (ref 0.0–0.7)
Eosinophils Relative: 3 % (ref 0–5)
HEMATOCRIT: 46.1 % — AB (ref 36.0–46.0)
HEMOGLOBIN: 15.4 g/dL — AB (ref 12.0–15.0)
LYMPHS PCT: 30 % (ref 12–46)
Lymphs Abs: 2.6 10*3/uL (ref 0.7–4.0)
MCH: 28.6 pg (ref 26.0–34.0)
MCHC: 33.4 g/dL (ref 30.0–36.0)
MCV: 85.7 fL (ref 78.0–100.0)
MONO ABS: 0.8 10*3/uL (ref 0.1–1.0)
MONOS PCT: 9 % (ref 3–12)
MPV: 9.3 fL (ref 8.6–12.4)
NEUTROS ABS: 5 10*3/uL (ref 1.7–7.7)
Neutrophils Relative %: 58 % (ref 43–77)
Platelets: 200 10*3/uL (ref 150–400)
RBC: 5.38 MIL/uL — ABNORMAL HIGH (ref 3.87–5.11)
RDW: 14.2 % (ref 11.5–15.5)
WBC: 8.6 10*3/uL (ref 4.0–10.5)

## 2015-10-17 MED ORDER — NITROGLYCERIN 0.4 MG SL SUBL: 0.4000 mg | SUBLINGUAL_TABLET | SUBLINGUAL | Status: DC | PRN

## 2015-10-17 NOTE — Patient Instructions (Signed)
Medication Instructions:  Use nitroglycerin as needed for chest pain.    Labwork: BMET/CBCd/PT/INR today  Testing/Procedures: Your physician has requested that you have a cardiac catheterization. Cardiac catheterization is used to diagnose and/or treat various heart conditions. Doctors may recommend this procedure for a number of different reasons. The most common reason is to evaluate chest pain. Chest pain can be a symptom of coronary artery disease (CAD), and cardiac catheterization can show whether plaque is narrowing or blocking your heart's arteries. This procedure is also used to evaluate the valves, as well as measure the blood flow and oxygen levels in different parts of your heart. For further information please visit [email or web address]. Please follow instruction sheet, as given. Wednesday November 9,2016  Your physician has recommended that you have a sleep study. This test records several body functions during sleep, including: brain activity, eye movement, oxygen and carbon dioxide blood levels, heart rate and rhythm, breathing rate and rhythm, the flow of air through your mouth and nose, snoring, body muscle movements, and chest and belly movement.      Follow-Up: Your physician recommends that you schedule a follow-up appointment in: 3-4 weeks with Dr Aundra Dubin.    Any Other Special Instructions Will Be Listed Below (If Applicable).  You have been referred to Dr Cruzita Lederer for evaluation of your thyroid.      If you need a refill on your cardiac medications before your next appointment, please call your pharmacy.

## 2015-10-18 LAB — PROTIME-INR
INR: 0.94 (ref <1.50)
Prothrombin Time: 12.7 seconds (ref 11.6–15.2)

## 2015-10-19 DIAGNOSIS — I209 Angina pectoris, unspecified: Secondary | ICD-10-CM | POA: Insufficient documentation

## 2015-10-19 NOTE — Progress Notes (Signed)
Patient ID: Sophia Kelley, female   DOB: Jun 30, 1951, 64 y.o.   MRN: [id number] PCP: Dr Nilda Simmer  64 yo with history of OSA, obesity, and COPD presents for cardiology followup.  She has had 2 months of dyspnea and central chest heaviness with exertion.  This tends to happen a couple of times a day.  Symptoms occur after walking 100-200 feet of walking and resolve with rest.  They have gradually worsened.  She continues to smoke.  She was recently admitted to the hospital in [facility] with chest pain and dyspnea.  She was ruled out for MI and had CTA chest showing no PE or PNA.  She was told that she needed to see a cardiologist after discharge.  She was also noted to have abnormal thyroid labs with low TSH, elevated free T4 and low free T3.  She carries a prior history of hypothyroidism but has not been on medication for this.    Labs (5/16): K 5, creatinine 0.78, LDL 87, HDL 46  ECG: NSR, normal  Past Medical History: 1. HYPERLIPIDEMIA  2. COLONIC POLYPS, ADENOMATOUS 3. IRRITABLE BOWEL SYNDROME  4. HEMORRHOIDS  5. COLONIC INERTIA 6. COPD: Active smoker 7. OSA: Severe.  Not currently using CPAP. 8. Obesity 9. Allergic rhinitis - RAST positive, IgE 1569 from Jan 23, 2011 10. Dyspnea - PFTs 02/03/11>>FEV1 2.64(107%), FEV1% 76, TLC 5.44(103%), DLCO 67%, no BD - Echo (1/12): EF 55-60%, mild LVH, grade I diastolic dysfunction, ? 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  11. Hypothyroidism 12. Occipital neuralgia.   Family History: Father - MI age 35 Brother - Prostate cancer Multiple relatives on father's side with early CAD.   Social History: Married. Unemployed. Started smoking age 64, and currently smokes 1/2 pack per day. No significant alcohol use. Lives in [place].   ROS: All systems reviewed and negative except as per HPI.   Current Outpatient Prescriptions  Medication Sig Dispense Refill  . acetaminophen  (TYLENOL) 325 MG tablet Take 2 tablets (650 mg total) by mouth every 6 (six) hours as needed for mild pain, moderate pain, fever or headache (Do not take more than 4000 mg per day at home.  it is in your prescription medicine.).    Marland Kitchen albuterol (PROVENTIL HFA;VENTOLIN HFA) 108 (90 BASE) MCG/ACT inhaler Inhale into the lungs.    Marland Kitchen aspirin 81 MG EC tablet Take 81 mg by mouth daily.     . clindamycin (CLEOCIN T) 1 % external solution Apply topically.    . fluticasone (FLONASE) 50 MCG/ACT nasal spray Place 2 sprays into both nostrils daily.    Marland Kitchen ibuprofen (ADVIL,MOTRIN) 200 MG tablet You can take 2-3 tablets every 8 hours for pain. 30 tablet 0  . KRILL OIL PO Take 1 capsule by mouth daily.    . Linaclotide (LINZESS) 290 MCG CAPS capsule Take 1 capsule (290 mcg total) by mouth daily. 30 capsule 10  . Linaclotide (LINZESS) 290 MCG CAPS capsule Take by mouth.    . Multiple Vitamins-Minerals (CENTRUM ADULTS PO) Take by mouth daily.    Marland Kitchen nystatin cream (MYCOSTATIN) Apply topically.    . vitamin B-12 (CYANOCOBALAMIN) 1000 MCG tablet Take 1,000 mcg by mouth daily.    . Vitamin D, Ergocalciferol, (DRISDOL) 50000 UNITS CAPS capsule Take by mouth.    . nitroGLYCERIN (NITROSTAT) 0.4 MG SL tablet Place 1 tablet (0.4 mg total) under the tongue every 5 (five) minutes as needed  for chest pain. 90 tablet 3   Current Facility-Administered Medications  Medication Dose Route Frequency Provider Last Rate Last Dose  . cyanocobalamin ((VITAMIN B-12)) injection 1,000 mcg  1,000 mcg Intramuscular Once Janith Lima, MD      . pneumococcal 23 valent vaccine (PNU-IMMUNE) injection 0.5 mL  0.5 mL Intramuscular Once Janith Lima, MD       BP 128/86 mmHg  Pulse 93  Ht 5\' 6"  (1.676 m)  Wt 287 lb 12.8 oz (130.545 kg)  BMI 46.47 kg/m2 General: NAD, obese. Neck: No JVD, no thyromegaly or thyroid nodule.  Lungs: Clear to auscultation bilaterally with normal respiratory effort. CV: Nondisplaced PMI.  Heart regular S1/S2, no  S3/S4, no murmur.  No peripheral edema.  No carotid bruit.  Normal pedal pulses.  Abdomen: Soft, nontender, no hepatosplenomegaly, no distention.  Skin: Intact without lesions or rashes.  Neurologic: Alert and oriented x 3.  Psych: Normal affect. Extremities: No clubbing or cyanosis.  HEENT: Normal.   Assessment/Plan: 1. Exertional chest pain/dyspnea: Symptoms are new over the last few weeks and progressively worsening.  She has chest tightness and dyspnea with exertion resolving with rest.  She is an active smoker.  She has a family history of premature CAD.  Her symptoms are concerning enough that I think she needs a cardiac catheterization.  I will arrange for left and right heart cath given her symptoms.  She will continue ASA 81 daily.  I explained the risks/benefits of catheterization to her today and she agrees to proceed.  2. OSA: Severe, needs CPAP.  I will arrange for sleep study for CPAP titration . 3. COPD: Active smoking.  I strongly urged her to quit.  If cardiac cath is unremarkable, will need PFTs.   Loralie Champagne 10/19/2015

## 2015-10-21 ENCOUNTER — Telehealth: Payer: Self-pay | Admitting: Cardiology

## 2015-10-21 NOTE — Telephone Encounter (Signed)
Pt requesting to cancel R and LHC scheduled for 10/23/15, pt states her mother is ill and she has to take care of her.  Pt states she will call back to reschedule. R and LHC cancelled for 10/23/15 with Santiago Glad.

## 2015-10-21 NOTE — Telephone Encounter (Signed)
New problem   Pt's spouse calling and stated the test that is sched for pt to have at [facility] 11.8.16 need to be canceled. Pt has conflict.

## 2015-10-21 NOTE — Telephone Encounter (Signed)
Would give her a call later in the week to see if she wants to reschedule next week perhaps?

## 2015-10-21 NOTE — Telephone Encounter (Signed)
F/u    Pt's husband returning Anne's phone call.

## 2015-10-21 NOTE — Telephone Encounter (Signed)
LMTCB for pt 

## 2015-10-23 ENCOUNTER — Ambulatory Visit (HOSPITAL_COMMUNITY): Admit: 2015-10-23 | Payer: 59 | Admitting: Cardiology

## 2015-10-23 ENCOUNTER — Encounter (HOSPITAL_COMMUNITY): Payer: Self-pay

## 2015-10-23 SURGERY — RIGHT/LEFT HEART CATH AND CORONARY ANGIOGRAPHY
Anesthesia: LOCAL

## 2015-10-25 NOTE — Telephone Encounter (Signed)
LMTCB

## 2015-10-28 NOTE — Telephone Encounter (Signed)
LMTCB

## 2015-10-28 NOTE — Telephone Encounter (Signed)
LMTCB for pt 

## 2015-10-29 NOTE — Telephone Encounter (Signed)
F/u ° ° °Pt returning your call °

## 2015-10-29 NOTE — Telephone Encounter (Signed)
Spoke with pt, R and LHC rescheduled for 11/01/15 arrive 5:30AM, cath 7:30AM.

## 2015-10-31 ENCOUNTER — Telehealth: Payer: Self-pay | Admitting: Cardiology

## 2015-10-31 NOTE — Telephone Encounter (Signed)
LMTCB

## 2015-10-31 NOTE — Telephone Encounter (Signed)
Pt will ask Dr Aundra Dubin tomorrow if she should keep appt with him 11/05/15

## 2015-10-31 NOTE — Telephone Encounter (Signed)
New message      Pt is due to to have a procedure on tomorrow.  She got a reminder call for an appt scheduled on tues, nov 22nd.  Should she keep the tues appt or is this an old appt?

## 2015-11-01 ENCOUNTER — Encounter (HOSPITAL_COMMUNITY): Payer: Self-pay | Admitting: Cardiology

## 2015-11-01 ENCOUNTER — Ambulatory Visit (HOSPITAL_COMMUNITY)
Admission: RE | Admit: 2015-11-01 | Discharge: 2015-11-01 | Disposition: A | Discharge status: Home / Self Care | Payer: 59 | Source: Ambulatory Visit | Attending: Cardiology | Admitting: Cardiology

## 2015-11-01 ENCOUNTER — Encounter (HOSPITAL_COMMUNITY)
Admission: RE | Disposition: A | Discharge status: Home / Self Care | Payer: Self-pay | Source: Ambulatory Visit | Attending: Cardiology

## 2015-11-01 DIAGNOSIS — R079 Chest pain, unspecified: Secondary | ICD-10-CM | POA: Diagnosis not present

## 2015-11-01 DIAGNOSIS — E669 Obesity, unspecified: Secondary | ICD-10-CM | POA: Diagnosis not present

## 2015-11-01 DIAGNOSIS — E785 Hyperlipidemia, unspecified: Secondary | ICD-10-CM | POA: Insufficient documentation

## 2015-11-01 DIAGNOSIS — Z8249 Family history of ischemic heart disease and other diseases of the circulatory system: Secondary | ICD-10-CM | POA: Insufficient documentation

## 2015-11-01 DIAGNOSIS — Z6841 Body Mass Index (BMI) 40.0 and over, adult: Secondary | ICD-10-CM | POA: Diagnosis not present

## 2015-11-01 DIAGNOSIS — K589 Irritable bowel syndrome without diarrhea: Secondary | ICD-10-CM | POA: Diagnosis not present

## 2015-11-01 DIAGNOSIS — F17210 Nicotine dependence, cigarettes, uncomplicated: Secondary | ICD-10-CM | POA: Insufficient documentation

## 2015-11-01 DIAGNOSIS — R0600 Dyspnea, unspecified: Secondary | ICD-10-CM | POA: Diagnosis not present

## 2015-11-01 DIAGNOSIS — J449 Chronic obstructive pulmonary disease, unspecified: Secondary | ICD-10-CM | POA: Insufficient documentation

## 2015-11-01 DIAGNOSIS — Z7982 Long term (current) use of aspirin: Secondary | ICD-10-CM | POA: Insufficient documentation

## 2015-11-01 DIAGNOSIS — G4733 Obstructive sleep apnea (adult) (pediatric): Secondary | ICD-10-CM | POA: Insufficient documentation

## 2015-11-01 DIAGNOSIS — E039 Hypothyroidism, unspecified: Secondary | ICD-10-CM | POA: Diagnosis not present

## 2015-11-01 HISTORY — PX: CARDIAC CATHETERIZATION: SHX172

## 2015-11-01 LAB — POCT I-STAT 3, VENOUS BLOOD GAS (G3P V)
ACID-BASE DEFICIT: 2 mmol/L (ref 0.0–2.0)
BICARBONATE: 24.2 meq/L — AB (ref 20.0–24.0)
O2 Saturation: 71 %
TCO2: 26 mmol/L (ref 0–100)
pCO2, Ven: 47.8 mmHg (ref 45.0–50.0)
pH, Ven: 7.312 — ABNORMAL HIGH (ref 7.250–7.300)
pO2, Ven: 41 mmHg (ref 30.0–45.0)

## 2015-11-01 SURGERY — RIGHT/LEFT HEART CATH AND CORONARY ANGIOGRAPHY
Anesthesia: LOCAL

## 2015-11-01 MED ORDER — MIDAZOLAM HCL 2 MG/2ML IJ SOLN
INTRAMUSCULAR | Status: DC | PRN
  Administered 2015-11-01: 1 mg via INTRAVENOUS

## 2015-11-01 MED ORDER — FENTANYL CITRATE (PF) 100 MCG/2ML IJ SOLN
INTRAMUSCULAR | Status: DC | PRN
  Administered 2015-11-01: 25 ug via INTRAVENOUS

## 2015-11-01 MED ORDER — SODIUM CHLORIDE 0.9 % IV SOLN: 250.0000 mL | INTRAVENOUS | Status: DC | PRN

## 2015-11-01 MED ORDER — SODIUM CHLORIDE 0.9 % IJ SOLN: 3.0000 mL | INTRAMUSCULAR | Status: DC | PRN

## 2015-11-01 MED ORDER — HEPARIN (PORCINE) IN NACL 2-0.9 UNIT/ML-% IJ SOLN
INTRAMUSCULAR | Status: AC
  Filled 2015-11-01: qty 1000

## 2015-11-01 MED ORDER — NITROGLYCERIN 1 MG/10 ML FOR IR/CATH LAB
INTRA_ARTERIAL | Status: AC
  Filled 2015-11-01: qty 10

## 2015-11-01 MED ORDER — ACETAMINOPHEN 325 MG PO TABS: 650.0000 mg | ORAL_TABLET | ORAL | Status: DC | PRN

## 2015-11-01 MED ORDER — SODIUM CHLORIDE 0.9 % WEIGHT BASED INFUSION: 1.0000 mL/kg/h | INTRAVENOUS | Status: DC

## 2015-11-01 MED ORDER — IOHEXOL 350 MG/ML SOLN
INTRAVENOUS | Status: DC | PRN
  Administered 2015-11-01: 75 mL via INTRAVENOUS

## 2015-11-01 MED ORDER — LIDOCAINE HCL (PF) 1 % IJ SOLN
INTRAMUSCULAR | Status: AC
  Filled 2015-11-01: qty 30

## 2015-11-01 MED ORDER — SODIUM CHLORIDE 0.9 % WEIGHT BASED INFUSION: 3.0000 mL/kg/h | INTRAVENOUS | Status: DC

## 2015-11-01 MED ORDER — MIDAZOLAM HCL 2 MG/2ML IJ SOLN
INTRAMUSCULAR | Status: AC
  Filled 2015-11-01: qty 2

## 2015-11-01 MED ORDER — FENTANYL CITRATE (PF) 100 MCG/2ML IJ SOLN
INTRAMUSCULAR | Status: AC
  Filled 2015-11-01: qty 2

## 2015-11-01 MED ORDER — ONDANSETRON HCL 4 MG/2ML IJ SOLN: 4.0000 mg | Freq: Four times a day (QID) | INTRAMUSCULAR | Status: DC | PRN

## 2015-11-01 MED ORDER — HEPARIN SODIUM (PORCINE) 1000 UNIT/ML IJ SOLN
INTRAMUSCULAR | Status: AC
  Filled 2015-11-01: qty 1

## 2015-11-01 MED ORDER — HEPARIN SODIUM (PORCINE) 1000 UNIT/ML IJ SOLN
INTRAMUSCULAR | Status: DC | PRN
  Administered 2015-11-01: 5500 [IU] via INTRAVENOUS

## 2015-11-01 MED ORDER — ASPIRIN 81 MG PO CHEW
CHEWABLE_TABLET | ORAL | Status: AC
  Filled 2015-11-01: qty 1

## 2015-11-01 MED ORDER — VERAPAMIL HCL 2.5 MG/ML IV SOLN
INTRAVENOUS | Status: DC | PRN
  Administered 2015-11-01: 08:00:00 via INTRA_ARTERIAL

## 2015-11-01 MED ORDER — SODIUM CHLORIDE 0.9 % WEIGHT BASED INFUSION
3.0000 mL/kg/h | INTRAVENOUS | Status: AC
  Administered 2015-11-01: 3 mL/kg/h via INTRAVENOUS

## 2015-11-01 MED ORDER — NITROGLYCERIN 1 MG/10 ML FOR IR/CATH LAB
INTRA_ARTERIAL | Status: DC | PRN
  Administered 2015-11-01: 09:00:00

## 2015-11-01 MED ORDER — ASPIRIN 81 MG PO CHEW
81.0000 mg | CHEWABLE_TABLET | ORAL | Status: AC
  Administered 2015-11-01: 81 mg via ORAL

## 2015-11-01 MED ORDER — SODIUM CHLORIDE 0.9 % IJ SOLN: 3.0000 mL | Freq: Two times a day (BID) | INTRAMUSCULAR | Status: DC

## 2015-11-01 MED ORDER — VERAPAMIL HCL 2.5 MG/ML IV SOLN
INTRAVENOUS | Status: AC
  Filled 2015-11-01: qty 2

## 2015-11-01 SURGICAL SUPPLY — 15 items
CATH BALLN WEDGE 5F 110CM (CATHETERS) ×2 IMPLANT
CATH INFINITI 5 FR JL3.5 (CATHETERS) ×2 IMPLANT
CATH INFINITI 5FR ANG PIGTAIL (CATHETERS) ×2 IMPLANT
CATH INFINITI JR4 5F (CATHETERS) ×2 IMPLANT
DEVICE RAD COMP TR BAND LRG (VASCULAR PRODUCTS) ×2 IMPLANT
GLIDESHEATH SLEND SS 6F .021 (SHEATH) ×2 IMPLANT
KIT HEART LEFT (KITS) ×2 IMPLANT
KIT HEART RIGHT NAMIC (KITS) ×2 IMPLANT
PACK CARDIAC CATHETERIZATION (CUSTOM PROCEDURE TRAY) ×2 IMPLANT
SHEATH FAST CATH BRACH 5F 5CM (SHEATH) ×2 IMPLANT
SYR MEDRAD MARK V 150ML (SYRINGE) ×2 IMPLANT
TRANSDUCER W/STOPCOCK (MISCELLANEOUS) ×3 IMPLANT
TUBING CIL FLEX 10 FLL-RA (TUBING) ×2 IMPLANT
WIRE HI TORQ VERSACORE-J 145CM (WIRE) ×3 IMPLANT
WIRE SAFE-T 1.5MM-J .035X260CM (WIRE) ×2 IMPLANT

## 2015-11-01 NOTE — Interval H&P Note (Signed)
Cath Lab Visit (complete for each Cath Lab visit)  Clinical Evaluation Leading to the Procedure:   ACS: No.  Non-ACS:    Anginal Classification: CCS III  Anti-ischemic medical therapy: Minimal Therapy (1 class of medications)  Non-Invasive Test Results: No non-invasive testing performed  Prior CABG: No previous CABG      History and Physical Interval Note:  11/01/2015 7:46 AM  [name]  has presented today for surgery, with the diagnosis of cp, sob  The various methods of treatment have been discussed with the patient and family. After consideration of risks, benefits and other options for treatment, the patient has consented to  Procedure(s): Right/Left Heart Cath and Coronary Angiography (N/A) as a surgical intervention .  The patient's history has been reviewed, patient examined, no change in status, stable for surgery.  I have reviewed the patient's chart and labs.  Questions were answered to the patient's satisfaction.     Toma Erichsen [employer]

## 2015-11-01 NOTE — H&P (View-Only) (Signed)
Patient ID: Sophia Kelley, female   DOB: 02/25/1951, 64 y.o.   MRN: [id number] PCP: Dr Sam Kelly  64 yo with history of OSA, obesity, and COPD presents for cardiology followup.  She has had 2 months of dyspnea and central chest heaviness with exertion.  This tends to happen a couple of times a day.  Symptoms occur after walking 100-200 feet of walking and resolve with rest.  They have gradually worsened.  She continues to smoke.  She was recently admitted to the hospital in [place] with chest pain and dyspnea.  She was ruled out for MI and had CTA chest showing no PE or PNA.  She was told that she needed to see a cardiologist after discharge.  She was also noted to have abnormal thyroid labs with low TSH, elevated free T4 and low free T3.  She carries a prior history of hypothyroidism but has not been on medication for this.    Labs (5/16): K 5, creatinine 0.78, LDL 87, HDL 46  ECG: NSR, normal  Past Medical History: 1. HYPERLIPIDEMIA  2. COLONIC POLYPS, ADENOMATOUS 3. IRRITABLE BOWEL SYNDROME  4. HEMORRHOIDS  5. COLONIC INERTIA 6. COPD: Active smoker 7. OSA: Severe.  Not currently using CPAP. 8. Obesity 9. Allergic rhinitis - RAST positive, IgE 1569 from Jan 23, 2011 10. Dyspnea - PFTs 02/03/11>>FEV1 2.64(107%), FEV1% 76, TLC 5.44(103%), DLCO 67%, no BD - Echo (1/12): EF 55-60%, mild LVH, grade I diastolic dysfunction, ? 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  11. Hypothyroidism 12. Occipital neuralgia.   Family History: Father - MI age 48 Brother - Prostate cancer Multiple relatives on father's side with early CAD.   Social History: Married. Unemployed. Started smoking age 22, and currently smokes 1/2 pack per day. No significant alcohol use. Lives in [place].   ROS: All systems reviewed and negative except as per HPI.   Current Outpatient Prescriptions  Medication Sig Dispense Refill  . acetaminophen  (TYLENOL) 325 MG tablet Take 2 tablets (650 mg total) by mouth every 6 (six) hours as needed for mild pain, moderate pain, fever or headache (Do not take more than 4000 mg per day at home.  it is in your prescription medicine.).    . albuterol (PROVENTIL HFA;VENTOLIN HFA) 108 (90 BASE) MCG/ACT inhaler Inhale into the lungs.    . aspirin 81 MG EC tablet Take 81 mg by mouth daily.     . clindamycin (CLEOCIN T) 1 % external solution Apply topically.    . fluticasone (FLONASE) 50 MCG/ACT nasal spray Place 2 sprays into both nostrils daily.    . ibuprofen (ADVIL,MOTRIN) 200 MG tablet You can take 2-3 tablets every 8 hours for pain. 30 tablet 0  . KRILL OIL PO Take 1 capsule by mouth daily.    . Linaclotide (LINZESS) 290 MCG CAPS capsule Take 1 capsule (290 mcg total) by mouth daily. 30 capsule 10  . Linaclotide (LINZESS) 290 MCG CAPS capsule Take by mouth.    . Multiple Vitamins-Minerals (CENTRUM ADULTS PO) Take by mouth daily.    . nystatin cream (MYCOSTATIN) Apply topically.    . vitamin B-12 (CYANOCOBALAMIN) 1000 MCG tablet Take 1,000 mcg by mouth daily.    . Vitamin D, Ergocalciferol, (DRISDOL) 50000 UNITS CAPS capsule Take by mouth.    . nitroGLYCERIN (NITROSTAT) 0.4 MG SL tablet Place 1 tablet (0.4 mg total) under the tongue every 5 (five) minutes as needed   for chest pain. 90 tablet 3   Current Facility-Administered Medications  Medication Dose Route Frequency Provider Last Rate Last Dose  . cyanocobalamin ((VITAMIN B-12)) injection 1,000 mcg  1,000 mcg Intramuscular Once Thomas L Jones, MD      . pneumococcal 23 valent vaccine (PNU-IMMUNE) injection 0.5 mL  0.5 mL Intramuscular Once Thomas L Jones, MD       BP 128/86 mmHg  Pulse 93  Ht 5' 6" (1.676 m)  Wt 287 lb 12.8 oz (130.545 kg)  BMI 46.47 kg/m2 General: NAD, obese. Neck: No JVD, no thyromegaly or thyroid nodule.  Lungs: Clear to auscultation bilaterally with normal respiratory effort. CV: Nondisplaced PMI.  Heart regular S1/S2, no  S3/S4, no murmur.  No peripheral edema.  No carotid bruit.  Normal pedal pulses.  Abdomen: Soft, nontender, no hepatosplenomegaly, no distention.  Skin: Intact without lesions or rashes.  Neurologic: Alert and oriented x 3.  Psych: Normal affect. Extremities: No clubbing or cyanosis.  HEENT: Normal.   Assessment/Plan: 1. Exertional chest pain/dyspnea: Symptoms are new over the last few weeks and progressively worsening.  She has chest tightness and dyspnea with exertion resolving with rest.  She is an active smoker.  She has a family history of premature CAD.  Her symptoms are concerning enough that I think she needs a cardiac catheterization.  I will arrange for left and right heart cath given her symptoms.  She will continue ASA 81 daily.  I explained the risks/benefits of catheterization to her today and she agrees to proceed.  2. OSA: Severe, needs CPAP.  I will arrange for sleep study for CPAP titration . 3. COPD: Active smoking.  I strongly urged her to quit.  If cardiac cath is unremarkable, will need PFTs.   Shaughn Thomley 10/19/2015   

## 2015-11-01 NOTE — Discharge Instructions (Signed)
Radial Site Care °Refer to this sheet in the next few weeks. These instructions provide you with information about caring for yourself after your procedure. Your health care provider may also give you more specific instructions. Your treatment has been planned according to current medical practices, but problems sometimes occur. Call your health care provider if you have any problems or questions after your procedure. °WHAT TO EXPECT AFTER THE PROCEDURE °After your procedure, it is typical to have the following: °· Bruising at the radial site that usually fades within 1-2 weeks. °· Blood collecting in the tissue (hematoma) that may be painful to the touch. It should usually decrease in size and tenderness within 1-2 weeks. °HOME CARE INSTRUCTIONS °· Take medicines only as directed by your health care provider. °· You may shower 24-48 hours after the procedure or as directed by your health care provider. Remove the bandage (dressing) and gently wash the site with plain soap and water. Pat the area dry with a clean towel. Do not rub the site, because this may cause bleeding. °· Do not take baths, swim, or use a hot tub until your health care provider approves. °· Check your insertion site every day for redness, swelling, or drainage. °· Do not apply powder or lotion to the site. °· Do not flex or bend the affected arm for 24 hours or as directed by your health care provider. °· Do not push or pull heavy objects with the affected arm for 24 hours or as directed by your health care provider. °· Do not lift over 10 lb (4.5 kg) for 5 days after your procedure or as directed by your health care provider. °· Ask your health care provider when it is okay to: °¨ Return to work or school. °¨ Resume usual physical activities or sports. °¨ Resume sexual activity. °· Do not drive home if you are discharged the same day as the procedure. Have someone else drive you. °· You may drive 24 hours after the procedure unless otherwise  instructed by your health care provider. °· Do not operate machinery or power tools for 24 hours after the procedure. °· If your procedure was done as an outpatient procedure, which means that you went home the same day as your procedure, a responsible adult should be with you for the first 24 hours after you arrive home. °· Keep all follow-up visits as directed by your health care provider. This is important. °SEEK MEDICAL CARE IF: °· You have a fever. °· You have chills. °· You have increased bleeding from the radial site. Hold pressure on the site. °SEEK IMMEDIATE MEDICAL CARE IF: °· You have unusual pain at the radial site. °· You have redness, warmth, or swelling at the radial site. °· You have drainage (other than a small amount of blood on the dressing) from the radial site. °· The radial site is bleeding, and the bleeding does not stop after 30 minutes of holding steady pressure on the site. °· Your arm or hand becomes pale, cool, tingly, or numb. °  °This information is not intended to replace advice given to you by your health care provider. Make sure you discuss any questions you have with your health care provider. °  °Document Released: 01/02/2011 Document Revised: 12/21/2014 Document Reviewed: 06/18/2014 °Elsevier Interactive Patient Education ©2016 Elsevier Inc. ° °

## 2015-11-04 ENCOUNTER — Telehealth: Payer: Self-pay | Admitting: Internal Medicine

## 2015-11-04 NOTE — Telephone Encounter (Signed)
Left message for patient to return my call.

## 2015-11-04 NOTE — Telephone Encounter (Signed)
Appointment scheduled for January with Dr. Silverio Decamp.  Correction on previous note. Linzess is now too expensive for patient to afford.

## 2015-11-04 NOTE — Telephone Encounter (Signed)
patient husband states that linzess has changed tiers on patient ins and is not too expensive for patient to afford. He is wanting [place] to call her insurance company to see if we can get a prior auth. he state that insurance will change next year but needs one more refill until then.   [place]  ID [id number]

## 2015-11-04 NOTE — Telephone Encounter (Signed)
Pt needs an appointment with another physician here. Thank you.

## 2015-11-05 ENCOUNTER — Telehealth: Payer: Self-pay | Admitting: Internal Medicine

## 2015-11-05 ENCOUNTER — Ambulatory Visit: Payer: 59 | Admitting: Cardiology

## 2015-11-06 ENCOUNTER — Encounter: Payer: Self-pay | Admitting: Cardiology

## 2015-11-06 ENCOUNTER — Encounter: Payer: Self-pay | Admitting: Internal Medicine

## 2015-11-06 ENCOUNTER — Ambulatory Visit (INDEPENDENT_AMBULATORY_CARE_PROVIDER_SITE_OTHER): Payer: 59 | Admitting: Internal Medicine

## 2015-11-06 VITALS — BP 120/88 | HR 91 | Temp 97.8°F | Resp 12 | Ht 65.0 in | Wt 272.4 lb

## 2015-11-06 DIAGNOSIS — E0580 Other thyrotoxicosis without thyrotoxic crisis or storm: Secondary | ICD-10-CM | POA: Diagnosis not present

## 2015-11-06 LAB — TSH: TSH: 0.16 u[IU]/mL — ABNORMAL LOW (ref 0.35–4.50)

## 2015-11-06 LAB — T4, FREE: FREE T4: 1.15 ng/dL (ref 0.60–1.60)

## 2015-11-06 LAB — T3, FREE: T3, Free: 3.6 pg/mL (ref 2.3–4.2)

## 2015-11-06 NOTE — Patient Instructions (Signed)
Please stop at the lab.  Please come back for a follow-up appointment in 3 months.  Hyperthyroidism Hyperthyroidism is when the thyroid is too active (overactive). Your thyroid is a large gland that is located in your neck. The thyroid helps to control how your body uses food (metabolism). When your thyroid is overactive, it produces too much of a hormone called thyroxine.  CAUSES Causes of hyperthyroidism may include:  Graves disease. This is when your immune system attacks the thyroid gland. This is the most common cause.  Inflammation of the thyroid gland.  Tumor in the thyroid gland or somewhere else.  Excessive use of thyroid medicines, including:  Prescription thyroid supplement.  Herbal supplements that mimic thyroid hormones.  Solid or fluid-filled lumps within your thyroid gland (thyroid nodules).  Excessive ingestion of iodine. RISK FACTORS  Being female.  Having a family history of thyroid conditions. SIGNS AND SYMPTOMS Signs and symptoms of hyperthyroidism may include:  Nervousness.  Inability to tolerate heat.  Unexplained weight loss.  Diarrhea.  Change in the texture of hair or skin.  Heart skipping beats or making extra beats.  Rapid heart rate.  Loss of menstruation.  Shaky hands.  Fatigue.  Restlessness.  Increased appetite.  Sleep problems.  Enlarged thyroid gland or nodules. DIAGNOSIS  Diagnosis of hyperthyroidism may include:  Medical history and physical exam.  Blood tests.  Ultrasound tests. TREATMENT Treatment may include:  Medicines to control your thyroid.  Surgery to remove your thyroid.  Radiation therapy. HOME CARE INSTRUCTIONS   Take medicines only as directed by your health care provider.  Do not use any tobacco products, including cigarettes, chewing tobacco, or electronic cigarettes. If you need help quitting, ask your health care provider.  Do not exercise or do physical activity until your health care  provider approves.  Keep all follow-up appointments as directed by your health care provider. This is important. SEEK MEDICAL CARE IF:  Your symptoms do not get better with treatment.  You have fever.  You are taking thyroid replacement medicine and you:  Have depression.  Feel mentally and physically slow.  Have weight gain. SEEK IMMEDIATE MEDICAL CARE IF:   You have decreased alertness or a change in your awareness.  You have abdominal pain.  You feel dizzy.  You have a rapid heartbeat.  You have an irregular heartbeat.   This information is not intended to replace advice given to you by your health care provider. Make sure you discuss any questions you have with your health care provider.   Document Released: 11/30/2005 Document Revised: 12/21/2014 Document Reviewed: 04/17/2014 Elsevier Interactive Patient Education [employer].

## 2015-11-06 NOTE — Progress Notes (Signed)
Patient ID: Sophia Kelley, female   DOB: 1951/03/25, 64 y.o.   MRN: [id number]   HPI  Sophia Kelley is a 64 y.o.-year-old female, referred by her cardiologist, Dr Aundra Dubin, in consultation for recent thyrotoxicosis with a history of Hashimoto's thyroiditis. She saw Dr Howell Rucks when she was at [facility] before, not after she joined [employer].   Pt. has been dx with hypothyroidism in 2013; her TPO antibodies were found to be elevated, confirming Hashimoto thyroiditis. She was on Levothyroxine 137 mcg, now off for ~2 years.  She was in the hospital last week for SOB >> r/o PE and had a catheterization >> no CAD, normal EF. She had the cath on 11/01/2015 - 5 days ago.   I reviewed pt's thyroid tests: 10/10/2015: TSH <0.006, total T3 3.3 (1-1.7), free T4 1.97 (0.82-1.77) - not on thyroid hormones at that time  02/20/2015: TSH 0.04, free T4 1.25 Lab Results  Component Value Date   TSH 2.66 07/02/2011   TSH 4.24 02/20/2011   TSH 2.94 01/23/2011   TSH 2.96 11/20/2010   FREET4 0.95 02/20/2011  10/10/2015: TPO antibodies >1000   Pt describes: - + weight gain - + leg swelling and erythema - + fatigue. She takes care of her mother who broke her hip last month. Husband has diabetes. - + hoarseness  - + raspy voice - no cold intolerance, but heat intolerance - + palpitations - + tremors - no depression - + constipation - Dr. Olevia Perches started Linzess 2 years ago - + dry skin - + hair loss  Pt denies feeling nodules in neck, + hoarseness, + choking, no dysphagia/odynophagia, no SOB with lying down.  She has + FH of thyroid disorders in: mother, M aunts, all cousins. No FH of thyroid cancer.  No h/o radiation tx to head or neck. No recent use of iodine supplements.  She has wrist gout >> was on Prednisone several years ago, no steroids recently.  ROS: Constitutional: see HPI, + poor sleep Eyes: + blurry vision, no xerophthalmia ENT: no sore throat, no nodules palpated in throat, no  dysphagia/odynophagia, no hoarseness Cardiovascular: + all: CP/SOB/palpitations/leg swelling Respiratory: no cough/+ SOB Gastrointestinal: no N/V/D/+ C Musculoskeletal: no muscle/joint aches Skin: no rashes, + hair loss, + rash - legs Neurological: no tremors/numbness/tingling/dizziness, + HA Psychiatric: no depression/anxiety  Past Medical History  Diagnosis Date  . Hyperlipidemia   . Hx of adenomatous colonic polyps   . IBS (irritable bowel syndrome)   . Hemorrhoids   . Colonic inertia   . Active smoker   . OSA (obstructive sleep apnea)     Suspected  . Obesity   . Allergic rhinitis     RAST positive, IgE 1569 from 01/23/2011  . Dyspnea     PFT 02/03/11>>FEV1 2.64(107%, FEV1% 76, TLC 5.44 (103%, DLCO 67%, no BD  . Hypothyroidism   . Vitamin B12 deficiency   . PVD (peripheral vascular disease) ([place])   . Hemorrhoids   . Lumbar radiculopathy   . Colonic inertia   . Arthritis   . COPD (chronic obstructive pulmonary disease) ([place])   . Osteoporosis   . Nasal congestion   . Trouble swallowing   . Cough   . Rash   . Constipation   . Visual disturbance   . Hernia     lower back  . Vitamin D deficiency   . Chronic constipation 02/01/2014  . Fatty liver   . Sleep apnea     no cpap  . Allergy   .  Costochondritis   . DDD (degenerative disc disease), lumbar   . Impacted cerumen of both ears   . Chronic back pain   . Plantar fasciitis   . Referred otalgia of right ear   . Hearing loss   . Tinea cruris   . Occipital neuralgia of right side 05/15/2015   Past Surgical History  Procedure Laterality Date  . Cesarean section  1980, 1988    x 2  . Nasal septum surgery  1984  . Abdominal hysterectomy  1990  . Laparoscopic appendectomy N/A 01/31/2014    Procedure: APPENDECTOMY LAPAROSCOPIC;  Surgeon: Harl Bowie, MD;  Location: [facility];  Service: General;  Laterality: N/A;  . Cardiac catheterization N/A 11/01/2015    Procedure: Right/Left Heart Cath and Coronary  Angiography;  Surgeon: Larey Dresser, MD;  Location: [place] [facility];  Service: Cardiovascular;  Laterality: N/A;   Social History   Social History  . Marital Status: Married    Spouse Name: N/A  . Number of Children: 2  . Years of Education: 14   Occupational History  . unemployed   .     Social History Main Topics  . Smoking status: Light Tobacco Smoker -- 0.30 packs/day for 37 years    Types: Cigarettes  . Smokeless tobacco: Never Used  . Alcohol Use: No  . Drug Use: No  . Sexual Activity: Yes    Birth Control/ Protection: Surgical   Other Topics Concern  . Not on file   Social History Narrative   Caffeine Use - 1 cup coffee/day   Patient is right handed.         Current Outpatient Prescriptions on File Prior to Visit  Medication Sig Dispense Refill  . acetaminophen (TYLENOL) 325 MG tablet Take 2 tablets (650 mg total) by mouth every 6 (six) hours as needed for mild pain, moderate pain, fever or headache (Do not take more than 4000 mg per day at home.  it is in your prescription medicine.).    Marland Kitchen albuterol (PROVENTIL HFA;VENTOLIN HFA) 108 (90 BASE) MCG/ACT inhaler Inhale 1-2 puffs into the lungs every 4 (four) hours as needed for wheezing or shortness of breath.     Marland Kitchen aspirin 81 MG EC tablet Take 81 mg by mouth daily.     . fluticasone (FLONASE) 50 MCG/ACT nasal spray Place 2 sprays into both nostrils daily.    Marland Kitchen ibuprofen (ADVIL,MOTRIN) 200 MG tablet You can take 2-3 tablets every 8 hours for pain. (Patient taking differently: Take 600 mg by mouth every 6 (six) hours as needed (pain). You can take 2-3 tablets every 8 hours for pain.) 30 tablet 0  . Linaclotide (LINZESS) 290 MCG CAPS capsule Take 1 capsule (290 mcg total) by mouth daily. (Patient taking differently: Take 290 mcg by mouth every 3 (three) days. ) 30 capsule 10  . nitroGLYCERIN (NITROSTAT) 0.4 MG SL tablet Place 1 tablet (0.4 mg total) under the tongue every 5 (five) minutes as needed for chest pain. 90  tablet 3  . vitamin B-12 (CYANOCOBALAMIN) 1000 MCG tablet Take 1,000 mcg by mouth daily.     No current facility-administered medications on file prior to visit.   Allergies  Allergen Reactions  . Codeine Rash   Family History  Problem Relation Age of Onset  . Heart attack Father 48    x3  . Allergies Father   . Arthritis Father   . Heart disease Father   . Prostate cancer Brother   .  Colon cancer Neg Hx   . Hypertension Mother   . Arthritis Mother   . Osteoporosis Mother   . Thyroid disease Mother    PE: BP 120/88 mmHg  Pulse 91  Temp(Src) 97.8 F (36.6 C) (Oral)  Resp 12  Ht 5\' 5"  (1.651 m)  Wt 272 lb 6.4 oz (123.56 kg)  BMI 45.33 kg/m2  SpO2 97% Wt Readings from Last 3 Encounters:  11/06/15 272 lb 6.4 oz (123.56 kg)  11/01/15 247 lb (112.038 kg)  10/17/15 287 lb 12.8 oz (130.545 kg)   Constitutional: obese, in NAD Eyes: PERRLA, EOMI, no exophthalmos, no lid lag, no stare ENT: moist mucous membranes, no thyromegaly, "lumpy-bumpy" thyroid, no thyroid bruits, no cervical lymphadenopathy Cardiovascular: tachycardia, RR, No MRG Respiratory: CTA B Gastrointestinal: abdomen soft, NT, ND, BS+ Musculoskeletal: no deformities, strength intact in all 4 Skin: moist, warm, + rash ant lower legs (stasis dermatitis?) Neurological: + tremor with outstretched hands, DTR normal in all 4  ASSESSMENT: 1. Thyrotoxicosis  2. H/o Hashimoto's thyroiditis  3. Obesity  PLAN:  1. Patient with h/o hypothyroidism, off levothyroxine therapy for the last 1.5-2 years. Very confusing picture of mostly hypothyroid sxs (weight gain, fluid retention, dry skin, hoarseness) and some hyperthyroid features (tachycardia, palpitations, heat intolerance and tremors) in the presence of thyrotoxic labs (undetectable TSH high TT3 and fT4) and recent TPO Abs above the upper detection limit. She could have developed hyperthyroidism or thyroiditis on a base of hypothyroidism, but it is still a very unusual  scenario.  - We discussed that possible causes of thyrotoxicosis are:  Graves ds   Thyroiditis toxic multinodular goiter/ toxic adenoma (her thyroid today feels "lumpy-bumpy"). - I suggested that we check the TSH, fT3 and fT4 and also add thyroid stimulating antibodies to screen for Graves' disease.  - If the tests remain abnormal, we may need an uptake and scan to differentiate between the 3 above possible etiologies  - we discussed about possible modalities of treatment for the above conditions, to include methimazole use, radioactive iodine ablation or (last resort) surgery. As she just had a cath >> we have to wait ~ 2 months before we can do a thyroid Uptake and Scan (2/2 high Iodine load). Until then, if needed, we need to use MMI.  - I did advice her that we might need to do thyroid ultrasound depending on the results of the uptake and scan (if a cold nodule is present) - I will not add beta blockers at this time, will wait for the labs first. She will see her cardiologist in 1 week >> will leave this at the latitude of the cardiologist. - If labs are abnormal, she will need to return in 6 weeks for repeat labs - If labs are normal, I will see her back in 3 months  3. Obesity - We may need to check a UFC after thyroid status normalizes.   Orders Placed This Encounter  Procedures  . T3, free  . T4, free  . TSH  . Thyroid Stimulating Immunoglobulin   Component     Latest Ref Rng 11/06/2015  TSH     0.35 - 4.50 uIU/mL 0.16 (L)  T3, Free     2.3 - 4.2 pg/mL 3.6  Free T4     0.60 - 1.60 ng/dL 1.15  TSI antibodies pending.  Mild subclinical hyperthyroidism, with all tests greatly improved from the last check on 10/10/2015. It is possible that she had an episode of subacute thyroiditis, which  is now resolving. However, if tests do not improve in 2 months from now, we will need an uptake and scan to rule out toxic multinodular goiter, based on her physical exam thyroid features. I  would suggest only follow-up for now, with repeat TFTs in 4 weeks.

## 2015-11-06 NOTE — Telephone Encounter (Signed)
Prior authorization sent in to covermymeds. Waiting on response.

## 2015-11-11 NOTE — Telephone Encounter (Signed)
Sent in a prior authorization for the medication on 11/05/14 via covermymeds. Medication aapproved PA case # [id number] if any questions call [phone].

## 2015-11-11 NOTE — Telephone Encounter (Signed)
Documentation correction. Sent 11/06/2015 via covermymeds.

## 2015-11-12 NOTE — Telephone Encounter (Signed)
Called and left a message on home voicemail that medication was approved and if any further questions to give me a call.

## 2015-11-13 LAB — THYROID STIMULATING IMMUNOGLOBULIN: TSI: 331 %{baseline} — AB (ref <140)

## 2015-11-20 ENCOUNTER — Ambulatory Visit (HOSPITAL_BASED_OUTPATIENT_CLINIC_OR_DEPARTMENT_OTHER): Payer: 59

## 2015-11-25 ENCOUNTER — Ambulatory Visit: Payer: 59 | Admitting: Neurology

## 2015-12-02 ENCOUNTER — Telehealth: Payer: Self-pay | Admitting: Cardiology

## 2015-12-02 NOTE — Telephone Encounter (Signed)
New Message  Pt calling to sched sleep study- order from Nov/2016in from Dr Aundra Dubin in syst. Please call back and discuss.

## 2015-12-03 NOTE — Telephone Encounter (Signed)
Spoke with patients husband to let him know that her information will be given to the home sleep company today and they should hear something in the next couple days from the home sleep company.

## 2015-12-23 ENCOUNTER — Encounter: Payer: Self-pay | Admitting: Cardiology

## 2015-12-23 ENCOUNTER — Ambulatory Visit: Payer: 59 | Admitting: Neurology

## 2015-12-26 ENCOUNTER — Telehealth: Payer: Self-pay | Admitting: Cardiology

## 2015-12-26 NOTE — Telephone Encounter (Signed)
Spoke with pt and she wanted to make sure that we had received her sleep study. Informed her that I was able to see her sleep study in her chart. Pt verbalized understanding and was appreciative for call back.

## 2015-12-26 NOTE — Telephone Encounter (Signed)
New message     Talk to the nurse regarding sleep study from approx 1 week ago

## 2016-01-02 ENCOUNTER — Telehealth: Payer: Self-pay | Admitting: Cardiology

## 2016-01-02 DIAGNOSIS — G4733 Obstructive sleep apnea (adult) (pediatric): Secondary | ICD-10-CM

## 2016-01-02 NOTE — Telephone Encounter (Signed)
Severe OSA.  She needs referral to Dr Radford Pax for CPAP titration.

## 2016-01-02 NOTE — Telephone Encounter (Signed)
Spoke with husband regarding results.  He is going to discuss with his wife and they will call me back.

## 2016-01-02 NOTE — Telephone Encounter (Signed)
It is in Epic under media tab dated 12/20/15.

## 2016-01-02 NOTE — Telephone Encounter (Signed)
New Message   Pt husband called states that a sleep study was completed and that they have yet to receive the results. Please call back to discuss

## 2016-01-02 NOTE — Telephone Encounter (Signed)
Dr Aundra Dubin have you seen her sleep study?

## 2016-01-02 NOTE — Telephone Encounter (Signed)
Please let patient know that they have severe sleep apnea on home sleep study and recommend CPAP titration. Please set up titration in the sleep lab.

## 2016-01-02 NOTE — Telephone Encounter (Signed)
Sleep study reviewed and recommendations made by Dr Royston Cowper is handling contacting pt with results and Dr Theodosia Blender recommendations.

## 2016-01-03 ENCOUNTER — Encounter: Payer: Self-pay | Admitting: Internal Medicine

## 2016-01-03 ENCOUNTER — Ambulatory Visit (INDEPENDENT_AMBULATORY_CARE_PROVIDER_SITE_OTHER): Payer: BLUE CROSS/BLUE SHIELD | Admitting: Internal Medicine

## 2016-01-03 VITALS — BP 118/68 | HR 100 | Temp 97.9°F | Resp 14 | Wt 272.0 lb

## 2016-01-03 DIAGNOSIS — E0590 Thyrotoxicosis, unspecified without thyrotoxic crisis or storm: Secondary | ICD-10-CM

## 2016-01-03 DIAGNOSIS — Z8639 Personal history of other endocrine, nutritional and metabolic disease: Secondary | ICD-10-CM

## 2016-01-03 LAB — TSH: TSH: 0.27 u[IU]/mL — ABNORMAL LOW (ref 0.35–4.50)

## 2016-01-03 LAB — T4, FREE: FREE T4: 1.91 ng/dL — AB (ref 0.60–1.60)

## 2016-01-03 LAB — T3, FREE: T3, Free: 5.8 pg/mL — ABNORMAL HIGH (ref 2.3–4.2)

## 2016-01-03 MED ORDER — METOPROLOL SUCCINATE ER 25 MG PO TB24
25.0000 mg | ORAL_TABLET | Freq: Every day | ORAL | Status: DC
Start: 2016-01-03 — End: 2016-03-13

## 2016-01-03 NOTE — Progress Notes (Addendum)
Patient ID: Sophia Kelley, female   DOB: 05-03-1951, 65 y.o.   MRN: [id number]   HPI  Sophia Kelley is a 65 y.o.-year-old female, initially referred by her cardiologist, Dr Aundra Dubin, in consultation for recent thyrotoxicosis with a history of Hashimoto's thyroiditis.  Reviewed hx: Pt. has been dx with hypothyroidism in 2013; her TPO antibodies were found to be elevated, confirming Hashimoto thyroiditis. She was on Levothyroxine 137 mcg, now off for ~2 years.  She went to the hospital with SOB >> r/o PE and had a catheterization >> no CAD, normal EF. She had the cath on 11/01/2015.  Patient had thyrotoxic thyroid tests in 09/2015, after which she was referred to see me. By the time I saw her a month later her free T4 and free T3 were normal and her TSH is much better, therefore, we decided to recheck the thyroid tests in 1-1/2 months to see if they improve. She is here today for this and for regular visit.  I reviewed pt's thyroid tests: Lab Results  Component Value Date   TSH 0.16* 11/06/2015   TSH 2.66 07/02/2011   TSH 4.24 02/20/2011   TSH 2.94 01/23/2011   TSH 2.96 11/20/2010   FREET4 1.15 11/06/2015   FREET4 0.95 02/20/2011  10/10/2015: TPO antibodies >1000 10/10/2015: TSH <0.006, total T3 3.3 (1-1.7), free T4 1.97 (0.82-1.77) - not on thyroid hormones at that time  02/20/2015: TSH 0.04, free T4 1.25  Pt still feels poorly - she describes: - + leg swelling and erythema - + fatigue - No weight gain or loss since last visit - + hoarseness  - + raspy voice - no cold intolerance, but heat intolerance - No palpitations - + tremors - + constipation - Dr. Olevia Perches started Linzess 2 years ago - + dry skin, also rashes on arms - + hair loss  Pt denies feeling nodules in neck, + hoarseness, + choking, no dysphagia/odynophagia, no SOB with lying down.  She has + FH of thyroid disorders in: mother, M aunts, all cousins. No FH of thyroid cancer.  No h/o radiation tx to head or  neck. No recent use of iodine supplements.  She has wrist gout >> was on Prednisone several years ago, no steroids recently.  ROS: Constitutional: see HPI Eyes: + blurry vision, no xerophthalmia ENT: + see HPI Cardiovascular: + all: CP/+ SOB/no palpitations/+ leg swelling Respiratory: no cough/+ SOB Gastrointestinal: no N/V/D/+ C Musculoskeletal: no muscle/joint aches Skin: no rashes, + hair loss, + rash  Neurological:+ tremors/numbness/tingling/dizziness, + HA  I reviewed pt's medications, allergies, PMH, social hx, family hx, and changes were documented in the history of present illness. Otherwise, unchanged from my initial visit note.  Past Medical History  Diagnosis Date  . Hyperlipidemia   . Hx of adenomatous colonic polyps   . IBS (irritable bowel syndrome)   . Hemorrhoids   . Colonic inertia   . Active smoker   . OSA (obstructive sleep apnea)     Suspected  . Obesity   . Allergic rhinitis     RAST positive, IgE 1569 from 01/23/2011  . Dyspnea     PFT 02/03/11>>FEV1 2.64(107%, FEV1% 76, TLC 5.44 (103%, DLCO 67%, no BD  . Hypothyroidism   . Vitamin B12 deficiency   . PVD (peripheral vascular disease) ([place])   . Hemorrhoids   . Lumbar radiculopathy   . Colonic inertia   . Arthritis   . COPD (chronic obstructive pulmonary disease) ([place])   . Osteoporosis   .  Nasal congestion   . Trouble swallowing   . Cough   . Rash   . Constipation   . Visual disturbance   . Hernia     lower back  . Vitamin D deficiency   . Chronic constipation 02/01/2014  . Fatty liver   . Sleep apnea     no cpap  . Allergy   . Costochondritis   . DDD (degenerative disc disease), lumbar   . Impacted cerumen of both ears   . Chronic back pain   . Plantar fasciitis   . Referred otalgia of right ear   . Hearing loss   . Tinea cruris   . Occipital neuralgia of right side 05/15/2015   Past Surgical History  Procedure Laterality Date  . Cesarean section  1980, 1988    x 2  . Nasal septum  surgery  1984  . Abdominal hysterectomy  1990  . Laparoscopic appendectomy N/A 01/31/2014    Procedure: APPENDECTOMY LAPAROSCOPIC;  Surgeon: Harl Bowie, MD;  Location: [facility];  Service: General;  Laterality: N/A;  . Cardiac catheterization N/A 11/01/2015    Procedure: Right/Left Heart Cath and Coronary Angiography;  Surgeon: Larey Dresser, MD;  Location: [facility];  Service: Cardiovascular;  Laterality: N/A;   Social History   Social History  . Marital Status: Married    Spouse Name: N/A  . Number of Children: 2  . Years of Education: 14   Occupational History  . unemployed   .     Social History Main Topics  . Smoking status: Light Tobacco Smoker -- 0.30 packs/day for 37 years    Types: Cigarettes  . Smokeless tobacco: Never Used  . Alcohol Use: No  . Drug Use: No  . Sexual Activity: Yes    Birth Control/ Protection: Surgical   Other Topics Concern  . Not on file   Social History Narrative   Caffeine Use - 1 cup coffee/day   Patient is right handed.         Current Outpatient Prescriptions on File Prior to Visit  Medication Sig Dispense Refill  . acetaminophen (TYLENOL) 325 MG tablet Take 2 tablets (650 mg total) by mouth every 6 (six) hours as needed for mild pain, moderate pain, fever or headache (Do not take more than 4000 mg per day at home.  it is in your prescription medicine.).    Marland Kitchen albuterol (PROVENTIL HFA;VENTOLIN HFA) 108 (90 BASE) MCG/ACT inhaler Inhale 1-2 puffs into the lungs every 4 (four) hours as needed for wheezing or shortness of breath.     Marland Kitchen aspirin 81 MG EC tablet Take 81 mg by mouth daily.     . fluticasone (FLONASE) 50 MCG/ACT nasal spray Place 2 sprays into both nostrils daily.    Marland Kitchen ibuprofen (ADVIL,MOTRIN) 200 MG tablet You can take 2-3 tablets every 8 hours for pain. (Patient taking differently: Take 600 mg by mouth every 6 (six) hours as needed (pain). You can take 2-3 tablets every 8 hours for pain.) 30 tablet 0  . Linaclotide  (LINZESS) 290 MCG CAPS capsule Take 1 capsule (290 mcg total) by mouth daily. (Patient taking differently: Take 290 mcg by mouth every 3 (three) days. ) 30 capsule 10  . nitroGLYCERIN (NITROSTAT) 0.4 MG SL tablet Place 1 tablet (0.4 mg total) under the tongue every 5 (five) minutes as needed for chest pain. 90 tablet 3  . vitamin B-12 (CYANOCOBALAMIN) 1000 MCG tablet Take 1,000 mcg by mouth daily.  No current facility-administered medications on file prior to visit.   Allergies  Allergen Reactions  . Codeine Rash   Family History  Problem Relation Age of Onset  . Heart attack Father 48    x3  . Allergies Father   . Arthritis Father   . Heart disease Father   . Prostate cancer Brother   . Colon cancer Neg Hx   . Hypertension Mother   . Arthritis Mother   . Osteoporosis Mother   . Thyroid disease Mother    PE: BP 118/68 mmHg  Pulse 100  Temp(Src) 97.9 F (36.6 C) (Oral)  Resp 14  Wt 272 lb (123.378 kg)  SpO2 95% Body mass index is 45.26 kg/(m^2). Wt Readings from Last 3 Encounters:  01/03/16 272 lb (123.378 kg)  11/06/15 272 lb 6.4 oz (123.56 kg)  11/01/15 247 lb (112.038 kg)   Constitutional: obese, in NAD Eyes: PERRLA, EOMI, no exophthalmos, no lid lag, no stare ENT: moist mucous membranes, no thyromegaly, "lumpy-bumpy" thyroid (prominent L lobe)no cervical lymphadenopathy Cardiovascular: tachycardia, RR, No MRG Respiratory: CTA B Gastrointestinal: abdomen soft, NT, ND, BS+ Musculoskeletal: no deformities, strength intact in all 4 Skin: moist, warm, + rash ant lower legs (stasis dermatitis?) Neurological: + tremor with outstretched hands, DTR normal in all 4  ASSESSMENT: 1. Thyrotoxicosis  2. H/o Hashimoto's thyroiditis  PLAN:  1. And 2. Patient with h/o hypothyroidism, off levothyroxine therapy for the last 2 years. At last visit, she had a very confusing picture of mostly hypothyroid sxs (weight gain, fluid retention, dry skin, hoarseness) and some  hyperthyroid features (tachycardia, heat intolerance and tremors) despite improvement of TFTs. Her TPO ABs were >1000, so possibly Hashimoto's thyroiditis, thyrotoxic phase. As her sugars were much better when I saw her in 10/2015, we decided to just follow her thyroid tests and see if she starts feeling better and if they improve. We opted not to do an uptake and scan for this reason, but also because she had a recent heart catheterization (the high iodine load makes the uptake and scan test uninterpretable). - We will recheck her TSH, fT3 and fT4 today, and, if abnormal, we can go ahead with the uptake and scan now, 2 months from her catheterization. She agrees with this, especially since she still feeling poorly. - I did advice her that we might need to do thyroid ultrasound depending on the results of the uptake and scan (if a cold nodule is present). Her thyroid is asymmetrical at palpation, with a possible left lobe enlargement versus nodule. This will hopefully be apparent on the uptake and scan. - If labs are abnormal, she will need to return in 6 weeks for repeat labs - If labs are normal, I will see her back in 3 months  Component     Latest Ref Rng 01/03/2016  TSH     0.35 - 4.50 uIU/mL 0.27 (L)  T3, Free     2.3 - 4.2 pg/mL 5.8 (H)  Free T4     0.60 - 1.60 ng/dL 1.91 (H)   Patient's thyroid tests are all of normal (TSH mildly low, free T4 and free T3 mildly elevated), pointing towards recurrent hyperthyroidism. At this point, we can perform the thyroid uptake and scan. We will start a beta blocker, Toprol-XL 25 mg daily and may need specific thyroid treatment depending on the results of the scan.  CLINICAL DATA: Evaluate with thyrotoxicosis. Lethargy, weight gain, hair loss, heat intolerance, heart palpitations.  TSH equal 0.16  EXAM: THYROID SCAN AND UPTAKE - 24 HOURS  TECHNIQUE: Following the per oral administration of I-131 sodium iodide, the patient returned at 24 hours  and uptake measurements were acquired with the uptake probe centered on the neck. Thyroid imaging was performed following the intravenous administration of the Tc-24m Pertechnetate.  RADIOPHARMACEUTICALS: 8.1 microCuries I-131 sodium iodide orally and 10.1 mCi Technetium-41m pertechnetate IV  COMPARISON: None  FINDINGS: There is uniform uptake within an enlarged thyroid gland no nodularity. The pyramidal lobe is faintly identified.  24 hour I 131 uptake = 38 .8% (normal 10-30%)  IMPRESSION: Imaging and thyroid uptake are most suggestive of Graves disease.   Electronically Signed By: Suzy Bouchard M.D. On: 01/28/2016 15:46  New diagnosis of Graves' disease. Since she is symptomatic, will start the low-dose methimazole, 5 mg daily, and repeat the tests in 5-6 weeks. At next visit we'll discuss about radioactive iodine treatment, depending on her response to methimazole.

## 2016-01-03 NOTE — Patient Instructions (Signed)
Please stop at the lab.  Please come back for a follow-up appointment in 3 months  

## 2016-01-07 ENCOUNTER — Ambulatory Visit (INDEPENDENT_AMBULATORY_CARE_PROVIDER_SITE_OTHER): Payer: BLUE CROSS/BLUE SHIELD | Admitting: Gastroenterology

## 2016-01-07 ENCOUNTER — Encounter: Payer: Self-pay | Admitting: Gastroenterology

## 2016-01-07 VITALS — BP 132/84 | HR 76 | Ht 65.0 in | Wt 271.1 lb

## 2016-01-07 DIAGNOSIS — K439 Ventral hernia without obstruction or gangrene: Secondary | ICD-10-CM

## 2016-01-07 DIAGNOSIS — K5900 Constipation, unspecified: Secondary | ICD-10-CM | POA: Diagnosis not present

## 2016-01-07 MED ORDER — LINACLOTIDE 290 MCG PO CAPS: 290.0000 ug | ORAL_CAPSULE | Freq: Every day | ORAL | Status: DC

## 2016-01-07 NOTE — Progress Notes (Signed)
Sophia Kelley    [id number]    [birth year]  Primary Care 9, MD  Referring Physician: Delilah Shan, MD [id number] [facility], [place] [address]  Chief complaint:  Chronic Constipation  HPI: 65 year old white female with colonic inertia and chronic constipation. Negative Sitzmarks study in 2008. Recent colonoscopy in June 2015 showed hyperplastic polyp. Prior colonoscopies in 2001 showed adenomatous polyp. In 2003 hyperplastic polyp, in January 2011 and June, 2015 multiple hyperplastic polyps. She is currently on the Linzess 290 g every other day with 3 bowel movements a week. She c/o large ventral hernia. Patient is obese and the hernia is causing problems when she strains for stool or when she bends over or just simply when she walks around. She would like to have this surgically corrected.. She had prior appendectomy   Outpatient Encounter Prescriptions as of 01/07/2016  Medication Sig  . acetaminophen (TYLENOL) 325 MG tablet Take 2 tablets (650 mg total) by mouth every 6 (six) hours as needed for mild pain, moderate pain, fever or headache (Do not take more than 4000 mg per day at home.  it is in your prescription medicine.).  Marland Kitchen albuterol (PROVENTIL HFA;VENTOLIN HFA) 108 (90 BASE) MCG/ACT inhaler Inhale 1-2 puffs into the lungs every 4 (four) hours as needed for wheezing or shortness of breath.   Marland Kitchen aspirin 81 MG EC tablet Take 81 mg by mouth daily.   . Cholecalciferol (VITAMIN D3) 400 units CAPS Take by mouth.  . clotrimazole (LOTRIMIN) 1 % cream Apply topically.  . fluticasone (FLONASE) 50 MCG/ACT nasal spray Place 2 sprays into both nostrils daily.  Marland Kitchen ibuprofen (ADVIL,MOTRIN) 200 MG tablet You can take 2-3 tablets every 8 hours for pain. (Patient taking differently: Take 600 mg by mouth every 6 (six) hours as needed (pain). You can take 2-3 tablets every 8 hours for pain.)  . Linaclotide (LINZESS) 290 MCG CAPS capsule  Take 1 capsule (290 mcg total) by mouth daily. (Patient taking differently: Take 290 mcg by mouth every 3 (three) days. )  . metoprolol succinate (TOPROL XL) 25 MG 24 hr tablet Take 1 tablet (25 mg total) by mouth daily.  . nitroGLYCERIN (NITROSTAT) 0.4 MG SL tablet Place 1 tablet (0.4 mg total) under the tongue every 5 (five) minutes as needed for chest pain.  . vitamin B-12 (CYANOCOBALAMIN) 1000 MCG tablet Take 1,000 mcg by mouth daily.  . Linaclotide (LINZESS) 290 MCG CAPS capsule Take 1 capsule (290 mcg total) by mouth daily.   No facility-administered encounter medications on file as of 01/07/2016.    Allergies as of 01/07/2016 - Review Complete 01/07/2016  Allergen Reaction Noted  . Codeine Rash 05/29/2014    Past Medical History  Diagnosis Date  . Hyperlipidemia   . Hx of adenomatous colonic polyps   . IBS (irritable bowel syndrome)   . Hemorrhoids   . Colonic inertia   . Active smoker   . OSA (obstructive sleep apnea)     Suspected  . Obesity   . Allergic rhinitis     RAST positive, IgE 1569 from 01/23/2011  . Dyspnea     PFT 02/03/11>>FEV1 2.64(107%, FEV1% 76, TLC 5.44 (103%, DLCO 67%, no BD  . Hypothyroidism   . Vitamin B12 deficiency   . PVD (peripheral vascular disease) ([place])   . Hemorrhoids   . Lumbar radiculopathy   . Colonic inertia   . Arthritis   .  COPD (chronic obstructive pulmonary disease) ([place])   . Osteoporosis   . Nasal congestion   . Trouble swallowing   . Cough   . Rash   . Constipation   . Visual disturbance   . Hernia     lower back  . Vitamin D deficiency   . Chronic constipation 02/01/2014  . Fatty liver   . Sleep apnea     no cpap  . Allergy   . Costochondritis   . DDD (degenerative disc disease), lumbar   . Impacted cerumen of both ears   . Chronic back pain   . Plantar fasciitis   . Referred otalgia of right ear   . Hearing loss   . Tinea cruris   . Occipital neuralgia of right side 05/15/2015    Past Surgical History  Procedure  Laterality Date  . Cesarean section  1980, 1988    x 2  . Nasal septum surgery  1984  . Abdominal hysterectomy  1990  . Laparoscopic appendectomy N/A 01/31/2014    Procedure: APPENDECTOMY LAPAROSCOPIC;  Surgeon: Harl Bowie, MD;  Location: [facility];  Service: General;  Laterality: N/A;  . Cardiac catheterization N/A 11/01/2015    Procedure: Right/Left Heart Cath and Coronary Angiography;  Surgeon: Larey Dresser, MD;  Location: [facility];  Service: Cardiovascular;  Laterality: N/A;  . Appendectomy  2014    Family History  Problem Relation Age of Onset  . Heart attack Father 48    x3  . Allergies Father   . Arthritis Father   . Heart disease Father   . Prostate cancer Brother   . Colon cancer Neg Hx   . Hypertension Mother   . Arthritis Mother   . Osteoporosis Mother   . Thyroid disease Mother     Social History   Social History  . Marital Status: Married    Spouse Name: N/A  . Number of Children: 2  . Years of Education: 14   Occupational History  . unemployed   .     Social History Main Topics  . Smoking status: Light Tobacco Smoker -- 0.30 packs/day for 37 years    Types: Cigarettes  . Smokeless tobacco: Never Used  . Alcohol Use: No  . Drug Use: No  . Sexual Activity: Yes    Birth Control/ Protection: Surgical   Other Topics Concern  . Not on file   Social History Narrative   Caffeine Use - 1 cup coffee/day   Patient is right handed.            Review of systems: Review of Systems  Constitutional: Negative for fever and chills.  HENT: Negative.   Eyes: Negative for blurred vision.  Respiratory: Negative for cough, shortness of breath and wheezing.   Cardiovascular: Negative for chest pain and palpitations.  Gastrointestinal: as per HPI Genitourinary: Negative for dysuria, urgency, frequency and hematuria.  Musculoskeletal: Negative for myalgias, back pain and joint pain.  Skin: Negative for itching and rash.  Neurological:  Negative for dizziness, tremors, focal weakness, seizures and loss of consciousness.  Endo/Heme/Allergies: Negative for environmental allergies.  Psychiatric/Behavioral: Negative for depression, suicidal ideas and hallucinations.  All other systems reviewed and are negative.   Physical Exam: Filed Vitals:   01/07/16 1053  BP: 132/84  Pulse: 76   Gen:      No acute distress HEENT:  EOMI, sclera anicteric Neck:     No masses; no thyromegaly Lungs:    Clear to  auscultation bilaterally; normal respiratory effort CV:         Regular rate and rhythm; no murmurs Abd:      + bowel sounds; soft, non-tender; no palpable masses, no distension Ext:    No edema; adequate peripheral perfusion Skin:      Warm and dry; no rash Neuro: alert and oriented x 3 Psych: normal mood and affect  Data Reviewed:  Reviewed chart in epic and past GI work up as per HPI   Assessment and Plan/Recommendations: 65 year old female with morbid obesity, Hashimoto's thyroiditis and chronic constipation here for follow-up visit Currently constipation has improved on Linzess 290 g every other day She is following with endocrine for management of her thyroid disease Discussed diet and exercise with goal 5-10% of body weight loss She she would like to get her ventral hernia repairs, current insurance is not accepted by [facility]. She is also at risk for recurrence given her morbid obesity if she undergoes ventral hernia repair Due for recall colonoscopy in 2020 Return in 1 year  K. Denzil Magnuson , MD [phone] Mon-Fri 8a-5p [phone] after 5p, weekends, holidays

## 2016-01-07 NOTE — Telephone Encounter (Signed)
Left message for patient to call back  

## 2016-01-07 NOTE — Patient Instructions (Addendum)
You have been given a separate informational sheet regarding your tobacco use, the importance of quitting and local resources to help you quit. We will send Linzess to your pharmacy Follow up in 1 year

## 2016-01-08 ENCOUNTER — Telehealth: Payer: Self-pay | Admitting: Internal Medicine

## 2016-01-08 NOTE — Telephone Encounter (Signed)
Spoke with Sophia Kelley and advised her that the [facility] are the ones who get this approved w/ her ins and schedule the uptake and scans. Advised her that they will contact her once they have it scheduled. Sophia Kelley voiced understanding.

## 2016-01-08 NOTE — Telephone Encounter (Signed)
Patient stated that some one was suppose to call her and set up and appointment, she haven't heard anything yet, please advise

## 2016-01-09 ENCOUNTER — Encounter: Payer: Self-pay | Admitting: *Deleted

## 2016-01-09 ENCOUNTER — Ambulatory Visit: Payer: 59 | Admitting: Neurology

## 2016-01-09 NOTE — Telephone Encounter (Signed)
Patient's husband called to give the okay to schedule Titration.    Will schedule and mail letter to them with the date.  Patient is aware and okay with this plan.

## 2016-01-09 NOTE — Addendum Note (Signed)
Addended by: Andres Ege on: 01/09/2016 03:56 PM   Modules accepted: Orders

## 2016-01-17 ENCOUNTER — Ambulatory Visit (HOSPITAL_BASED_OUTPATIENT_CLINIC_OR_DEPARTMENT_OTHER): Payer: BLUE CROSS/BLUE SHIELD

## 2016-01-22 ENCOUNTER — Encounter (HOSPITAL_COMMUNITY): Payer: BLUE CROSS/BLUE SHIELD

## 2016-01-23 ENCOUNTER — Other Ambulatory Visit (HOSPITAL_COMMUNITY): Payer: BLUE CROSS/BLUE SHIELD

## 2016-01-27 ENCOUNTER — Encounter (HOSPITAL_COMMUNITY)
Admission: RE | Admit: 2016-01-27 | Discharge: 2016-01-27 | Disposition: A | Discharge status: Home / Self Care | Payer: BLUE CROSS/BLUE SHIELD | Source: Ambulatory Visit | Attending: Internal Medicine | Admitting: Internal Medicine

## 2016-01-27 DIAGNOSIS — E0590 Thyrotoxicosis, unspecified without thyrotoxic crisis or storm: Secondary | ICD-10-CM | POA: Insufficient documentation

## 2016-01-27 MED ORDER — SODIUM IODIDE I 131 CAPSULE
8.0500 | Freq: Once | INTRAVENOUS | Status: AC | PRN
  Administered 2016-01-27: 8.05 via ORAL

## 2016-01-28 ENCOUNTER — Ambulatory Visit (HOSPITAL_COMMUNITY)
Admission: RE | Admit: 2016-01-28 | Discharge: 2016-01-28 | Disposition: A | Discharge status: Home / Self Care | Payer: BLUE CROSS/BLUE SHIELD | Source: Ambulatory Visit | Attending: Internal Medicine | Admitting: Internal Medicine

## 2016-01-28 DIAGNOSIS — E0590 Thyrotoxicosis, unspecified without thyrotoxic crisis or storm: Secondary | ICD-10-CM | POA: Diagnosis present

## 2016-01-28 MED ORDER — SODIUM PERTECHNETATE TC 99M INJECTION
10.0000 | Freq: Once | INTRAVENOUS | Status: AC | PRN
  Administered 2016-01-28: 10.1 via INTRAVENOUS

## 2016-01-28 MED ORDER — METHIMAZOLE 5 MG PO TABS: 5.0000 mg | ORAL_TABLET | Freq: Every day | ORAL | Status: DC

## 2016-01-28 NOTE — Addendum Note (Signed)
Addended by: Philemon Kingdom on: 01/28/2016 06:13 PM   Modules accepted: Orders

## 2016-01-30 ENCOUNTER — Other Ambulatory Visit: Payer: Self-pay | Admitting: *Deleted

## 2016-01-30 MED ORDER — METHIMAZOLE 5 MG PO TABS: 5.0000 mg | ORAL_TABLET | Freq: Every day | ORAL | Status: DC

## 2016-02-04 ENCOUNTER — Encounter: Payer: Self-pay | Admitting: *Deleted

## 2016-02-04 ENCOUNTER — Telehealth: Payer: Self-pay | Admitting: *Deleted

## 2016-02-04 NOTE — Telephone Encounter (Signed)
Patient called c/o worsening SOB for 2 months. She can only go a few steps without resting and her "lungs hurt." She also st her HR increases very quickly. She was put on Toprol by Dr. Cruzita Lederer and see st "it isn't working." She also thinks she is more swollen than usual. She st her "face looks puffy." She has no VS to report. She st she is taking her medications as directed.  She st she wants a referral to pulmonary. Informed her that she needs to be reevaluated by cardiology as well if she is swollen and her HR is high. Offered patient many ASAP appointments this week, but would only take 2/24 with Cecilie Kicks. Instructed patient to call the office or go to the ED if symptoms worsen prior to appointment.

## 2016-02-04 NOTE — Telephone Encounter (Signed)
Normal cath in 11/15. Have her see Mickel Baas and can followup with me after that.

## 2016-02-04 NOTE — Telephone Encounter (Signed)
Patients husband called stating that his wife is having a lot of pain in her lungs.  She is having pain in her side that is coming when she takes a deep breath. Patient is staying short of breath that worsens when she gets up to walk.  Patient is also dizzy upon standing/walking.  Patient also states that if she lays down flat on her back she gets very dizzy. Patient has no nausea or vomiting.   Husband feels like she may need to see someone in pulmonary, but he thinks he needs a referral first.  Informed patient that I would send the message to Triage and have someone call him back for further instructions.

## 2016-02-04 NOTE — Telephone Encounter (Signed)
This encounter was created in error - please disregard.

## 2016-02-04 NOTE — Telephone Encounter (Signed)
Left message to call back  

## 2016-02-04 NOTE — Telephone Encounter (Signed)
F/u  Pt returning RN phone call. Please call back and discuss.   

## 2016-02-05 NOTE — Telephone Encounter (Signed)
Left message to call back  

## 2016-02-06 ENCOUNTER — Ambulatory Visit: Payer: 59 | Admitting: Internal Medicine

## 2016-02-06 NOTE — Telephone Encounter (Signed)
Patient cancelled appointment with Mickel Baas. Left message to call back to reschedule OV.

## 2016-02-07 ENCOUNTER — Ambulatory Visit: Payer: BLUE CROSS/BLUE SHIELD | Admitting: Cardiology

## 2016-02-12 NOTE — Telephone Encounter (Signed)
Left message on voice mail reminding her to call back and reschedule appointment. Cancelled F/U appointment on 2/23

## 2016-02-19 ENCOUNTER — Telehealth: Payer: Self-pay | Admitting: Cardiology

## 2016-02-19 DIAGNOSIS — M79605 Pain in left leg: Principal | ICD-10-CM

## 2016-02-19 DIAGNOSIS — M79604 Pain in right leg: Secondary | ICD-10-CM

## 2016-02-19 NOTE — Telephone Encounter (Signed)
Spoke with pt's husband. He reports pt's heart rate has been running around 100. Also reports he thinks pt has trouble with circulation in her legs. He reports she has pain in her feet and legs and feet appear blue.  Bluish color is worse when she sits down. This has been going on for 6-8 months.  Husband would like pt seen in office. Pt had appt with Cecilie Kicks, NP on 2/24 but cancelled this appt.  I offered appt with APP but husband refuses and wants pt seen by Dr. Aundra Dubin.  Will send this message to Dr. Aundra Dubin.  Husband also asking if Dr. Aundra Dubin will refer pt to a pulmonary doctor.

## 2016-02-19 NOTE — Telephone Encounter (Signed)
Per husb. call pt legs turn blue/purple every time she sits down.   Pt would like to see a Dr not a PA.  Pt would like a call back today please.

## 2016-02-19 NOTE — Telephone Encounter (Signed)
Ok to work her in to see me maybe next week.  Would like her to get peripheral artery doppler evaluation prior to seeing me (arterial dopplers).

## 2016-02-19 NOTE — Telephone Encounter (Signed)
Left message to call back  

## 2016-02-22 ENCOUNTER — Ambulatory Visit (HOSPITAL_BASED_OUTPATIENT_CLINIC_OR_DEPARTMENT_OTHER): Payer: BLUE CROSS/BLUE SHIELD

## 2016-02-24 ENCOUNTER — Inpatient Hospital Stay (HOSPITAL_COMMUNITY): Admission: RE | Admit: 2016-02-24 | Payer: BLUE CROSS/BLUE SHIELD | Source: Ambulatory Visit

## 2016-02-24 NOTE — Telephone Encounter (Signed)
Spoke with husband and he said there was no way the pt could have came in for appts this week as she has other appts and is taking care of her parents. Advised husband that I would send a message over to [facility] scheduler and have her schedule pt for doppler and then we can schedule the appt with Dr. Aundra Dubin. Husband verbalized understanding and was in agreement with this plan. Will send staff message to [facility].

## 2016-02-24 NOTE — Telephone Encounter (Signed)
Left message to call back  

## 2016-02-24 NOTE — Telephone Encounter (Signed)
Left message to call back Was able to get pt LE Art doppler scheduled for today at 3:30pm if she is able to come.

## 2016-02-24 NOTE — Telephone Encounter (Signed)
Follow Up:; ° ° °Returning your call. °

## 2016-02-25 NOTE — Telephone Encounter (Signed)
Appts scheduled:

## 2016-03-02 ENCOUNTER — Ambulatory Visit (HOSPITAL_COMMUNITY)
Admission: RE | Admit: 2016-03-02 | Discharge: 2016-03-02 | Disposition: A | Discharge status: Home / Self Care | Payer: BLUE CROSS/BLUE SHIELD | Source: Ambulatory Visit | Attending: Cardiovascular Disease | Admitting: Cardiovascular Disease

## 2016-03-02 ENCOUNTER — Other Ambulatory Visit: Payer: Self-pay | Admitting: Cardiology

## 2016-03-02 DIAGNOSIS — J449 Chronic obstructive pulmonary disease, unspecified: Secondary | ICD-10-CM | POA: Insufficient documentation

## 2016-03-02 DIAGNOSIS — G4733 Obstructive sleep apnea (adult) (pediatric): Secondary | ICD-10-CM | POA: Insufficient documentation

## 2016-03-02 DIAGNOSIS — Z720 Tobacco use: Secondary | ICD-10-CM | POA: Diagnosis not present

## 2016-03-02 DIAGNOSIS — M79604 Pain in right leg: Secondary | ICD-10-CM

## 2016-03-02 DIAGNOSIS — M79605 Pain in left leg: Secondary | ICD-10-CM

## 2016-03-02 DIAGNOSIS — L819 Disorder of pigmentation, unspecified: Secondary | ICD-10-CM

## 2016-03-02 DIAGNOSIS — E785 Hyperlipidemia, unspecified: Secondary | ICD-10-CM | POA: Diagnosis not present

## 2016-03-02 DIAGNOSIS — I739 Peripheral vascular disease, unspecified: Secondary | ICD-10-CM | POA: Diagnosis not present

## 2016-03-05 ENCOUNTER — Encounter (HOSPITAL_BASED_OUTPATIENT_CLINIC_OR_DEPARTMENT_OTHER): Payer: BLUE CROSS/BLUE SHIELD

## 2016-03-05 ENCOUNTER — Encounter (HOSPITAL_BASED_OUTPATIENT_CLINIC_OR_DEPARTMENT_OTHER): Payer: Self-pay

## 2016-03-06 ENCOUNTER — Encounter (HOSPITAL_BASED_OUTPATIENT_CLINIC_OR_DEPARTMENT_OTHER): Payer: Self-pay | Admitting: *Deleted

## 2016-03-06 ENCOUNTER — Emergency Department (HOSPITAL_BASED_OUTPATIENT_CLINIC_OR_DEPARTMENT_OTHER)
Admission: EM | Admit: 2016-03-06 | Discharge: 2016-03-06 | Disposition: A | Discharge status: Home / Self Care | Payer: BLUE CROSS/BLUE SHIELD | Attending: Emergency Medicine | Admitting: Emergency Medicine

## 2016-03-06 DIAGNOSIS — G8929 Other chronic pain: Secondary | ICD-10-CM | POA: Insufficient documentation

## 2016-03-06 DIAGNOSIS — J449 Chronic obstructive pulmonary disease, unspecified: Secondary | ICD-10-CM | POA: Diagnosis not present

## 2016-03-06 DIAGNOSIS — N390 Urinary tract infection, site not specified: Secondary | ICD-10-CM | POA: Insufficient documentation

## 2016-03-06 DIAGNOSIS — H9190 Unspecified hearing loss, unspecified ear: Secondary | ICD-10-CM | POA: Diagnosis not present

## 2016-03-06 DIAGNOSIS — Z9889 Other specified postprocedural states: Secondary | ICD-10-CM | POA: Insufficient documentation

## 2016-03-06 DIAGNOSIS — Z8619 Personal history of other infectious and parasitic diseases: Secondary | ICD-10-CM | POA: Diagnosis not present

## 2016-03-06 DIAGNOSIS — M810 Age-related osteoporosis without current pathological fracture: Secondary | ICD-10-CM | POA: Insufficient documentation

## 2016-03-06 DIAGNOSIS — Z7951 Long term (current) use of inhaled steroids: Secondary | ICD-10-CM | POA: Insufficient documentation

## 2016-03-06 DIAGNOSIS — Z7982 Long term (current) use of aspirin: Secondary | ICD-10-CM | POA: Diagnosis not present

## 2016-03-06 DIAGNOSIS — Z86010 Personal history of colonic polyps: Secondary | ICD-10-CM | POA: Diagnosis not present

## 2016-03-06 DIAGNOSIS — Z79899 Other long term (current) drug therapy: Secondary | ICD-10-CM | POA: Diagnosis not present

## 2016-03-06 DIAGNOSIS — G4730 Sleep apnea, unspecified: Secondary | ICD-10-CM | POA: Insufficient documentation

## 2016-03-06 DIAGNOSIS — K5900 Constipation, unspecified: Secondary | ICD-10-CM | POA: Diagnosis not present

## 2016-03-06 DIAGNOSIS — R300 Dysuria: Secondary | ICD-10-CM | POA: Diagnosis present

## 2016-03-06 DIAGNOSIS — M1990 Unspecified osteoarthritis, unspecified site: Secondary | ICD-10-CM | POA: Insufficient documentation

## 2016-03-06 DIAGNOSIS — F17210 Nicotine dependence, cigarettes, uncomplicated: Secondary | ICD-10-CM | POA: Insufficient documentation

## 2016-03-06 DIAGNOSIS — E669 Obesity, unspecified: Secondary | ICD-10-CM | POA: Insufficient documentation

## 2016-03-06 LAB — URINALYSIS, ROUTINE W REFLEX MICROSCOPIC
Glucose, UA: NEGATIVE mg/dL
Ketones, ur: 15 mg/dL — AB
Nitrite: POSITIVE — AB
Protein, ur: 100 mg/dL — AB
SPECIFIC GRAVITY, URINE: 1.02 (ref 1.005–1.030)
pH: 6 (ref 5.0–8.0)

## 2016-03-06 LAB — URINE MICROSCOPIC-ADD ON

## 2016-03-06 MED ORDER — CEPHALEXIN 500 MG PO CAPS: 500.0000 mg | ORAL_CAPSULE | Freq: Two times a day (BID) | ORAL | Status: DC

## 2016-03-06 MED ORDER — CEPHALEXIN 250 MG PO CAPS
500.0000 mg | ORAL_CAPSULE | Freq: Once | ORAL | Status: AC
  Administered 2016-03-06: 500 mg via ORAL
  Filled 2016-03-06: qty 2

## 2016-03-06 MED ORDER — PHENAZOPYRIDINE HCL 100 MG PO TABS
95.0000 mg | ORAL_TABLET | Freq: Once | ORAL | Status: AC
  Administered 2016-03-06: 100 mg via ORAL
  Filled 2016-03-06: qty 1

## 2016-03-06 MED ORDER — PHENAZOPYRIDINE HCL 200 MG PO TABS: 200.0000 mg | ORAL_TABLET | Freq: Three times a day (TID) | ORAL | Status: DC | PRN

## 2016-03-06 NOTE — Discharge Instructions (Signed)

## 2016-03-06 NOTE — ED Notes (Signed)
Pt verbalize understanding of d/c instructions and denies any further needs at this time.

## 2016-03-06 NOTE — ED Notes (Signed)
Pt. Not in a gown when RN in to assess the Pt. And she reports she just needs her lab reports so she can get medicines for a poss. Urinary tract infection.

## 2016-03-06 NOTE — ED Notes (Signed)
Urinary frequency, dysuria x 1 day.

## 2016-03-06 NOTE — ED Provider Notes (Signed)
CSN: [id number]     Arrival date & time 03/06/16  1936 History  By signing my name below, I, Nicole Kindred, attest that this documentation has been prepared under the direction and in the presence of Julianne Rice, MD.   Electronically Signed: Nicole Kindred, ED Scribe. 03/06/2016. 9:35 PM   Chief Complaint  Patient presents with  . Dysuria   The history is provided by the patient. No language interpreter was used.   HPI Comments: Sophia Kelley is a 65 y.o. female with hx of UTI who presents to the Emergency Department complaining of gradual onset, constant dysuria, onset one day ago. Pt reports associated urinary frequency. She states her symptoms are similar to her last UTI. No worsening or alleviating factors noted. Pt denies fever, vomiting, nausea, back pain, hematuria, or any other pertinent symptoms.  Past Medical History  Diagnosis Date  . Hyperlipidemia   . Hx of adenomatous colonic polyps   . IBS (irritable bowel syndrome)   . Hemorrhoids   . Colonic inertia   . Active smoker   . OSA (obstructive sleep apnea)     Suspected  . Obesity   . Allergic rhinitis     RAST positive, IgE 1569 from 01/23/2011  . Dyspnea     PFT 02/03/11>>FEV1 2.64(107%, FEV1% 76, TLC 5.44 (103%, DLCO 67%, no BD  . Hypothyroidism   . Vitamin B12 deficiency   . PVD (peripheral vascular disease) ([place])   . Hemorrhoids   . Lumbar radiculopathy   . Colonic inertia   . Arthritis   . COPD (chronic obstructive pulmonary disease) ([place])   . Osteoporosis   . Nasal congestion   . Trouble swallowing   . Cough   . Rash   . Constipation   . Visual disturbance   . Hernia     lower back  . Vitamin D deficiency   . Chronic constipation 02/01/2014  . Fatty liver   . Sleep apnea     no cpap  . Allergy   . Costochondritis   . DDD (degenerative disc disease), lumbar   . Impacted cerumen of both ears   . Chronic back pain   . Plantar fasciitis   . Referred otalgia of right ear   . Hearing loss    . Tinea cruris   . Occipital neuralgia of right side 05/15/2015   Past Surgical History  Procedure Laterality Date  . Cesarean section  1980, 1988    x 2  . Nasal septum surgery  1984  . Abdominal hysterectomy  1990  . Laparoscopic appendectomy N/A 01/31/2014    Procedure: APPENDECTOMY LAPAROSCOPIC;  Surgeon: Harl Bowie, MD;  Location: [facility];  Service: General;  Laterality: N/A;  . Cardiac catheterization N/A 11/01/2015    Procedure: Right/Left Heart Cath and Coronary Angiography;  Surgeon: Larey Dresser, MD;  Location: [facility];  Service: Cardiovascular;  Laterality: N/A;  . Appendectomy  2014   Family History  Problem Relation Age of Onset  . Heart attack Father 48    x3  . Allergies Father   . Arthritis Father   . Heart disease Father   . Prostate cancer Brother   . Colon cancer Neg Hx   . Hypertension Mother   . Arthritis Mother   . Osteoporosis Mother   . Thyroid disease Mother    Social History  Substance Use Topics  . Smoking status: Light Tobacco Smoker -- 0.30 packs/day for 37 years    Types:  Cigarettes  . Smokeless tobacco: Never Used  . Alcohol Use: No   OB History    No data available     Review of Systems  Constitutional: Negative for fever and chills.  Respiratory: Negative for shortness of breath.   Cardiovascular: Negative for chest pain.  Gastrointestinal: Negative for nausea, vomiting, abdominal pain, diarrhea, constipation and abdominal distention.  Genitourinary: Positive for dysuria, urgency and frequency. Negative for hematuria, flank pain and pelvic pain.  Musculoskeletal: Negative for myalgias and back pain.  Skin: Negative for rash and wound.  Neurological: Negative for dizziness, weakness, light-headedness, numbness and headaches.  All other systems reviewed and are negative.  Allergies  Codeine  Home Medications   Prior to Admission medications   Medication Sig Start Date End Date Taking? Authorizing Provider   acetaminophen (TYLENOL) 325 MG tablet Take 2 tablets (650 mg total) by mouth every 6 (six) hours as needed for mild pain, moderate pain, fever or headache (Do not take more than 4000 mg per day at home.  it is in your prescription medicine.). 02/01/14   Earnstine Regal, PA-C  albuterol (PROVENTIL HFA;VENTOLIN HFA) 108 (90 BASE) MCG/ACT inhaler Inhale 1-2 puffs into the lungs every 4 (four) hours as needed for wheezing or shortness of breath.  10/09/15 10/08/16  Historical Provider, MD  aspirin 81 MG EC tablet Take 81 mg by mouth daily.     Historical Provider, MD  cephALEXin (KEFLEX) 500 MG capsule Take 1 capsule (500 mg total) by mouth 2 (two) times daily. 03/06/16   Julianne Rice, MD  Cholecalciferol (VITAMIN D3) 400 units CAPS Take by mouth.    Historical Provider, MD  fluticasone (FLONASE) 50 MCG/ACT nasal spray Place 2 sprays into both nostrils daily.    Historical Provider, MD  ibuprofen (ADVIL,MOTRIN) 200 MG tablet You can take 2-3 tablets every 8 hours for pain. Patient taking differently: Take 600 mg by mouth every 6 (six) hours as needed (pain). You can take 2-3 tablets every 8 hours for pain. 02/01/14   Earnstine Regal, PA-C  Linaclotide [facility]) 290 MCG CAPS capsule Take 1 capsule (290 mcg total) by mouth daily. Patient taking differently: Take 290 mcg by mouth every 3 (three) days.  04/16/15   Lafayette Dragon, MD  Linaclotide Rolan Lipa) 290 MCG CAPS capsule Take 1 capsule (290 mcg total) by mouth daily. 01/07/16   Mauri Pole, MD  methimazole (TAPAZOLE) 5 MG tablet Take 1 tablet (5 mg total) by mouth daily. 01/30/16   Philemon Kingdom, MD  metoprolol succinate (TOPROL XL) 25 MG 24 hr tablet Take 1 tablet (25 mg total) by mouth daily. 01/03/16   Philemon Kingdom, MD  nitroGLYCERIN (NITROSTAT) 0.4 MG SL tablet Place 1 tablet (0.4 mg total) under the tongue every 5 (five) minutes as needed for chest pain. 10/17/15   Larey Dresser, MD  phenazopyridine (PYRIDIUM) 200 MG tablet Take 1 tablet  (200 mg total) by mouth 3 (three) times daily as needed for pain. 03/06/16   Julianne Rice, MD  vitamin B-12 (CYANOCOBALAMIN) 1000 MCG tablet Take 1,000 mcg by mouth daily.    Historical Provider, MD   BP 130/78 mmHg  Pulse 110  Temp(Src) 98.2 F (36.8 C) (Oral)  Resp 18  Ht 5' 5.5" (1.664 m)  Wt 270 lb (122.471 kg)  BMI 44.23 kg/m2  SpO2 98% Physical Exam  Constitutional: She is oriented to person, place, and time. She appears well-developed and well-nourished. No distress.  HENT:  Head: Normocephalic and atraumatic.  Mouth/Throat:  Oropharynx is clear and moist.  Eyes: EOM are normal. Pupils are equal, round, and reactive to light.  Neck: Normal range of motion. Neck supple.  Cardiovascular: Normal rate and regular rhythm.   Pulmonary/Chest: Effort normal and breath sounds normal. No respiratory distress. She has no wheezes. She has no rales. She exhibits no tenderness.  Abdominal: Soft. Bowel sounds are normal. She exhibits no distension and no mass. There is no tenderness. There is no rebound and no guarding.  Musculoskeletal: Normal range of motion. She exhibits no edema or tenderness.  No CVA tenderness bilaterally.  Neurological: She is alert and oriented to person, place, and time.  Skin: Skin is warm and dry. No rash noted. No erythema.  Psychiatric: She has a normal mood and affect. Her behavior is normal.  Nursing note and vitals reviewed.   ED Course  Procedures (including critical care time) DIAGNOSTIC STUDIES: Oxygen Saturation is 98% on RA, normal by my interpretation.    COORDINATION OF CARE: 8:56 PM-Discussed treatment plan which includes urinalysis and pyrimidine with pt at bedside and pt agreed to plan.   Labs Review Labs Reviewed  URINALYSIS, ROUTINE W REFLEX MICROSCOPIC (NOT AT [facility]) - Abnormal; Notable for the following:    Color, Urine RED (*)    APPearance CLOUDY (*)    Hgb urine dipstick LARGE (*)    Bilirubin Urine SMALL (*)    Ketones, ur 15 (*)     Protein, ur 100 (*)    Nitrite POSITIVE (*)    Leukocytes, UA LARGE (*)    All other components within normal limits  URINE MICROSCOPIC-ADD ON - Abnormal; Notable for the following:    Squamous Epithelial / LPF 0-5 (*)    Bacteria, UA MANY (*)    All other components within normal limits    Imaging Review No results found. I have personally reviewed and evaluated these lab results as part of my medical decision-making.   EKG Interpretation None      MDM   Final diagnoses:  UTI (lower urinary tract infection)   I personally performed the services described in this documentation, which was scribed in my presence. The recorded information has been reviewed and is accurate.   Patient is very well-appearing. Abdominal exam is benign. UA is consistent with urinary tract infection. Given dose of Keflex and Pyridium in the emergency department. Advised to follow with her primary physician and return precautions given.   Julianne Rice, MD 03/06/16 2135

## 2016-03-09 ENCOUNTER — Other Ambulatory Visit (INDEPENDENT_AMBULATORY_CARE_PROVIDER_SITE_OTHER): Payer: BLUE CROSS/BLUE SHIELD

## 2016-03-09 DIAGNOSIS — E0590 Thyrotoxicosis, unspecified without thyrotoxic crisis or storm: Secondary | ICD-10-CM

## 2016-03-09 LAB — T4, FREE: Free T4: 2.41 ng/dL — ABNORMAL HIGH (ref 0.60–1.60)

## 2016-03-09 LAB — T3, FREE: T3, Free: 7.3 pg/mL — ABNORMAL HIGH (ref 2.3–4.2)

## 2016-03-09 LAB — TSH: TSH: 0.13 u[IU]/mL — ABNORMAL LOW (ref 0.35–4.50)

## 2016-03-13 ENCOUNTER — Encounter: Payer: Self-pay | Admitting: Cardiology

## 2016-03-13 ENCOUNTER — Ambulatory Visit (INDEPENDENT_AMBULATORY_CARE_PROVIDER_SITE_OTHER): Payer: BLUE CROSS/BLUE SHIELD | Admitting: Cardiology

## 2016-03-13 VITALS — BP 128/80 | HR 91 | Ht 65.5 in | Wt 261.4 lb

## 2016-03-13 DIAGNOSIS — R000 Tachycardia, unspecified: Secondary | ICD-10-CM | POA: Diagnosis not present

## 2016-03-13 DIAGNOSIS — R002 Palpitations: Secondary | ICD-10-CM

## 2016-03-13 DIAGNOSIS — G4730 Sleep apnea, unspecified: Secondary | ICD-10-CM

## 2016-03-13 MED ORDER — METOPROLOL SUCCINATE ER 25 MG PO TB24: 25.0000 mg | ORAL_TABLET | Freq: Every day | ORAL | Status: DC

## 2016-03-13 NOTE — Patient Instructions (Signed)
Medication Instructions:  Increase Toprol XL(metoptolol succinate) to 25mg  daily  Labwork: None  Testing/Procedures: Your physician has recommended that you wear a holter monitor. Holter monitors are medical devices that record the heart's electrical activity. Doctors most often use these monitors to diagnose arrhythmias. Arrhythmias are problems with the speed or rhythm of the heartbeat. The monitor is a small, portable device. You can wear one while you do your normal daily activities. This is usually used to diagnose what is causing palpitations/syncope (passing out). 48 hour  Follow-Up: Your physician wants you to follow-up in: 6 months with Dr Aundra Dubin. (September 2017). You will receive a reminder letter in the mail two months in advance. If you don't receive a letter, please call our office to schedule the follow-up appointment.   Any Other Special Instructions Will Be Listed Below (If Applicable).     If you need a refill on your cardiac medications before your next appointment, please call your pharmacy.

## 2016-03-15 DIAGNOSIS — R000 Tachycardia, unspecified: Secondary | ICD-10-CM | POA: Insufficient documentation

## 2016-03-15 NOTE — Progress Notes (Signed)
Patient ID: Sophia Kelley, female   DOB: 1951-02-26, 65 y.o.   MRN: [id number] PCP: Dr Nilda Simmer  65 yo with history of OSA, obesity, and COPD presents for cardiology followup.  At prior appointment, she reported dyspnea and central chest heaviness with exertion.  She continues to smoke.  LHC/RHC in 11/16 showed no significant CAD and normal left and right heart filling pressures.  Subsequently, she was diagnosed with hyperthyroidism and is being treated with methimazole.    Main complaint currently is a sensation of her heart racing, especially when she exerts herself. She has a tremor.  Her legs are weak and give out easily.     Labs (5/16): K 5, creatinine 0.78, LDL 87, HDL 46  ECG: NSR, normal  Past Medical History: 1. HYPERLIPIDEMIA  2. COLONIC POLYPS, ADENOMATOUS 3. IRRITABLE BOWEL SYNDROME  4. HEMORRHOIDS  5. COLONIC INERTIA 6. COPD: Active smoker 7. OSA: Severe.  Not currently using CPAP. 8. Obesity 9. Allergic rhinitis - RAST positive, IgE 1569 from Jan 23, 2011 10. Dyspnea - PFTs 02/03/11>>FEV1 2.64(107%), FEV1% 76, TLC 5.44(103%), DLCO 67%, no BD - Echo (1/12): EF 55-60%, mild LVH, grade I diastolic dysfunction, ? 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  - LHC/RHC (11/16) with no significant CAD; mean RA 7, PA 32/10 mean 21, mean PCWP 9, CI 2.44. 11. Hyperthyroidism: Graves disease.   12. Occipital neuralgia.  13. Lower extremity dopplers (3/17) with no significant PAD.   Family History: Father - MI age 49 Brother - Prostate cancer Multiple relatives on father's side with early CAD.   Social History: Married. Unemployed. Started smoking age 39, and currently smokes 1/2 pack per day. No significant alcohol use. Lives in [place].   ROS: All systems reviewed and negative except as per HPI.   Current Outpatient Prescriptions  Medication Sig Dispense Refill  . acetaminophen (TYLENOL) 325 MG tablet Take 2  tablets (650 mg total) by mouth every 6 (six) hours as needed for mild pain, moderate pain, fever or headache (Do not take more than 4000 mg per day at home.  it is in your prescription medicine.).    Marland Kitchen albuterol (PROVENTIL HFA;VENTOLIN HFA) 108 (90 BASE) MCG/ACT inhaler Inhale 1-2 puffs into the lungs every 4 (four) hours as needed for wheezing or shortness of breath.     Marland Kitchen aspirin 81 MG EC tablet Take 81 mg by mouth daily.     . Cholecalciferol (VITAMIN D3) 400 units CAPS Take by mouth.    . fluticasone (FLONASE) 50 MCG/ACT nasal spray Place 2 sprays into both nostrils daily.    Marland Kitchen ibuprofen (ADVIL,MOTRIN) 200 MG tablet Take 200 mg by mouth every 6 (six) hours as needed.    . Linaclotide (LINZESS) 290 MCG CAPS capsule Take 1 capsule (290 mcg total) by mouth daily. 30 capsule 11  . Linaclotide (LINZESS) 290 MCG CAPS capsule Take 290 mcg by mouth daily.    . methimazole (TAPAZOLE) 5 MG tablet Take 1 tablet (5 mg total) by mouth daily. 30 tablet 2  . metoprolol succinate (TOPROL XL) 25 MG 24 hr tablet Take 1 tablet (25 mg total) by mouth daily. 90 tablet 1  . nitroGLYCERIN (NITROSTAT) 0.4 MG SL tablet Place 1 tablet (0.4 mg total) under the tongue every 5 (five) minutes as needed for chest pain. 90 tablet 3  . phenazopyridine (PYRIDIUM) 200 MG tablet Take 1 tablet (200 mg total) by mouth 3 (three)  times daily as needed for pain. 6 tablet 0  . vitamin B-12 (CYANOCOBALAMIN) 1000 MCG tablet Take 1,000 mcg by mouth daily.     No current facility-administered medications for this visit.   BP 128/80 mmHg  Pulse 91  Ht 5' 5.5" (1.664 m)  Wt 261 lb 6.4 oz (118.57 kg)  BMI 42.82 kg/m2 General: NAD, obese. Neck: No JVD, diffusely enlarged thyroid gland.  Lungs: Clear to auscultation bilaterally with normal respiratory effort. CV: Nondisplaced PMI.  Heart regular S1/S2, no S3/S4, no murmur.  No peripheral edema.  No carotid bruit.  Normal pedal pulses.  Abdomen: Soft, nontender, no hepatosplenomegaly, no  distention.  Skin: Intact without lesions or rashes.  Neurologic: Alert and oriented x 3.  Psych: Normal affect. Extremities: No clubbing or cyanosis.  HEENT: Normal.   Assessment/Plan: 1. Exertional chest pain/dyspnea: Left and right heart cath in 11/16 was normal. She has not had any further chest pain and still has stable dyspnea.   2. OSA: Severe, needs CPAP. Followed by Dr Radford Pax.  3. COPD: Active smoking.  I strongly urged her to quit.   4. Hyperthyroidism: I think a lot of her symptomatology is due to hyperthyroidism.  She is followed by Dr Cruzita Lederer.  HR continues to run high and she feels palpitations.  - Increase Toprol XL to 25 mg bid.  - 48 hour holter.  Followup in 6 months.   Loralie Champagne 03/15/2016

## 2016-03-16 ENCOUNTER — Institutional Professional Consult (permissible substitution): Payer: BLUE CROSS/BLUE SHIELD | Admitting: Pulmonary Disease

## 2016-04-02 ENCOUNTER — Ambulatory Visit (INDEPENDENT_AMBULATORY_CARE_PROVIDER_SITE_OTHER): Payer: BLUE CROSS/BLUE SHIELD | Admitting: Internal Medicine

## 2016-04-02 VITALS — BP 114/64 | HR 114 | Temp 98.1°F | Resp 12 | Wt 257.0 lb

## 2016-04-02 DIAGNOSIS — E0500 Thyrotoxicosis with diffuse goiter without thyrotoxic crisis or storm: Secondary | ICD-10-CM | POA: Insufficient documentation

## 2016-04-02 DIAGNOSIS — Z8639 Personal history of other endocrine, nutritional and metabolic disease: Secondary | ICD-10-CM

## 2016-04-02 LAB — T4, FREE: Free T4: 1.73 ng/dL — ABNORMAL HIGH (ref 0.60–1.60)

## 2016-04-02 LAB — TSH: TSH: 0.15 u[IU]/mL — ABNORMAL LOW (ref 0.35–4.50)

## 2016-04-02 LAB — T3, FREE: T3, Free: 5.2 pg/mL — ABNORMAL HIGH (ref 2.3–4.2)

## 2016-04-02 NOTE — Progress Notes (Signed)
Patient ID: Sophia Kelley, female   DOB: May 26, 1951, 65 y.o.   MRN: [id number]   HPI  Sophia Kelley is a 65 y.o.-year-old female, initially referred by her cardiologist, Dr Aundra Dubin, now returning for recent thyrotoxicosis (new dx of Graves ds.) and a history of Hashimoto's thyroiditis. Last visit 3 mo ago  Reviewed and addended hx: Pt. has been dx with hypothyroidism in 2013; her TPO antibodies were found to be elevated, confirming Hashimoto thyroiditis. She was on Levothyroxine 137 mcg, now off for ~2 years.  She went to the hospital with SOB >> r/o PE and had a catheterization >> no CAD, normal EF. She had the cath on 11/01/2015.  Patient had thyrotoxic thyroid tests in 09/2015, after which she was referred to see me. By the time I saw her a month later her free T4 and free T3 were normal and her TSH was much better, therefore, we decided to recheck the thyroid tests in 1-1/2 months to see if they improve. They did not >> we checked an Uptake and scan:  Uptake and scan (01/28/2016):  There is uniform uptake within an enlarged thyroid gland no nodularity. The pyramidal lobe is faintly identified.  24 hour I 131 uptake = 38 .8% (normal 10-30%)  IMPRESSION: Imaging and thyroid uptake are most suggestive of Graves disease.  We started MMI 5 mg daily, then increased to 5 mg bid on 03/09/2016.  We also started a beta blocker, Toprol-XL 25 mg daily at last visit >> now bid.   I reviewed pt's thyroid tests: Lab Results  Component Value Date   TSH 0.13* 03/09/2016   TSH 0.27* 01/03/2016   TSH 0.16* 11/06/2015   TSH 2.66 07/02/2011   TSH 4.24 02/20/2011   TSH 2.94 01/23/2011   TSH 2.96 11/20/2010   FREET4 2.41* 03/09/2016   FREET4 1.91* 01/03/2016   FREET4 1.15 11/06/2015   FREET4 0.95 02/20/2011  11/06/2015: TSI antibodies 331 (<140%) 10/10/2015: TPO antibodies >1000 10/10/2015: TSH <0.006, total T3 3.3 (1-1.7), free T4 1.97 (0.82-1.77) - not on thyroid hormones at that time   02/20/2015: TSH 0.04, free T4 1.25  Pt still feels poorly - she describes: - + tremors - + palpitations  - + heat intolerance - + leg swelling and erythema - + fatigue - + Weight loss - + hoarseness  - + constipation - sees Dr. Olevia Perches - + dry skin, also rashes on arms - + hair loss  Pt denies feeling nodules in neck, + hoarseness, + occasional choking-not new, no dysphagia/odynophagia, no SOB with lying down.  She has + FH of thyroid disorders in: mother, M aunts, all cousins. No FH of thyroid cancer.  No h/o radiation tx to head or neck. No recent use of iodine supplements.  She has wrist gout >> was on Prednisone several years ago, no steroids recently.  ROS: Constitutional: see HPI Eyes: + blurry vision, no xerophthalmia ENT: + see HPI Cardiovascular: + all: CP/+ SOB/ +palpitations/+ leg swelling Respiratory: no cough/+ SOB Gastrointestinal: no N/V/D/+ C Musculoskeletal: no muscle/joint aches Skin: no rashes, + hair loss, + rash  Neurological:+ tremors/numbness/tingling/dizziness, + HA  I reviewed pt's medications, allergies, PMH, social hx, family hx, and changes were documented in the history of present illness. Otherwise, unchanged from my initial visit note.  Past Medical History  Diagnosis Date  . Hyperlipidemia   . Hx of adenomatous colonic polyps   . IBS (irritable bowel syndrome)   . Hemorrhoids   . Colonic inertia   .  Active smoker   . OSA (obstructive sleep apnea)     Suspected  . Obesity   . Allergic rhinitis     RAST positive, IgE 1569 from 01/23/2011  . Dyspnea     PFT 02/03/11>>FEV1 2.64(107%, FEV1% 76, TLC 5.44 (103%, DLCO 67%, no BD  . Hypothyroidism   . Vitamin B12 deficiency   . PVD (peripheral vascular disease) ([place])   . Hemorrhoids   . Lumbar radiculopathy   . Colonic inertia   . Arthritis   . COPD (chronic obstructive pulmonary disease) ([place])   . Osteoporosis   . Nasal congestion   . Trouble swallowing   . Cough   . Rash   .  Constipation   . Visual disturbance   . Hernia     lower back  . Vitamin D deficiency   . Chronic constipation 02/01/2014  . Fatty liver   . Sleep apnea     no cpap  . Allergy   . Costochondritis   . DDD (degenerative disc disease), lumbar   . Impacted cerumen of both ears   . Chronic back pain   . Plantar fasciitis   . Referred otalgia of right ear   . Hearing loss   . Tinea cruris   . Occipital neuralgia of right side 05/15/2015   Past Surgical History  Procedure Laterality Date  . Cesarean section  1980, 1988    x 2  . Nasal septum surgery  1984  . Abdominal hysterectomy  1990  . Laparoscopic appendectomy N/A 01/31/2014    Procedure: APPENDECTOMY LAPAROSCOPIC;  Surgeon: Harl Bowie, MD;  Location: [facility];  Service: General;  Laterality: N/A;  . Cardiac catheterization N/A 11/01/2015    Procedure: Right/Left Heart Cath and Coronary Angiography;  Surgeon: Larey Dresser, MD;  Location: [facility];  Service: Cardiovascular;  Laterality: N/A;  . Appendectomy  2014   Social History   Social History  . Marital Status: Married    Spouse Name: N/A  . Number of Children: 2  . Years of Education: 14   Occupational History  . unemployed   .     Social History Main Topics  . Smoking status: Light Tobacco Smoker -- 0.30 packs/day for 37 years    Types: Cigarettes  . Smokeless tobacco: Never Used  . Alcohol Use: No  . Drug Use: No  . Sexual Activity: Yes    Birth Control/ Protection: Surgical   Other Topics Concern  . Not on file   Social History Narrative   Caffeine Use - 1 cup coffee/day   Patient is right handed.         Current Outpatient Prescriptions on File Prior to Visit  Medication Sig Dispense Refill  . acetaminophen (TYLENOL) 325 MG tablet Take 2 tablets (650 mg total) by mouth every 6 (six) hours as needed for mild pain, moderate pain, fever or headache (Do not take more than 4000 mg per day at home.  it is in your prescription medicine.).     Marland Kitchen albuterol (PROVENTIL HFA;VENTOLIN HFA) 108 (90 BASE) MCG/ACT inhaler Inhale 1-2 puffs into the lungs every 4 (four) hours as needed for wheezing or shortness of breath.     Marland Kitchen aspirin 81 MG EC tablet Take 81 mg by mouth daily.     . Cholecalciferol (VITAMIN D3) 400 units CAPS Take by mouth.    . fluticasone (FLONASE) 50 MCG/ACT nasal spray Place 2 sprays into both nostrils daily.    Marland Kitchen  ibuprofen (ADVIL,MOTRIN) 200 MG tablet Take 200 mg by mouth every 6 (six) hours as needed.    . Linaclotide (LINZESS) 290 MCG CAPS capsule Take 1 capsule (290 mcg total) by mouth daily. 30 capsule 11  . Linaclotide (LINZESS) 290 MCG CAPS capsule Take 290 mcg by mouth daily.    . methimazole (TAPAZOLE) 5 MG tablet Take 1 tablet (5 mg total) by mouth daily. 30 tablet 2  . metoprolol succinate (TOPROL XL) 25 MG 24 hr tablet Take 1 tablet (25 mg total) by mouth daily. 90 tablet 1  . nitroGLYCERIN (NITROSTAT) 0.4 MG SL tablet Place 1 tablet (0.4 mg total) under the tongue every 5 (five) minutes as needed for chest pain. 90 tablet 3  . phenazopyridine (PYRIDIUM) 200 MG tablet Take 1 tablet (200 mg total) by mouth 3 (three) times daily as needed for pain. 6 tablet 0  . vitamin B-12 (CYANOCOBALAMIN) 1000 MCG tablet Take 1,000 mcg by mouth daily.     No current facility-administered medications on file prior to visit.   Allergies  Allergen Reactions  . Codeine Rash   Family History  Problem Relation Age of Onset  . Heart attack Father 48    x3  . Allergies Father   . Arthritis Father   . Heart disease Father   . Prostate cancer Brother   . Colon cancer Neg Hx   . Hypertension Mother   . Arthritis Mother   . Osteoporosis Mother   . Thyroid disease Mother    PE: BP 114/64 mmHg  Pulse 114  Temp(Src) 98.1 F (36.7 C) (Oral)  Resp 12  Wt 257 lb (116.574 kg)  SpO2 96% Body mass index is 42.1 kg/(m^2). Wt Readings from Last 3 Encounters:  04/02/16 257 lb (116.574 kg)  03/13/16 261 lb 6.4 oz (118.57 kg)   03/06/16 270 lb (122.471 kg)   Constitutional: obese, in NAD Eyes: PERRLA, EOMI, no exophthalmos, no lid lag, no stare ENT: moist mucous membranes, no thyromegaly, "lumpy-bumpy" thyroid (prominent L lobe)no cervical lymphadenopathy Cardiovascular: tachycardia, RR, No MRG Respiratory: CTA B Gastrointestinal: abdomen soft, NT, ND, BS+ Musculoskeletal: no deformities, strength intact in all 4 Skin: moist, warm, + rash ant lower legs (stasis dermatitis?) Neurological: + tremor with outstretched hands, DTR normal in all 4  ASSESSMENT: 1. Graves ds.  2. H/o Hashimoto's thyroiditis  PLAN:  1. And 2. Patient with h/o Hashimoto's hypothyroidism, off levothyroxine therapy for the last 3 years. However, approximately a year ago, she developed tachycardia, heat intolerance and tremors. Her TPO ABs were >1000, so I assumed thyrotoxic phase of Hashimoto's thyroiditis. However, as TFTs did not improve, we checked a thyroid uptake and scan test in 01/2016 >> new Dx of Graves ds.  - We started her on methimazole 5 mg once a day, however, since the tests were not improving, we increased this to 2x a day. We also started Toprol-XL 25 mg daily, and this was increased by her cardiologist to 2x a day. She is still feeling poorly, with tremors, heat intolerance, and tachycardia (pulse today is 114!). At this point, I suggested definitive treatment of her Graves' disease: RAI treatment. I explained that surgery is also an option, but this would be the last resort. She agrees with this. However, she would like to continue the methimazole for another month to see if her symptoms improve. - We will recheck her TSH, fT3 and fT4 today, and, if still abnormal, I will increase her methimazole, recheck her TFTs in another 4-5  weeks, and, if no improvement, will go ahead with RAI treatment. - she will need to return in 5 weeks for repeat labs - I will see her back in 3 months  Office Visit on 04/02/2016  Component Date  Value Ref Range Status  . Free T4 04/02/2016 1.73* 0.60 - 1.60 ng/dL Final  . T3, Free 04/02/2016 5.2* 2.3 - 4.2 pg/mL Final  . TSH 04/02/2016 0.15* 0.35 - 4.50 uIU/mL Final   TFTs improved, but still abnormal >> will increase MMI to 10 mg bid and repeat TFTs in 4-5 weeks.

## 2016-04-02 NOTE — Patient Instructions (Signed)
Please stop at the lab.  Continue Methimazole 5 mg 2x a day for now.  Continue Toprol XL 25 mg 2x a day.  Please come back for a follow-up appointment in 3 months.

## 2016-04-03 ENCOUNTER — Other Ambulatory Visit: Payer: Self-pay | Admitting: Obstetrics & Gynecology

## 2016-04-03 ENCOUNTER — Other Ambulatory Visit: Payer: Self-pay | Admitting: *Deleted

## 2016-04-03 ENCOUNTER — Encounter: Payer: Self-pay | Admitting: Internal Medicine

## 2016-04-03 DIAGNOSIS — R928 Other abnormal and inconclusive findings on diagnostic imaging of breast: Secondary | ICD-10-CM

## 2016-04-03 MED ORDER — METHIMAZOLE 5 MG PO TABS: 10.0000 mg | ORAL_TABLET | Freq: Two times a day (BID) | ORAL | Status: DC

## 2016-04-14 ENCOUNTER — Ambulatory Visit
Admission: RE | Admit: 2016-04-14 | Discharge: 2016-04-14 | Disposition: A | Discharge status: Home / Self Care | Payer: BLUE CROSS/BLUE SHIELD | Source: Ambulatory Visit | Attending: Obstetrics & Gynecology | Admitting: Obstetrics & Gynecology

## 2016-04-14 ENCOUNTER — Other Ambulatory Visit: Payer: Self-pay | Admitting: Obstetrics & Gynecology

## 2016-04-14 DIAGNOSIS — R928 Other abnormal and inconclusive findings on diagnostic imaging of breast: Secondary | ICD-10-CM

## 2016-04-14 DIAGNOSIS — N6310 Unspecified lump in the right breast, unspecified quadrant: Secondary | ICD-10-CM

## 2016-04-23 ENCOUNTER — Other Ambulatory Visit: Payer: Self-pay | Admitting: Obstetrics & Gynecology

## 2016-04-23 ENCOUNTER — Ambulatory Visit
Admission: RE | Admit: 2016-04-23 | Discharge: 2016-04-23 | Disposition: A | Discharge status: Home / Self Care | Payer: BLUE CROSS/BLUE SHIELD | Source: Ambulatory Visit | Attending: Obstetrics & Gynecology | Admitting: Obstetrics & Gynecology

## 2016-04-23 DIAGNOSIS — N6310 Unspecified lump in the right breast, unspecified quadrant: Secondary | ICD-10-CM

## 2016-04-24 ENCOUNTER — Telehealth: Payer: Self-pay | Admitting: *Deleted

## 2016-04-24 ENCOUNTER — Encounter: Payer: Self-pay | Admitting: *Deleted

## 2016-04-24 DIAGNOSIS — C50411 Malignant neoplasm of upper-outer quadrant of right female breast: Secondary | ICD-10-CM

## 2016-04-24 HISTORY — DX: Malignant neoplasm of upper-outer quadrant of right female breast: C50.411

## 2016-04-24 NOTE — Telephone Encounter (Signed)
Confirmed [facility] for 04/29/16 at 1215 .  Instructions and contact information given.

## 2016-04-29 ENCOUNTER — Encounter: Payer: Self-pay | Admitting: Hematology and Oncology

## 2016-04-29 ENCOUNTER — Encounter: Payer: Self-pay | Admitting: Nurse Practitioner

## 2016-04-29 ENCOUNTER — Other Ambulatory Visit: Payer: Self-pay | Admitting: General Surgery

## 2016-04-29 ENCOUNTER — Encounter: Payer: Self-pay | Admitting: Physical Therapy

## 2016-04-29 ENCOUNTER — Ambulatory Visit: Payer: BLUE CROSS/BLUE SHIELD | Attending: General Surgery | Admitting: Physical Therapy

## 2016-04-29 ENCOUNTER — Encounter: Payer: Self-pay | Admitting: General Practice

## 2016-04-29 ENCOUNTER — Ambulatory Visit
Admission: RE | Admit: 2016-04-29 | Discharge: 2016-04-29 | Disposition: A | Discharge status: Home / Self Care | Payer: BLUE CROSS/BLUE SHIELD | Source: Ambulatory Visit | Attending: Radiation Oncology | Admitting: Radiation Oncology

## 2016-04-29 ENCOUNTER — Other Ambulatory Visit (HOSPITAL_BASED_OUTPATIENT_CLINIC_OR_DEPARTMENT_OTHER): Payer: BLUE CROSS/BLUE SHIELD

## 2016-04-29 ENCOUNTER — Ambulatory Visit (HOSPITAL_BASED_OUTPATIENT_CLINIC_OR_DEPARTMENT_OTHER): Payer: BLUE CROSS/BLUE SHIELD | Admitting: Hematology and Oncology

## 2016-04-29 VITALS — BP 101/75 | HR 103 | Temp 98.3°F | Resp 19 | Ht 66.0 in | Wt 261.2 lb

## 2016-04-29 DIAGNOSIS — Z801 Family history of malignant neoplasm of trachea, bronchus and lung: Secondary | ICD-10-CM

## 2016-04-29 DIAGNOSIS — R293 Abnormal posture: Secondary | ICD-10-CM

## 2016-04-29 DIAGNOSIS — C50411 Malignant neoplasm of upper-outer quadrant of right female breast: Secondary | ICD-10-CM

## 2016-04-29 DIAGNOSIS — M25621 Stiffness of right elbow, not elsewhere classified: Secondary | ICD-10-CM | POA: Diagnosis present

## 2016-04-29 DIAGNOSIS — Z720 Tobacco use: Secondary | ICD-10-CM

## 2016-04-29 DIAGNOSIS — R0781 Pleurodynia: Secondary | ICD-10-CM

## 2016-04-29 LAB — CBC WITH DIFFERENTIAL/PLATELET
BASO%: 0.2 % (ref 0.0–2.0)
Basophils Absolute: 0 10*3/uL (ref 0.0–0.1)
EOS ABS: 0.2 10*3/uL (ref 0.0–0.5)
EOS%: 2.5 % (ref 0.0–7.0)
HCT: 43.7 % (ref 34.8–46.6)
HGB: 14.1 g/dL (ref 11.6–15.9)
LYMPH%: 24 % (ref 14.0–49.7)
MCH: 27.5 pg (ref 25.1–34.0)
MCHC: 32.4 g/dL (ref 31.5–36.0)
MCV: 85 fL (ref 79.5–101.0)
MONO#: 0.6 10*3/uL (ref 0.1–0.9)
MONO%: 9.4 % (ref 0.0–14.0)
NEUT%: 63.9 % (ref 38.4–76.8)
NEUTROS ABS: 4.1 10*3/uL (ref 1.5–6.5)
Platelets: 174 10*3/uL (ref 145–400)
RBC: 5.14 10*6/uL (ref 3.70–5.45)
RDW: 14.7 % — ABNORMAL HIGH (ref 11.2–14.5)
WBC: 6.4 10*3/uL (ref 3.9–10.3)
lymph#: 1.5 10*3/uL (ref 0.9–3.3)

## 2016-04-29 LAB — COMPREHENSIVE METABOLIC PANEL
ALT: 17 U/L (ref 0–55)
AST: 12 U/L (ref 5–34)
Albumin: 3.3 g/dL — ABNORMAL LOW (ref 3.5–5.0)
Alkaline Phosphatase: 108 U/L (ref 40–150)
Anion Gap: 5 mEq/L (ref 3–11)
BUN: 11.8 mg/dL (ref 7.0–26.0)
CHLORIDE: 109 meq/L (ref 98–109)
CO2: 26 meq/L (ref 22–29)
Calcium: 9.2 mg/dL (ref 8.4–10.4)
Creatinine: 0.8 mg/dL (ref 0.6–1.1)
EGFR: 83 mL/min/{1.73_m2} — AB (ref >90)
GLUCOSE: 111 mg/dL (ref 70–140)
POTASSIUM: 4.5 meq/L (ref 3.5–5.1)
SODIUM: 139 meq/L (ref 136–145)
TOTAL PROTEIN: 6.7 g/dL (ref 6.4–8.3)
Total Bilirubin: 0.34 mg/dL (ref 0.20–1.20)

## 2016-04-29 NOTE — Progress Notes (Signed)
[facility] [place] Psychosocial Distress Screening Spiritual Care  Met with Ms Pichon at [facility] to introduce [facility] team/resources, reviewing distress screen per protocol.  The patient scored a 5 on the Psychosocial Distress Thermometer which indicates moderate distress. Also assessed for distress and other psychosocial needs.   ONCBCN DISTRESS SCREENING 04/29/2016  Screening Type Initial Screening  Distress experienced in past week (1-10) 5  Emotional problem type Nervousness/Anxiety;Boredom  Physical Problem type Pain;Sleep/insomnia;Constipation/diarrhea;Swollen arms/legs  Referral to support programs Yes  Other Spiritual Care   Ms Raburn notes that this has been a stressful year because she has been having significant thyroid problems (dramatically limiting stamina, thereby creating a huge stressor) and her father just started radiation for his own ca dx. 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 She reports emotional support from parents, husband, and children (esp dtr in [place], [place]).  Follow up needed: Yes.  Plan to f/u by phone for further emotional support.  [place], [place], [facility] Pager [phone] Voicemail [phone]

## 2016-04-29 NOTE — Assessment & Plan Note (Signed)
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 After reviewing the pathology in detail, we proceeded to discuss the different treatment options between surgery, radiation, chemotherapy, antiestrogen therapies.  Recommendations: 1. Breast conserving surgery followed by 2. Oncotype DX testing to determine if chemotherapy would be of any benefit followed by 3. Adjuvant radiation therapy followed by 4. Adjuvant antiestrogen therapy  Oncotype counseling: I discussed Oncotype DX test. I explained to the patient that this is a 21 gene panel to evaluate patient tumors DNA to calculate recurrence score. This would help determine whether patient has high risk or intermediate risk or low risk breast cancer. She understands that if her tumor was found to be high risk, she would benefit from systemic chemotherapy. If low risk, no need of chemotherapy. If she was found to be intermediate risk, we would need to evaluate the score as well as other risk factors and determine if an abbreviated chemotherapy may be of benefit.  If the final size is 0.5 cm or less, she may not need Oncotype DX testing.  Return to clinic after surgery to discuss final pathology report and then determine if Oncotype DX testing will need to be sent.

## 2016-04-29 NOTE — Progress Notes (Signed)
[facility] NOTE  Patient Care Team: Delilah Shan, MD as PCP - General (Family Medicine) Malachi Pro, MD as Referring Physician (Pediatrics) Viona Gilmore Evette Cristal, MD as Consulting Physician (Obstetrics and Gynecology) Autumn Messing III, MD as Consulting Physician (General Surgery) Nicholas Lose, MD as Consulting Physician (Oncology) Gery Pray, MD as Consulting Physician (Radiation Oncology)  CHIEF COMPLAINTS/PURPOSE OF CONSULTATION:  Newly diagnosed breast cancer  HISTORY OF PRESENTING ILLNESS:  Sophia Kelley 65 y.o. female is here because of recent diagnosis of Right breast cancer. She had a screening mammogram thatdetected a distortion in the right breast at 11:00 position. It measured 0.6 cm. Axilla was negative. 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 She was presented this morning to the multidisciplinary tumor board and she is here today to discuss treatment plan. Her father is going to lung cancer chemotherapy and harm husband has end-stage diabetes with peripheral neuropathy and cannot walk. So she is a primary caregiver for both her mother and father as well as her husband. She is extremely anxious and worried about this diagnosis.  I reviewed her records extensively and collaborated the history with the patient.  SUMMARY OF ONCOLOGIC HISTORY:   Breast cancer of upper-outer quadrant of right female breast ([place])   04/23/2016 Initial Diagnosis Screening detected right breast distortion at 11:00: 0.6 x 0.5 x 0.5 cm, axilla negative, IDC, ER 80%, PR 0%, HER-2 negative, Ki-67 5%, T1 N0 stage IA clinical stage    In terms of breast cancer risk profile:  She menarched at early age of 60 and went to menopause at age 60  She had 2 pregnancy, her first child was born at age 18  She as not received birth control pills.  She was never exposed to fertility medications or  hormone replacement therapy.  She has no family history of Breast/GYN/GI cancer  MEDICAL HISTORY:  Past Medical History  Diagnosis Date  . Hyperlipidemia   . Hx of adenomatous colonic polyps   . IBS (irritable bowel syndrome)   . Hemorrhoids   . Colonic inertia   . Active smoker   . OSA (obstructive sleep apnea)     Suspected  . Obesity   . Allergic rhinitis     RAST positive, IgE 1569 from 01/23/2011  . Dyspnea     PFT 02/03/11>>FEV1 2.64(107%, FEV1% 76, TLC 5.44 (103%, DLCO 67%, no BD  . Hypothyroidism   . Vitamin B12 deficiency   . PVD (peripheral vascular disease) ([place])   . Hemorrhoids   . Lumbar radiculopathy   . Colonic inertia   . Arthritis   . COPD (chronic obstructive pulmonary disease) ([place])   . Osteoporosis   . Nasal congestion   . Trouble swallowing   . Cough   . Rash   . Constipation   . Visual disturbance   . Hernia     lower back  . Vitamin D deficiency   . Chronic constipation 02/01/2014  . Fatty liver   . Sleep apnea     no cpap  . Allergy   . Costochondritis   . DDD (degenerative disc disease), lumbar   . Impacted cerumen of both ears   . Chronic back pain   . Plantar fasciitis   . Referred otalgia of right ear   . Hearing loss   . Tinea cruris   . Occipital neuralgia of right side 05/15/2015  .  Breast cancer of upper-outer quadrant of right female breast ([place]) 04/24/2016    SURGICAL HISTORY: Past Surgical History  Procedure Laterality Date  . Cesarean section  1980, 1988    x 2  . Nasal septum surgery  1984  . Abdominal hysterectomy  1990  . Laparoscopic appendectomy N/A 01/31/2014    Procedure: APPENDECTOMY LAPAROSCOPIC;  Surgeon: Harl Bowie, MD;  Location: [facility];  Service: General;  Laterality: N/A;  . Cardiac catheterization N/A 11/01/2015    Procedure: Right/Left Heart Cath and Coronary Angiography;  Surgeon: Larey Dresser, MD;  Location: [facility];  Service: Cardiovascular;  Laterality: N/A;  . Appendectomy  2014     SOCIAL HISTORY: Social History   Social History  . Marital Status: Married    Spouse Name: N/A  . Number of Children: 2  . Years of Education: 14   Occupational History  . unemployed   .     Social History Main Topics  . Smoking status: Current Every Day Smoker -- 0.30 packs/day for 37 years    Types: Cigarettes  . Smokeless tobacco: Never Used  . Alcohol Use: No  . Drug Use: No  . Sexual Activity: Yes    Birth Control/ Protection: Surgical   Other Topics Concern  . Not on file   Social History Narrative   Caffeine Use - 1 cup coffee/day   Patient is right handed.          FAMILY HISTORY: Family History  Problem Relation Age of Onset  . Heart attack Father 48    x3  . Allergies Father   . Arthritis Father   . Heart disease Father   . Prostate cancer Brother   . Colon cancer Neg Hx   . Hypertension Mother   . Arthritis Mother   . Osteoporosis Mother   . Thyroid disease Mother     ALLERGIES:  is allergic to codeine.  MEDICATIONS:  Current Outpatient Prescriptions  Medication Sig Dispense Refill  . acetaminophen (TYLENOL) 325 MG tablet Take 2 tablets (650 mg total) by mouth every 6 (six) hours as needed for mild pain, moderate pain, fever or headache (Do not take more than 4000 mg per day at home.  it is in your prescription medicine.).    Marland Kitchen albuterol (PROVENTIL HFA;VENTOLIN HFA) 108 (90 BASE) MCG/ACT inhaler Inhale 1-2 puffs into the lungs every 4 (four) hours as needed for wheezing or shortness of breath.     . Cholecalciferol (VITAMIN D3) 400 units CAPS Take by mouth.    . fluticasone (FLONASE) 50 MCG/ACT nasal spray Place 2 sprays into both nostrils daily.    . Linaclotide (LINZESS) 290 MCG CAPS capsule Take 1 capsule (290 mcg total) by mouth daily. 30 capsule 11  . methimazole (TAPAZOLE) 5 MG tablet Take 2 tablets (10 mg total) by mouth 2 (two) times daily. 120 tablet 2  . metoprolol succinate (TOPROL XL) 25 MG 24 hr tablet Take 1 tablet (25 mg  total) by mouth daily. (Patient taking differently: Take 50 mg by mouth daily. ) 90 tablet 1  . nitroGLYCERIN (NITROSTAT) 0.4 MG SL tablet Place 1 tablet (0.4 mg total) under the tongue every 5 (five) minutes as needed for chest pain. 90 tablet 3  . phenazopyridine (PYRIDIUM) 200 MG tablet Take 1 tablet (200 mg total) by mouth 3 (three) times daily as needed for pain. 6 tablet 0  . vitamin B-12 (CYANOCOBALAMIN) 1000 MCG tablet Take 1,000 mcg by mouth daily.    Marland Kitchen  aspirin 81 MG EC tablet Take 81 mg by mouth daily. Reported on 04/29/2016    . ibuprofen (ADVIL,MOTRIN) 200 MG tablet Take 200 mg by mouth every 6 (six) hours as needed. Reported on 04/29/2016     No current facility-administered medications for this visit.    REVIEW OF SYSTEMS:   Constitutional: Denies fevers, chills or abnormal night sweats Eyes: Denies blurriness of vision, double vision or watery eyes Ears, nose, mouth, throat, and face: Denies mucositis or sore throat Respiratory: Denies cough, dyspnea or wheezes Cardiovascular: Denies palpitation, chest discomfort or lower extremity swelling Gastrointestinal:  Denies nausea, heartburn or change in bowel habits Skin: Denies abnormal skin rashes Lymphatics: Denies new lymphadenopathy or easy bruising Neurological:Denies numbness, tingling or new weaknesses Behavioral/Psych: extremely anxious and nervous Breast:  Denies any palpable lumps or discharge All other systems were reviewed with the patient and are negative.  PHYSICAL EXAMINATION: ECOG PERFORMANCE STATUS: 1 - Symptomatic but completely ambulatory  Filed Vitals:   04/29/16 1239  BP: 101/75  Pulse: 103  Temp: 98.3 F (36.8 C)  Resp: 19   Filed Weights   04/29/16 1239  Weight: 261 lb 3.2 oz (118.48 kg)    GENERAL:alert, no distress and comfortable SKIN: skin color, texture, turgor are normal, no rashes or significant lesions EYES: normal, conjunctiva are pink and non-injected, sclera clear OROPHARYNX:no  exudate, no erythema and lips, buccal mucosa, and tongue normal  NECK: supple, thyroid normal size, non-tender, without nodularity LYMPH:  no palpable lymphadenopathy in the cervical, axillary or inguinal LUNGS: clear to auscultation and percussion with normal breathing effort HEART: regular rate & rhythm and no murmurs and no lower extremity edema ABDOMEN:abdomen soft, non-tender and normal bowel sounds Musculoskeletal:no cyanosis of digits and no clubbing  PSYCH: alert & oriented x 3 with fluent speech NEURO: no focal motor/sensory deficits BREAST: No palpable nodules in breast. No palpable axillary or supraclavicular lymphadenopathy (exam performed in the presence of a chaperone)   LABORATORY DATA:  I have reviewed the data as listed Lab Results  Component Value Date   WBC 6.4 04/29/2016   HGB 14.1 04/29/2016   HCT 43.7 04/29/2016   MCV 85.0 04/29/2016   PLT 174 04/29/2016   Lab Results  Component Value Date   NA 139 04/29/2016   K 4.5 04/29/2016   CL 103 10/17/2015   CO2 26 04/29/2016    RADIOGRAPHIC STUDIES: I have personally reviewed the radiological reports and agreed with the findings in the report.  ASSESSMENT AND PLAN:  Breast cancer of upper-outer quadrant of right female breast ([place]) 04/23/16: Screening detected right breast distortion at 11:00: 0.6 x 0.5 x 0.5 cm, axilla negative, IDC, ER 80%, PR 0%, HER-2 negative, Ki-67 5%, T1 N0 stage IA clinical stage  Pathology and radiology counseling:Discussed with the patient, the details of pathology including the type of breast cancer,the clinical staging, the significance of ER, PR and HER-2/neu receptors and the implications for treatment. After reviewing the pathology in detail, we proceeded to discuss the different treatment options between surgery, radiation, chemotherapy, antiestrogen therapies.  Recommendations: 1. Breast conserving surgery followed by 2. Oncotype DX testing to determine if chemotherapy would be of  any benefit followed by 3. Adjuvant radiation therapy followed by 4. Adjuvant antiestrogen therapy  Oncotype counseling: I discussed Oncotype DX test. I explained to the patient that this is a 21 gene panel to evaluate patient tumors DNA to calculate recurrence score. This would help determine whether patient has high risk or intermediate risk  or low risk breast cancer. She understands that if her tumor was found to be high risk, she would benefit from systemic chemotherapy. If low risk, no need of chemotherapy. If she was found to be intermediate risk, we would need to evaluate the score as well as other risk factors and determine if an abbreviated chemotherapy may be of benefit.  If the final size is 0.5 cm or less, she will not need Oncotype DX testing. atient is a primary caregiver for her husband as well as her aging parents her father is going through chemotherapy for lung cancer. it would probably be extremely stressful for her to have to go through chemotherapy fit was required.  Rib pain: We will get an x-ray of the ribs. I suspect it is musculoskeletal in nature.  Return to clinic after surgery to discuss final pathology report and then determine if Oncotype DX testing will need to be sent.     All questions were answered. The patient knows to call the clinic with any problems, questions or concerns.    Rulon Eisenmenger, MD 04/29/2016

## 2016-04-29 NOTE — Therapy (Signed)
[place], [place], [address] Phone: [phone]   Fax:  [phone]  Physical Therapy Evaluation  Patient Details  Name: Sophia Kelley MRN: [id number] Date of Birth: 06-Nov-1951 Referring Provider: Dr. Autumn Messing  Encounter Date: 04/29/2016      PT End of Session - 04/29/16 1642    Visit Number 1   Number of Visits 1   PT Start Time 1430   PT Stop Time 1455   PT Time Calculation (min) 25 min   Activity Tolerance Patient tolerated treatment well   Behavior During Therapy [facility] for tasks assessed/performed;Anxious      Past Medical History  Diagnosis Date  . Hyperlipidemia   . Hx of adenomatous colonic polyps   . IBS (irritable bowel syndrome)   . Hemorrhoids   . Colonic inertia   . Active smoker   . OSA (obstructive sleep apnea)     Suspected  . Obesity   . Allergic rhinitis     RAST positive, IgE 1569 from 01/23/2011  . Dyspnea     PFT 02/03/11>>FEV1 2.64(107%, FEV1% 76, TLC 5.44 (103%, DLCO 67%, no BD  . Hypothyroidism   . Vitamin B12 deficiency   . PVD (peripheral vascular disease) ([place])   . Hemorrhoids   . Lumbar radiculopathy   . Colonic inertia   . Arthritis   . COPD (chronic obstructive pulmonary disease) ([place])   . Osteoporosis   . Nasal congestion   . Trouble swallowing   . Cough   . Rash   . Constipation   . Visual disturbance   . Hernia     lower back  . Vitamin D deficiency   . Chronic constipation 02/01/2014  . Fatty liver   . Sleep apnea     no cpap  . Allergy   . Costochondritis   . DDD (degenerative disc disease), lumbar   . Impacted cerumen of both ears   . Chronic back pain   . Plantar fasciitis   . Referred otalgia of right ear   . Hearing loss   . Tinea cruris   . Occipital neuralgia of right side 05/15/2015  . Breast cancer of upper-outer quadrant of right female breast ([place]) 04/24/2016    Past Surgical History  Procedure Laterality Date  . Cesarean  section  1980, 1988    x 2  . Nasal septum surgery  1984  . Abdominal hysterectomy  1990  . Laparoscopic appendectomy N/A 01/31/2014    Procedure: APPENDECTOMY LAPAROSCOPIC;  Surgeon: Harl Bowie, MD;  Location: [facility];  Service: General;  Laterality: N/A;  . Cardiac catheterization N/A 11/01/2015    Procedure: Right/Left Heart Cath and Coronary Angiography;  Surgeon: Larey Dresser, MD;  Location: [facility];  Service: Cardiovascular;  Laterality: N/A;  . Appendectomy  2014    There were no vitals filed for this visit.       Subjective Assessment - 04/29/16 1634    Subjective Patient reports she is here today to be seen for her newly diagnosed right breast cancer.   Pertinent History Patient was diagnosed on 03/30/16 with right invasive lobular carcinoma.  It is located in the upper outer quadrant and measures 0.6 cm.  It is ER positive, PR negative and HER2 negative with a Ki67 of 5%.  Her multidiciplinary medical team met prior to evaluating the patient to determine a recommended treatment plan.   Patient Stated Goals Reduce lymphedema risk and learn post  op shoulder ROM HEP   Currently in Pain? Yes   Pain Score 6    Pain Location Rib cage   Pain Orientation Right   Pain Descriptors / Indicators Sharp   Pain Type Chronic pain   Pain Onset More than a month ago  About 1 year   Pain Frequency Intermittent  With certain right arm movements   Aggravating Factors  Reaching   Pain Relieving Factors Resting   Multiple Pain Sites No            [facility] PT Assessment - 04/29/16 0001    Assessment   Medical Diagnosis Right breast cancer   Referring Provider Dr. Autumn Messing   Onset Date/Surgical Date 03/30/16   Hand Dominance Right   Prior Therapy none   Precautions   Precautions Other (comment)   Precaution Comments Active breast cancer, osteoporosis, elevated heart rate due to recent diagnosis of Graves disease   Restrictions   Weight Bearing Restrictions No    Balance Screen   Has the patient fallen in the past 6 months No   Has the patient had a decrease in activity level because of a fear of falling?  No   Is the patient reluctant to leave their home because of a fear of falling?  No   Home Environment   Living Environment Private residence   Living Arrangements Spouse/significant other  Has to care for her husband with severe neuropathy   Available Help at Discharge Family   Prior Function   Level of [place] Retired   Leisure She does not exercise   Cognition   Overall Cognitive Status Within Functional Limits for tasks assessed   Posture/Postural Control   Posture/Postural Control Postural limitations   Postural Limitations Rounded Shoulders;Forward head   ROM / Strength   AROM / PROM / Strength AROM;Strength   AROM   AROM Assessment Site Shoulder;Cervical   Right/Left Shoulder Right;Left   Right Shoulder Extension 45 Degrees   Right Shoulder Flexion 109 Degrees   Right Shoulder ABduction 139 Degrees   Right Shoulder Internal Rotation 45 Degrees   Right Shoulder External Rotation 68 Degrees   Left Shoulder Extension 38 Degrees   Left Shoulder Flexion 100 Degrees  with c/o pain   Left Shoulder ABduction 120 Degrees   Left Shoulder Internal Rotation 62 Degrees   Left Shoulder External Rotation 68 Degrees   Cervical Flexion WNL   Cervical Extension WNL   Cervical - Right Side Bend WNL   Cervical - Left Side Bend WNL   Cervical - Right Rotation WNL   Cervical - Left Rotation WNL   Strength   Overall Strength Within functional limits for tasks performed           LYMPHEDEMA/ONCOLOGY QUESTIONNAIRE - 04/29/16 1640    Type   Cancer Type Right breast cancer   Lymphedema Assessments   Lymphedema Assessments Upper extremities   Right Upper Extremity Lymphedema   10 cm Proximal to Olecranon Process 35.7 cm   Olecranon Process 28.7 cm   10 cm Proximal to Ulnar Styloid Process 25 cm   Just Proximal  to Ulnar Styloid Process 18 cm   Across Hand at [employer] 20.2 cm   At [place] of 2nd Digit 7 cm   Left Upper Extremity Lymphedema   10 cm Proximal to Olecranon Process 36.8 cm   Olecranon Process 28.9 cm   10 cm Proximal to Ulnar Styloid Process 24.6 cm   Just  Proximal to Ulnar Styloid Process 17.9 cm   Across Hand at [employer] 20.3 cm   At [place] of 2nd Digit 6.7 cm           Patient was instructed today in a home exercise program today for post op shoulder range of motion. These included active assist shoulder flexion in sitting, scapular retraction, wall walking with shoulder abduction, and hands behind head external rotation.  She was encouraged to do these twice a day, holding 3 seconds and repeating 5 times when permitted by her physician.                 PT Education - 04/29/16 1641    Education provided Yes   Education Details Lymphedema risk reduction and post op shoulder ROM HEP   Person(s) Educated Patient   Methods Explanation;Demonstration;Handout   Comprehension Returned demonstration;Verbalized understanding              Breast Clinic Goals - 04/29/16 1648    Patient will be able to verbalize understanding of pertinent lymphedema risk reduction practices relevant to her diagnosis specifically related to skin care.   Time 1   Period Days   Status Achieved   Patient will be able to return demonstrate and/or verbalize understanding of the post-op home exercise program related to regaining shoulder range of motion.   Time 1   Period Days   Status Achieved   Patient will be able to verbalize understanding of the importance of attending the postoperative After Breast Cancer Class for further lymphedema risk reduction education and therapeutic exercise.   Time 1   Period Days   Status Achieved              Plan - 04/29/16 1642    Clinical Impression Statement Patient was diagnosed on 03/30/16 with right invasive lobular carcinoma.   It is located in the upper outer quadrant and measures 0.6 cm.  It is ER positive, PR negative and HER2 negative with a Ki67 of 5%.  Her multidiciplinary medical team met prior to evaluating the patient to determine a recommended treatment plan.  She is planning to have a right lumpectomy and sentinel node biopsy followed by Oncotype testing if mass is > 5 mm followed by radiation and anti-estrogen therapy.  She may benefit from post op PT to regain shoulder ROM and reduce lymphedema risk.  Her poorly controlled elevated heart rate due to her recent Graves disease, her osteoporosis, and her current home situation which requires her to be the sole caretaker for her husband will impact her physical therapy plan and require close monitoring.  Her evaluation is of moderate complexity due to those factors and her situation is evolving as she is awaiting surgery.   Rehab Potential Excellent   Clinical Impairments Affecting Rehab Potential Graves disease, osteoporosis   PT Frequency One time visit  PT was recommended now to address current deficits but she declined due to feeling like she needs to take care of things and be with her husband before surgery   PT Treatment/Interventions Therapeutic exercise;Patient/family education   PT Next Visit Plan will f/u after surgery   PT Home Exercise Plan Post op shoulder ROM HEP   Consulted and Agree with Plan of Care Patient      Patient will benefit from skilled therapeutic intervention in order to improve the following deficits and impairments:  Decreased strength, Pain, Decreased knowledge of precautions, Impaired UE functional use, Decreased range of motion  Visit  Diagnosis: Carcinoma of upper-outer quadrant of right female breast ([place]) - Plan: PT plan of care cert/re-cert  Abnormal posture - Plan: PT plan of care cert/re-cert  Stiffness of right elbow, not elsewhere classified - Plan: PT plan of care cert/re-cert   Patient will follow up at outpatient  cancer rehab if needed following surgery.  If the patient requires physical therapy at that time, a specific plan will be dictated and sent to the referring physician for approval. The patient was educated today on appropriate basic range of motion exercises to begin post operatively and the importance of attending the After Breast Cancer class following surgery.  Patient was educated today on lymphedema risk reduction practices as it pertains to recommendations that will benefit the patient immediately following surgery.  She verbalized good understanding.  No additional physical therapy is indicated at this time.     Problem List Patient Active Problem List   Diagnosis Date Noted  . Breast cancer of upper-outer quadrant of right female breast ([place]) 04/24/2016  . Graves disease 04/02/2016  . Sinus tachycardia ([place]) 03/15/2016  . Hx of Hashimoto thyroiditis 01/03/2016  . Angina pectoris ([place]) 10/19/2015  . Occipital neuralgia of right side 05/15/2015  . Chronic constipation 02/01/2014  . Acute appendicitis 01/31/2014  . Other screening mammogram 07/23/2011  . Vitamin B12 deficiency 07/23/2011  . Chest pain, atypical 07/23/2011  . HYPOTHYROIDISM 02/20/2011  . Sleep apnea 02/20/2011  . ALLERGIC RHINITIS 02/03/2011  . LOW BACK PAIN, CHRONIC 01/02/2011  . Morbid obesity ([place]) 11/20/2010  . TOBACCO USE 11/20/2010  . PVD 11/20/2010  . Hyperlipidemia 10/29/2009  . CONSTIPATION, CHRONIC 10/29/2009  . IRRITABLE BOWEL SYNDROME 10/29/2009    Annia Friendly, PT 04/29/2016 4:51 PM  [place], [place], [address] Phone: [phone]   Fax:  [phone]  Name: KURSTYN LARIOS MRN: [id number] Date of Birth: 02-06-1951

## 2016-04-29 NOTE — Progress Notes (Signed)
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   The encounter diagnosis was Breast cancer of upper-outer quadrant of right female breast ([place]).  HISTORY OF PRESENT ILLNESS::Sophia Kelley is a 65 y.o. female who presented for routine screening mammogram / tomosynthesis 04/14/2016 where a right breast distortion was detected. Ultrasound showed a 0.6 x 0.5 x 0.5 cm irregular shadowing mass at the 11 o'clock position 2-3 cm from the nipple. Biopsy on 04/23/2016 revealed invasive mammary carcinoma of the right breast. This was Grade 1, ER +, PR (-), HER-2 (-), Ki-67 5%.   The patient presents to breast clinic today to discuss the role of radiation in the management of her disease.  PREVIOUS RADIATION THERAPY: No  PAST MEDICAL HISTORY:  has a past medical history of Hyperlipidemia; adenomatous colonic polyps; IBS (irritable bowel syndrome); Hemorrhoids; Colonic inertia; Active smoker; OSA (obstructive sleep apnea); Obesity; Allergic rhinitis; Dyspnea; Hypothyroidism; Vitamin B12 deficiency; PVD (peripheral vascular disease) ([place]); Hemorrhoids; Lumbar radiculopathy; Colonic inertia; Arthritis; COPD (chronic obstructive pulmonary disease) ([place]); Osteoporosis; Nasal congestion; Trouble swallowing; Cough; Rash; Constipation; Visual disturbance; Hernia; Vitamin D deficiency; Chronic constipation (02/01/2014); Fatty liver; Sleep apnea; Allergy; Costochondritis; DDD (degenerative disc disease), lumbar; Impacted cerumen of both ears; Chronic back pain; Plantar fasciitis; Referred otalgia of right ear; Hearing loss; Tinea cruris; Occipital neuralgia of right side (05/15/2015); and Breast cancer of upper-outer quadrant of right  female breast ([place]) (04/24/2016).    PAST SURGICAL HISTORY: Past Surgical History  Procedure Laterality Date  . Cesarean section  1980, 1988    x 2  . Nasal septum surgery  1984  . Abdominal hysterectomy  1990  . Laparoscopic appendectomy N/A 01/31/2014    Procedure: APPENDECTOMY LAPAROSCOPIC;  Surgeon: Harl Bowie, MD;  Location: [facility];  Service: General;  Laterality: N/A;  . Cardiac catheterization N/A 11/01/2015    Procedure: Right/Left Heart Cath and Coronary Angiography;  Surgeon: Larey Dresser, MD;  Location: [facility];  Service: Cardiovascular;  Laterality: N/A;  . Appendectomy  2014    FAMILY HISTORY: family history includes Allergies in her father; Arthritis in her father and mother; Heart attack (age of onset: 59) in her father; Heart disease in her father; Hypertension in her mother; Osteoporosis in her mother; Prostate cancer in her brother; Thyroid disease in her mother. There is no history of Colon cancer.   Father is currently undergoing radiation treatment for lung cancer at our institution.  SOCIAL HISTORY:  reports that she has been smoking Cigarettes.  She has a 11.1 pack-year smoking history. She has never used smokeless tobacco. She reports that she does not drink alcohol or use illicit drugs.  ALLERGIES: Codeine  MEDICATIONS:  Current Outpatient Prescriptions  Medication Sig Dispense Refill  . acetaminophen (TYLENOL) 325 MG tablet Take 2 tablets (650 mg total) by mouth every 6 (six) hours as needed for mild pain, moderate pain, fever or headache (Do not take more than 4000 mg per day at home.  it is in your prescription medicine.).    Marland Kitchen albuterol (PROVENTIL HFA;VENTOLIN HFA) 108 (90 BASE) MCG/ACT inhaler Inhale 1-2 puffs into the lungs every 4 (four) hours as needed for wheezing or shortness of breath.     Marland Kitchen  aspirin 81 MG EC tablet Take 81 mg by mouth daily. Reported on 04/29/2016    . Cholecalciferol (VITAMIN D3) 400 units CAPS Take by mouth.    .  fluticasone (FLONASE) 50 MCG/ACT nasal spray Place 2 sprays into both nostrils daily.    Marland Kitchen ibuprofen (ADVIL,MOTRIN) 200 MG tablet Take 200 mg by mouth every 6 (six) hours as needed. Reported on 04/29/2016    . Linaclotide (LINZESS) 290 MCG CAPS capsule Take 1 capsule (290 mcg total) by mouth daily. 30 capsule 11  . methimazole (TAPAZOLE) 5 MG tablet Take 2 tablets (10 mg total) by mouth 2 (two) times daily. 120 tablet 2  . metoprolol succinate (TOPROL XL) 25 MG 24 hr tablet Take 1 tablet (25 mg total) by mouth daily. (Patient taking differently: Take 50 mg by mouth daily. ) 90 tablet 1  . nitroGLYCERIN (NITROSTAT) 0.4 MG SL tablet Place 1 tablet (0.4 mg total) under the tongue every 5 (five) minutes as needed for chest pain. 90 tablet 3  . phenazopyridine (PYRIDIUM) 200 MG tablet Take 1 tablet (200 mg total) by mouth 3 (three) times daily as needed for pain. 6 tablet 0  . vitamin B-12 (CYANOCOBALAMIN) 1000 MCG tablet Take 1,000 mcg by mouth daily.     No current facility-administered medications for this encounter.   Gynecologic History  Age at first menstrual period? 12  Are you still having periods? No Approximate date of last period? N/A  If you are still having periods: Are your periods regular? N/A  If you no longer have periods: Have you used hormone replacement? No  If YES, for how long? N/A When did you stop? N/A Obstetric History:  How many children have you carried to term? 2 Your age at first live birth? 41  Pregnant now or trying to get pregnant? No  Have you used birth control pills or hormone shots for contraception? No  If so, for how long (or approximate dates)? N/A  Would you be interested in learning more about the options to preserve fertility? N/A Health Maintenance:  Have you ever had a colonoscopy? Yes If yes, date? Maurene Capes - 1.5 ago  Have you ever had a bone density? N/A If yes, date? Dr. Milta Deiters = 5 years  Date of your last PAP smear? Recent Date of your FIRST mammogram?  At age 85   REVIEW OF SYSTEMS:  A 15 point review of systems is documented in the electronic medical record. This was obtained by the nursing staff. However, I reviewed this with the patient to discuss relevant findings and make appropriate changes.  No pain in breast, nipple discharge of bleeding prior to diagnosis   65 year-old woman. Unaccompanied. Reports a little soreness since her biopsy. She has no other complaints at this time.   PHYSICAL EXAM:  Vitals with BMI 04/29/2016  Height 5' 6"  Weight 261 lbs 3 oz  BMI 55.3  Systolic 748  Diastolic 75  Pulse 270  Respirations 19     No palpable subclavicular or axillary adenopathy. The lungs are clear to auscultation. The heart has a regular rhythm and rate. The abdomen is soft and nontender with normal bowel sounds.  Left breast exam shows large pendulous breasts without nipple discharge or bleeding. Right breast shows biopsy site and steri strips in the 10 o'clock position of the breast adjacent to the nipple areolar complex. No dominant mass appreciated in the breast, nipple discharge or bleeding. The right breast is also large and pendulous.  ECOG = 1  LABORATORY DATA:  Lab Results  Component Value Date   WBC 6.4 04/29/2016   HGB 14.1 04/29/2016   HCT 43.7 04/29/2016   MCV 85.0 04/29/2016   PLT 174 04/29/2016   NEUTROABS 4.1 04/29/2016   Lab Results  Component Value Date   NA 139 04/29/2016   K 4.5 04/29/2016   CL 103 10/17/2015   CO2 26 04/29/2016   GLUCOSE 111 04/29/2016   CREATININE 0.8 04/29/2016   CALCIUM 9.2 04/29/2016      RADIOGRAPHY: Mm Digital Diagnostic Unilat R  04/23/2016  CLINICAL DATA:  Status post ultrasound-guided core biopsy of mass in the 1 o'clock location of the right breast. EXAM: DIAGNOSTIC RIGHT MAMMOGRAM POST ULTRASOUND BIOPSY COMPARISON:  Previous exam(s). FINDINGS: Mammographic images were obtained following ultrasound guided biopsy of mass in the 11 o'clock location of the right breast. A  ribbon shaped clip is identified in the upper-outer quadrant of the right breast as expected. IMPRESSION: Tissue marker clip is in expected location after biopsy. Final Assessment: Post Procedure Mammograms for Marker Placement Electronically Signed   By: Nolon Nations M.D.   On: 04/23/2016 15:07   US Breast Ltd Uni Right Inc Axilla  04/14/2016  CLINICAL DATA:  Screening recall for possible right breast distortion. EXAM: 2D DIGITAL DIAGNOSTIC UNILATERAL RIGHT MAMMOGRAM WITH CAD AND ADJUNCT TOMO RIGHT BREAST ULTRASOUND COMPARISON:  Previous exam(s). ACR Breast Density Category b: There are scattered areas of fibroglandular density. FINDINGS: Spot compression CC and MLO tomograms were performed of the right breast demonstrating a persistent area of distortion with the suggestion of a small central mass in the retroareolar to slightly outer right breast. Mammographic images were processed with CAD. Physical examination of the slightly outer right breast does not reveal any palpable masses. Targeted ultrasound of the right breast was performed demonstrating an irregular shadowing mass at 11 o'clock 2-3 cm from the nipple measuring 0.5 x 0.5 x 0.6 cm. This corresponds with mammography findings. No lymphadenopathy seen in the right axilla. IMPRESSION: Suspicious right breast mass. RECOMMENDATION: Ultrasound-guided biopsy of the suspicious mass in the right breast is recommended. This is scheduled for 04/20/2016 at 9 a.m. I have discussed the findings and recommendations with the patient. Results were also provided in writing at the conclusion of the visit. If applicable, a reminder letter will be sent to the patient regarding the next appointment. BI-RADS CATEGORY  4: Suspicious. Electronically Signed   By: Everlean Alstrom M.D.   On: 04/14/2016 15:10   Mm Diag Breast Tomo Uni Right  04/14/2016  CLINICAL DATA:  Screening recall for possible right breast distortion. EXAM: 2D DIGITAL DIAGNOSTIC UNILATERAL RIGHT  MAMMOGRAM WITH CAD AND ADJUNCT TOMO RIGHT BREAST ULTRASOUND COMPARISON:  Previous exam(s). ACR Breast Density Category b: There are scattered areas of fibroglandular density. FINDINGS: Spot compression CC and MLO tomograms were performed of the right breast demonstrating a persistent area of distortion with the suggestion of a small central mass in the retroareolar to slightly outer right breast. Mammographic images were processed with CAD. Physical examination of the slightly outer right breast does not reveal any palpable masses. Targeted ultrasound of the right breast was performed demonstrating an irregular shadowing mass at 11 o'clock 2-3 cm from the nipple measuring 0.5 x 0.5 x 0.6 cm. This corresponds with mammography findings. No lymphadenopathy seen in the right axilla. IMPRESSION: Suspicious right breast mass. RECOMMENDATION: Ultrasound-guided biopsy of the suspicious mass in the right breast is recommended. This is scheduled for 04/20/2016  at 9 a.m. I have discussed the findings and recommendations with the patient. Results were also provided in writing at the conclusion of the visit. If applicable, a reminder letter will be sent to the patient regarding the next appointment. BI-RADS CATEGORY  4: Suspicious. Electronically Signed   By: Everlean Alstrom M.D.   On: 04/14/2016 15:10   [place] Rt Breast Bx W Loc Dev 1st Lesion Img Bx Spec US Guide  04/24/2016  ADDENDUM REPORT: 04/24/2016 12:17 ADDENDUM: Pathology revealed GRADE I INVASIVE MAMMARY CARCINOMA of the Right breast at the 11:00 o'clock location. This was found to be concordant by Dr. Nolon Nations. Pathology results were discussed with the patient by telephone. The patient reported doing well after the biopsy with tenderness at the site. Post biopsy instructions and care were reviewed and questions were answered. The patient was encouraged to call [facility] for any additional concerns. The patient was referred to [facility] on Apr 29, 2016. Pathology results reported by Terie Purser, RN on 04/24/2016. Electronically Signed   By: Nolon Nations M.D.   On: 04/24/2016 12:17  04/24/2016  CLINICAL DATA:  Patient presents for ultrasound-guided core biopsy of mass in the right breast 11 o'clock location. EXAM: ULTRASOUND GUIDED RIGHT BREAST CORE NEEDLE BIOPSY COMPARISON:  Previous exam(s). FINDINGS: I met with the patient and we discussed the procedure of ultrasound-guided biopsy, including benefits and alternatives. We discussed the high likelihood of a successful procedure. We discussed the risks of the procedure, including infection, bleeding, tissue injury, clip migration, and inadequate sampling. Informed written consent was given. The usual time-out protocol was performed immediately prior to the procedure. Using sterile technique and 1% Lidocaine as local anesthetic, under direct ultrasound visualization, a 12 gauge spring-loaded device was used to perform biopsy of mass in the 11 o'clock location of the right breast using a lateral approach. At the conclusion of the procedure a ribbon shaped tissue marker clip was deployed into the biopsy cavity. Follow up 2 view mammogram was performed and dictated separately. IMPRESSION: Ultrasound guided biopsy of right breast mass. No apparent complications. Electronically Signed: By: Nolon Nations M.D. On: 04/23/2016 15:06      IMPRESSION: Clinical stage I invasive ductal carcinoma of the right breast. Patient would appear to be a good candidate for breast conservation therapy.  I spoke to the patient today regarding her diagnosis and options for treatment. We discussed the equivalence in terms of survival and local failure between mastectomy and breast conservation. We discussed the role of radiation in decreasing local failures in patients who undergo lumpectomy. We discussed the process of simulation  and the placement tattoos. We discussed 4-6 weeks of treatment as an outpatient. We discussed the possibility of asymptomatic lung damage. We discussed the low likelihood of secondary malignancies. We discussed the possible side effects including but not limited to skin redness, fatigue, permanent skin darkening, and breast swelling.    PLAN: The paitent will proceed with lumpectomy and sentinel node procedure. She will be seen in the post operative setting for further discussion of radiation therapy.    ------------------------------------------------  Blair Promise, PhD, MD     This document serves as a record of services personally performed by Gery Pray, MD. It was created on his behalf by Arlyce Harman, a trained medical scribe. The creation of this record is based on the scribe's personal observations and the provider's statements to them.  This document has been checked and approved by the attending provider.

## 2016-04-29 NOTE — Patient Instructions (Signed)

## 2016-04-30 ENCOUNTER — Other Ambulatory Visit: Payer: Self-pay | Admitting: Internal Medicine

## 2016-04-30 NOTE — Progress Notes (Signed)
Ms. Yakel is a very pleasant 65 y.o. female from [place], [place] with newly diagnosed grade 1 invasive mammary carcinoma of the right breast.  Biopsy results revealed the tumor's prognostic profile is ER positive, PR negative, and HER2/neu negative. Ki67 is 5%.  She presents today to the [facility]) for treatment consideration and recommendations from the breast surgeon, radiation oncologist, and medical oncologist.     I briefly met with Ms. Chicas during her [facility] visit today. We discussed the purpose of the Survivorship Clinic, which will include monitoring for recurrence, coordinating completion of age and gender-appropriate cancer screenings, promotion of overall wellness, as well as managing potential late/long-term side effects of anti-cancer treatments.    The treatment plan for Ms. Widmann will likely include surgery, radiation therapy, and anti-estrogen therapy.  As of today, the intent of treatment for Ms. Brougham is cure, therefore she will be eligible for the Survivorship Clinic upon her completion of treatment.  Her survivorship care plan (SCP) document will be drafted and updated throughout the course of her treatment trajectory. She will receive the SCP in an office visit with myself in the Survivorship Clinic once she has completed treatment.   Ms. Unrein was encouraged to ask questions and all questions were answered to her satisfaction.  She was given my business card and encouraged to contact me with any concerns regarding survivorship.  I look forward to participating in her care.   Kenn File, [place] [phone]

## 2016-04-30 NOTE — Telephone Encounter (Signed)
PT needs Methimazole called into [facility] in [place], PT states that Dr, Cruzita Lederer was going to call in an Rx for 1 year and that she has no refills left.

## 2016-04-30 NOTE — Telephone Encounter (Signed)
Pt was advised that she needed labs after 4-5 weeks. Pt understood. Pt will need labs before she can have a longer dose.

## 2016-05-04 ENCOUNTER — Other Ambulatory Visit: Payer: Self-pay | Admitting: General Surgery

## 2016-05-04 DIAGNOSIS — C50411 Malignant neoplasm of upper-outer quadrant of right female breast: Secondary | ICD-10-CM

## 2016-05-05 ENCOUNTER — Telehealth: Payer: Self-pay | Admitting: *Deleted

## 2016-05-05 NOTE — Telephone Encounter (Signed)
  Oncology Nurse Navigator Documentation    Navigator Encounter Type: Telephone;MDC Follow-up (Left vm for pt to return call regarding [place] ) (05/05/16 1200) Telephone: Outgoing Call;Clinic/MDC Follow-up (05/05/16 1200)     Surgery Date: 05/15/16 (05/05/16 1200) Treatment Initiated Date: 05/15/16 (05/05/16 1200)         Interventions: Coordination of Care (POF placed for post op appt with Dr. Lindi Adie) (05/05/16 1200)                      Time Spent with Patient: 15 (05/05/16 1200)

## 2016-05-06 ENCOUNTER — Telehealth: Payer: Self-pay | Admitting: Hematology and Oncology

## 2016-05-06 NOTE — Telephone Encounter (Signed)
Left message to inform patient of appt 6/12 at 10 am

## 2016-05-07 ENCOUNTER — Other Ambulatory Visit: Payer: BLUE CROSS/BLUE SHIELD

## 2016-05-08 ENCOUNTER — Encounter (HOSPITAL_BASED_OUTPATIENT_CLINIC_OR_DEPARTMENT_OTHER): Payer: Self-pay | Admitting: *Deleted

## 2016-05-08 DIAGNOSIS — R0981 Nasal congestion: Secondary | ICD-10-CM

## 2016-05-08 HISTORY — DX: Nasal congestion: R09.81

## 2016-05-13 ENCOUNTER — Ambulatory Visit: Payer: BLUE CROSS/BLUE SHIELD | Admitting: Cardiology

## 2016-05-14 ENCOUNTER — Ambulatory Visit
Admission: RE | Admit: 2016-05-14 | Discharge: 2016-05-14 | Disposition: A | Discharge status: Home / Self Care | Payer: BLUE CROSS/BLUE SHIELD | Source: Ambulatory Visit | Attending: General Surgery | Admitting: General Surgery

## 2016-05-14 DIAGNOSIS — C50411 Malignant neoplasm of upper-outer quadrant of right female breast: Secondary | ICD-10-CM

## 2016-05-14 NOTE — Progress Notes (Signed)
Pt given 8oz of wildberry boost to drink at 430 am morning of surgery. Instructed not to drink any other liquid after midnight except sip of water with am meds .  Pt verbalized understanding also given written instructions,  Used Teach back

## 2016-05-15 ENCOUNTER — Ambulatory Visit (HOSPITAL_BASED_OUTPATIENT_CLINIC_OR_DEPARTMENT_OTHER): Payer: BLUE CROSS/BLUE SHIELD | Admitting: Anesthesiology

## 2016-05-15 ENCOUNTER — Ambulatory Visit
Admission: RE | Admit: 2016-05-15 | Discharge: 2016-05-15 | Disposition: A | Discharge status: Home / Self Care | Payer: BLUE CROSS/BLUE SHIELD | Source: Ambulatory Visit | Attending: General Surgery | Admitting: General Surgery

## 2016-05-15 ENCOUNTER — Encounter (HOSPITAL_BASED_OUTPATIENT_CLINIC_OR_DEPARTMENT_OTHER): Payer: Self-pay | Admitting: *Deleted

## 2016-05-15 ENCOUNTER — Encounter (HOSPITAL_BASED_OUTPATIENT_CLINIC_OR_DEPARTMENT_OTHER)
Admission: RE | Disposition: A | Discharge status: Home / Self Care | Payer: Self-pay | Source: Ambulatory Visit | Attending: General Surgery

## 2016-05-15 ENCOUNTER — Ambulatory Visit (HOSPITAL_COMMUNITY)
Admission: RE | Admit: 2016-05-15 | Discharge: 2016-05-15 | Disposition: A | Discharge status: Home / Self Care | Payer: BLUE CROSS/BLUE SHIELD | Source: Ambulatory Visit | Attending: General Surgery | Admitting: General Surgery

## 2016-05-15 ENCOUNTER — Ambulatory Visit (HOSPITAL_BASED_OUTPATIENT_CLINIC_OR_DEPARTMENT_OTHER)
Admission: RE | Admit: 2016-05-15 | Discharge: 2016-05-15 | Disposition: A | Discharge status: Home / Self Care | Payer: BLUE CROSS/BLUE SHIELD | Source: Ambulatory Visit | Attending: General Surgery | Admitting: General Surgery

## 2016-05-15 DIAGNOSIS — J449 Chronic obstructive pulmonary disease, unspecified: Secondary | ICD-10-CM | POA: Insufficient documentation

## 2016-05-15 DIAGNOSIS — Z6841 Body Mass Index (BMI) 40.0 and over, adult: Secondary | ICD-10-CM | POA: Diagnosis not present

## 2016-05-15 DIAGNOSIS — F17200 Nicotine dependence, unspecified, uncomplicated: Secondary | ICD-10-CM | POA: Diagnosis not present

## 2016-05-15 DIAGNOSIS — C50411 Malignant neoplasm of upper-outer quadrant of right female breast: Secondary | ICD-10-CM

## 2016-05-15 DIAGNOSIS — E039 Hypothyroidism, unspecified: Secondary | ICD-10-CM | POA: Diagnosis not present

## 2016-05-15 HISTORY — DX: Tachycardia, unspecified: R00.0

## 2016-05-15 HISTORY — DX: Thyrotoxicosis with diffuse goiter without thyrotoxic crisis or storm: E05.00

## 2016-05-15 HISTORY — DX: Adverse effect of unspecified anesthetic, initial encounter: T41.45XA

## 2016-05-15 HISTORY — DX: Other complications of anesthesia, initial encounter: T88.59XA

## 2016-05-15 HISTORY — PX: BREAST LUMPECTOMY WITH RADIOACTIVE SEED AND SENTINEL LYMPH NODE BIOPSY: SHX6550

## 2016-05-15 HISTORY — DX: Unspecified cataract: H26.9

## 2016-05-15 HISTORY — DX: Other intervertebral disc displacement, lumbar region: M51.26

## 2016-05-15 HISTORY — DX: Constipation, unspecified: K59.00

## 2016-05-15 HISTORY — DX: Unspecified disorder of nose and nasal sinuses: J34.9

## 2016-05-15 HISTORY — DX: Reserved for inherently not codable concepts without codable children: IMO0001

## 2016-05-15 SURGERY — BREAST LUMPECTOMY WITH RADIOACTIVE SEED AND SENTINEL LYMPH NODE BIOPSY
Anesthesia: General | Site: Breast | Laterality: Right

## 2016-05-15 MED ORDER — LIDOCAINE 2% (20 MG/ML) 5 ML SYRINGE
INTRAMUSCULAR | Status: AC
  Filled 2016-05-15: qty 5

## 2016-05-15 MED ORDER — ONDANSETRON HCL 4 MG/2ML IJ SOLN
INTRAMUSCULAR | Status: AC
  Filled 2016-05-15: qty 2

## 2016-05-15 MED ORDER — HYDROMORPHONE HCL 1 MG/ML IJ SOLN
0.2500 mg | INTRAMUSCULAR | Status: DC | PRN
  Administered 2016-05-15 (×2): 0.25 mg via INTRAVENOUS
  Administered 2016-05-15: 0.5 mg via INTRAVENOUS

## 2016-05-15 MED ORDER — EPHEDRINE SULFATE 50 MG/ML IJ SOLN: INTRAMUSCULAR | Status: DC | PRN

## 2016-05-15 MED ORDER — FENTANYL CITRATE (PF) 100 MCG/2ML IJ SOLN
50.0000 ug | INTRAMUSCULAR | Status: AC | PRN
  Administered 2016-05-15 (×4): 50 ug via INTRAVENOUS

## 2016-05-15 MED ORDER — PROMETHAZINE HCL 25 MG/ML IJ SOLN: 6.2500 mg | INTRAMUSCULAR | Status: DC | PRN

## 2016-05-15 MED ORDER — FENTANYL CITRATE (PF) 100 MCG/2ML IJ SOLN
25.0000 ug | Freq: Once | INTRAMUSCULAR | Status: AC
  Administered 2016-05-15: 50 ug via INTRAVENOUS

## 2016-05-15 MED ORDER — CHLORHEXIDINE GLUCONATE 4 % EX LIQD: 1.0000 "application " | Freq: Once | CUTANEOUS | Status: DC

## 2016-05-15 MED ORDER — SCOPOLAMINE 1 MG/3DAYS TD PT72: 1.0000 | MEDICATED_PATCH | Freq: Once | TRANSDERMAL | Status: DC | PRN

## 2016-05-15 MED ORDER — PROPOFOL 10 MG/ML IV BOLUS
INTRAVENOUS | Status: DC | PRN
  Administered 2016-05-15: 200 mg via INTRAVENOUS

## 2016-05-15 MED ORDER — EPHEDRINE 5 MG/ML INJ
INTRAVENOUS | Status: AC
  Filled 2016-05-15: qty 10

## 2016-05-15 MED ORDER — FENTANYL CITRATE (PF) 100 MCG/2ML IJ SOLN
INTRAMUSCULAR | Status: AC
  Filled 2016-05-15: qty 2

## 2016-05-15 MED ORDER — DEXAMETHASONE SODIUM PHOSPHATE 10 MG/ML IJ SOLN
INTRAMUSCULAR | Status: AC
  Filled 2016-05-15: qty 1

## 2016-05-15 MED ORDER — BUPIVACAINE HCL (PF) 0.25 % IJ SOLN
INTRAMUSCULAR | Status: DC | PRN
  Administered 2016-05-15: 30 mL

## 2016-05-15 MED ORDER — MIDAZOLAM HCL 2 MG/2ML IJ SOLN
INTRAMUSCULAR | Status: AC
  Filled 2016-05-15: qty 2

## 2016-05-15 MED ORDER — CEFAZOLIN SODIUM-DEXTROSE 2-4 GM/100ML-% IV SOLN
INTRAVENOUS | Status: AC
  Filled 2016-05-15: qty 100

## 2016-05-15 MED ORDER — DEXAMETHASONE SODIUM PHOSPHATE 4 MG/ML IJ SOLN
INTRAMUSCULAR | Status: DC | PRN
  Administered 2016-05-15: 10 mg via INTRAVENOUS

## 2016-05-15 MED ORDER — ONDANSETRON HCL 4 MG/2ML IJ SOLN
4.0000 mg | Freq: Once | INTRAMUSCULAR | Status: AC
  Administered 2016-05-15: 4 mg via INTRAVENOUS

## 2016-05-15 MED ORDER — LACTATED RINGERS IV SOLN
INTRAVENOUS | Status: DC
  Administered 2016-05-15 (×2): via INTRAVENOUS

## 2016-05-15 MED ORDER — TECHNETIUM TC 99M SULFUR COLLOID FILTERED
1.0000 | Freq: Once | INTRAVENOUS | Status: AC | PRN
  Administered 2016-05-15: 1 via INTRADERMAL

## 2016-05-15 MED ORDER — ONDANSETRON HCL 4 MG/2ML IJ SOLN
INTRAMUSCULAR | Status: DC | PRN
  Administered 2016-05-15: 4 mg via INTRAVENOUS

## 2016-05-15 MED ORDER — FENTANYL CITRATE (PF) 100 MCG/2ML IJ SOLN
25.0000 ug | Freq: Once | INTRAMUSCULAR | Status: AC
  Administered 2016-05-15: 25 ug via INTRAVENOUS

## 2016-05-15 MED ORDER — BUPIVACAINE HCL (PF) 0.5 % IJ SOLN
INTRAMUSCULAR | Status: DC | PRN
  Administered 2016-05-15: 30 mL via PERINEURAL

## 2016-05-15 MED ORDER — FENTANYL CITRATE (PF) 100 MCG/2ML IJ SOLN
25.0000 ug | INTRAMUSCULAR | Status: DC | PRN
  Administered 2016-05-15: 25 ug via INTRAVENOUS

## 2016-05-15 MED ORDER — HYDROMORPHONE HCL 1 MG/ML IJ SOLN
INTRAMUSCULAR | Status: AC
  Filled 2016-05-15: qty 1

## 2016-05-15 MED ORDER — HYDROCODONE-ACETAMINOPHEN 5-325 MG PO TABS: 1.0000 | ORAL_TABLET | Freq: Four times a day (QID) | ORAL | Status: DC | PRN

## 2016-05-15 MED ORDER — LIDOCAINE HCL (CARDIAC) 20 MG/ML IV SOLN
INTRAVENOUS | Status: DC | PRN
  Administered 2016-05-15: 30 mg via INTRAVENOUS

## 2016-05-15 MED ORDER — TRAMADOL HCL 50 MG PO TABS: 50.0000 mg | ORAL_TABLET | Freq: Four times a day (QID) | ORAL | Status: DC | PRN

## 2016-05-15 MED ORDER — PHENYLEPHRINE HCL 10 MG/ML IJ SOLN
INTRAMUSCULAR | Status: DC | PRN
  Administered 2016-05-15: 40 ug via INTRAVENOUS
  Administered 2016-05-15: 80 ug via INTRAVENOUS

## 2016-05-15 MED ORDER — PROPOFOL 500 MG/50ML IV EMUL
INTRAVENOUS | Status: AC
  Filled 2016-05-15: qty 50

## 2016-05-15 MED ORDER — MIDAZOLAM HCL 2 MG/2ML IJ SOLN
1.0000 mg | INTRAMUSCULAR | Status: DC | PRN
  Administered 2016-05-15: 1 mg via INTRAVENOUS
  Administered 2016-05-15: 2 mg via INTRAVENOUS

## 2016-05-15 MED ORDER — GLYCOPYRROLATE 0.2 MG/ML IJ SOLN: 0.2000 mg | Freq: Once | INTRAMUSCULAR | Status: DC | PRN

## 2016-05-15 MED ORDER — CEFAZOLIN SODIUM-DEXTROSE 2-4 GM/100ML-% IV SOLN
2.0000 g | INTRAVENOUS | Status: AC
  Administered 2016-05-15: 2 g via INTRAVENOUS

## 2016-05-15 SURGICAL SUPPLY — 47 items
APPLIER CLIP 9.375 MED OPEN (MISCELLANEOUS) ×3
APR CLP MED 9.3 20 MLT OPN (MISCELLANEOUS) ×1
BLADE SURG 15 STRL LF DISP TIS (BLADE) ×1 IMPLANT
BLADE SURG 15 STRL SS (BLADE) ×3
CANISTER SUC SOCK COL 7IN (MISCELLANEOUS) IMPLANT
CANISTER SUCT 1200ML W/VALVE (MISCELLANEOUS) IMPLANT
CHLORAPREP W/TINT 26ML (MISCELLANEOUS) ×3 IMPLANT
CLIP APPLIE 9.375 MED OPEN (MISCELLANEOUS) ×1 IMPLANT
COVER BACK TABLE 60X90IN (DRAPES) ×3 IMPLANT
COVER MAYO STAND STRL (DRAPES) ×3 IMPLANT
COVER PROBE W GEL 5X96 (DRAPES) ×3 IMPLANT
DECANTER SPIKE VIAL GLASS SM (MISCELLANEOUS) IMPLANT
DEVICE DUBIN W/COMP PLATE 8390 (MISCELLANEOUS) ×3 IMPLANT
DRAPE LAPAROSCOPIC ABDOMINAL (DRAPES) ×3 IMPLANT
DRAPE UTILITY XL STRL (DRAPES) ×3 IMPLANT
ELECT COATED BLADE 2.86 ST (ELECTRODE) ×3 IMPLANT
ELECT REM PT RETURN 9FT ADLT (ELECTROSURGICAL) ×3
ELECTRODE REM PT RTRN 9FT ADLT (ELECTROSURGICAL) ×1 IMPLANT
GLOVE BIO SURGEON STRL SZ7.5 (GLOVE) ×3 IMPLANT
GLOVE BIOGEL PI IND STRL 7.0 (GLOVE) IMPLANT
GLOVE BIOGEL PI INDICATOR 7.0 (GLOVE) ×6
GLOVE ECLIPSE 6.5 STRL STRAW (GLOVE) ×2 IMPLANT
GLOVE SURG SS PI 7.0 STRL IVOR (GLOVE) ×2 IMPLANT
GOWN STRL REUS W/ TWL LRG LVL3 (GOWN DISPOSABLE) ×2 IMPLANT
GOWN STRL REUS W/TWL LRG LVL3 (GOWN DISPOSABLE) ×9
ILLUMINATOR WAVEGUIDE N/F (MISCELLANEOUS) IMPLANT
KIT MARKER MARGIN INK (KITS) ×3 IMPLANT
LIGHT WAVEGUIDE WIDE FLAT (MISCELLANEOUS) IMPLANT
LIQUID BAND (GAUZE/BANDAGES/DRESSINGS) ×3 IMPLANT
NDL HYPO 25X1 1.5 SAFETY (NEEDLE) ×1 IMPLANT
NDL SAFETY ECLIPSE 18X1.5 (NEEDLE) IMPLANT
NEEDLE HYPO 18GX1.5 SHARP (NEEDLE)
NEEDLE HYPO 25X1 1.5 SAFETY (NEEDLE) ×3 IMPLANT
NS IRRIG 1000ML POUR BTL (IV SOLUTION) IMPLANT
PACK BASIN DAY SURGERY FS (CUSTOM PROCEDURE TRAY) ×3 IMPLANT
PENCIL BUTTON HOLSTER BLD 10FT (ELECTRODE) ×3 IMPLANT
SLEEVE SCD COMPRESS KNEE MED (MISCELLANEOUS) ×3 IMPLANT
SPONGE LAP 18X18 X RAY DECT (DISPOSABLE) ×3 IMPLANT
SUT MON AB 4-0 PC3 18 (SUTURE) ×6 IMPLANT
SUT SILK 2 0 SH (SUTURE) IMPLANT
SUT VICRYL 3-0 CR8 SH (SUTURE) ×3 IMPLANT
SYR CONTROL 10ML LL (SYRINGE) ×3 IMPLANT
TOWEL OR 17X24 6PK STRL BLUE (TOWEL DISPOSABLE) ×3 IMPLANT
TOWEL OR NON WOVEN STRL DISP B (DISPOSABLE) ×3 IMPLANT
TUBE CONNECTING 20'X1/4 (TUBING)
TUBE CONNECTING 20X1/4 (TUBING) IMPLANT
YANKAUER SUCT BULB TIP NO VENT (SUCTIONS) IMPLANT

## 2016-05-15 NOTE — Anesthesia Procedure Notes (Addendum)
Anesthesia Regional Block:  Pectoralis block  Pre-Anesthetic Checklist: ,, timeout performed, Correct Patient, Correct Site, Correct Laterality, Correct Procedure, Correct Position, site marked, Risks and benefits discussed,  Surgical consent,  Pre-op evaluation,  At surgeon's request and post-op pain management  Laterality: Right  Prep: chloraprep       Needles:  Injection technique: Single-shot  Needle Type: Echogenic Needle     Needle Length: 9cm 9 cm Needle Gauge: 21 and 21 G    Additional Needles:  Procedures: ultrasound guided (picture in chart) Pectoralis block Narrative:  Injection made incrementally with aspirations every 5 mL.  Performed by: Personally  Anesthesiologist: ROSE, GEORGE  Additional Notes: Patient tolerated the procedure well without complications   Procedure Name: LMA Insertion Date/Time: 05/15/2016 8:48 AM Performed by: Jozey Janco D Pre-anesthesia Checklist: Patient identified, Emergency Drugs available, Suction available and Patient being monitored Patient Re-evaluated:Patient Re-evaluated prior to inductionOxygen Delivery Method: Circle system utilized Preoxygenation: Pre-oxygenation with 100% oxygen Intubation Type: IV induction Ventilation: Mask ventilation without difficulty LMA: LMA inserted LMA Size: 4.0 Number of attempts: 1 Airway Equipment and Method: Bite block Placement Confirmation: positive ETCO2 Tube secured with: Tape Dental Injury: Teeth and Oropharynx as per pre-operative assessment

## 2016-05-15 NOTE — Discharge Instructions (Signed)

## 2016-05-15 NOTE — Progress Notes (Signed)
Nuc med staff performed nuc med injection. Additional fentanyl given for comfort. Pt tol well. Will retrieve family from lobby and update/provide emotional support.

## 2016-05-15 NOTE — Anesthesia Postprocedure Evaluation (Signed)
Anesthesia Post Note  Patient: Sophia Kelley  Procedure(s) Performed: Procedure(s) (LRB): RIGHT BREAST LUMPECTOMY WITH RADIOACTIVE SEED AND SENTINEL LYMPH NODE BIOPSY (Right)  Patient location during evaluation: PACU Anesthesia Type: General and Regional Level of consciousness: awake and alert Pain management: pain level controlled Vital Signs Assessment: post-procedure vital signs reviewed and stable Respiratory status: spontaneous breathing, nonlabored ventilation, respiratory function stable and patient connected to nasal cannula oxygen Cardiovascular status: blood pressure returned to baseline and stable Postop Assessment: no signs of nausea or vomiting Anesthetic complications: no    Last Vitals:  Filed Vitals:   05/15/16 1008 05/15/16 1015  BP: 111/68 123/67  Pulse: 100 103  Temp: 36.6 C   Resp: 20 18    Last Pain:  Filed Vitals:   05/15/16 1017  PainSc: Asleep                 Evangelene Vora S

## 2016-05-15 NOTE — Progress Notes (Signed)
Assisted Dr. Rose with right, ultrasound guided, pectoralis block. Side rails up, monitors on throughout procedure. See vital signs in flow sheet. Tolerated Procedure well. 

## 2016-05-15 NOTE — Transfer of Care (Signed)
Immediate Anesthesia Transfer of Care Note  Patient: Sophia Kelley  Procedure(s) Performed: Procedure(s): RIGHT BREAST LUMPECTOMY WITH RADIOACTIVE SEED AND SENTINEL LYMPH NODE BIOPSY (Right)  Patient Location: PACU  Anesthesia Type:GA combined with regional for post-op pain  Level of Consciousness: awake and patient cooperative  Airway & Oxygen Therapy: Patient Spontanous Breathing and Patient connected to face mask oxygen  Post-op Assessment: Report given to RN and Post -op Vital signs reviewed and stable  Post vital signs: Reviewed and stable  Last Vitals:  Filed Vitals:   05/15/16 0812 05/15/16 0815  BP: 115/68   Pulse: 85 87  Temp:    Resp: 18 19    Last Pain: There were no vitals filed for this visit.       Complications: No apparent anesthesia complications

## 2016-05-15 NOTE — H&P (Signed)
Sophia Kelley  Location: [facility] Patient #: [id number] DOB: 10/06/1951 Married / Language: English / Race: White Female   History of Present Illness  The patient is a 65 year old female who presents with breast cancer. We are asked to see the patient in consultation by Dr. Sondra Come to evaluate her a right breast cancer. The patient is a 65 year old white female who recently went for a routine screening mammogram. At that time she was found to have a mass in the upper outer right breast. This measured 15m by u/s. 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 It was ER+, PR-, and her2 - with a Ki67 of 5%. her axilla looked neg. She complained only of some flank soreness but no breast pain or discharge from the nipple. She is concerned because she cares for her husband and father who are both sick.   Other Problems  Arthritis Back Pain Breast Cancer Chronic Obstructive Lung Disease High blood pressure Hypercholesterolemia Sleep Apnea Thyroid Disease  Past Surgical History  Appendectomy Cesarean Section - 1 Hysterectomy (not due to cancer) - Complete Tonsillectomy  Diagnostic Studies History  Colonoscopy within last year Mammogram within last year Pap Smear 1-5 years ago  Medication History  Medications Reconciled  Social History  Alcohol use Occasional alcohol use. Caffeine use Carbonated beverages, Coffee, Tea. No drug use Tobacco use Current some day smoker.  Family History  Arthritis Father, Mother. Diabetes Mellitus Son. Hypertension Mother. Kidney Disease Mother. Melanoma Father. Prostate Cancer Brother.  Pregnancy / Birth History  Age at menarche 89years. Age of menopause <45 Gravida 2 Maternal age 65-30Para 2 Regular periods    Review of Systems  General Not Present- Appetite Loss, Chills, Fatigue, Fever, Night Sweats, Weight Gain and Weight Loss. Skin Present- Dryness and Rash.  Not Present- Change in Wart/Mole, Hives, Jaundice, New Lesions, Non-Healing Wounds and Ulcer. HEENT Present- Sinus Pain and Wears glasses/contact lenses. Not Present- Earache, Hearing Loss, Hoarseness, Nose Bleed, Oral Ulcers, Ringing in the Ears, Seasonal Allergies, Sore Throat, Visual Disturbances and Yellow Eyes. Breast Not Present- Breast Mass, Breast Pain, Nipple Discharge and Skin Changes. Cardiovascular Present- Difficulty Breathing Lying Down, Rapid Heart Rate and Swelling of Extremities. Not Present- Chest Pain, Leg Cramps, Palpitations and Shortness of Breath. Gastrointestinal Present- Constipation and Difficulty Swallowing. Not Present- Abdominal Pain, Bloating, Bloody Stool, Change in Bowel Habits, Chronic diarrhea, Excessive gas, Gets full quickly at meals, Hemorrhoids, Indigestion, Nausea, Rectal Pain and Vomiting. Female Genitourinary Not Present- Frequency, Nocturia, Painful Urination, Pelvic Pain and Urgency. Musculoskeletal Present- Back Pain. Not Present- Joint Pain, Joint Stiffness, Muscle Pain, Muscle Weakness and Swelling of Extremities. Neurological Not Present- Decreased Memory, Fainting, Headaches, Numbness, Seizures, Tingling, Tremor, Trouble walking and Weakness. Psychiatric Present- Change in Sleep Pattern. Not Present- Anxiety, Bipolar, Depression, Fearful and Frequent crying. Endocrine Not Present- Cold Intolerance, Excessive Hunger, Hair Changes, Heat Intolerance, Hot flashes and New Diabetes. Hematology Not Present- Easy Bruising, Excessive bleeding, Gland problems, HIV and Persistent Infections.   Physical Exam  General Mental Status-Alert. General Appearance-Consistent with stated age. Hydration-Well hydrated. Voice-Normal.  Head and Neck Head-normocephalic, atraumatic with no lesions or palpable masses. Trachea-midline. Thyroid Gland Characteristics - normal size and consistency.  Eye Eyeball - Bilateral-Extraocular movements  intact. Sclera/Conjunctiva - Bilateral-No scleral icterus.  Chest and Lung Exam Chest and lung exam reveals -quiet, even and easy respiratory effort with no use of accessory muscles and on auscultation, normal breath sounds, no adventitious sounds  and normal vocal resonance. Inspection Chest Wall - Normal. Back - normal.  Breast Note: There is no palpable mass in either breast. there is no palpable axillary, supraclavicular, or cervical lymphadenopathy.   Cardiovascular Cardiovascular examination reveals -normal heart sounds, regular rate and rhythm with no murmurs and normal pedal pulses bilaterally.  Abdomen Inspection Inspection of the abdomen reveals - No Hernias. Skin - Scar - no surgical scars. Palpation/Percussion Palpation and Percussion of the abdomen reveal - Soft, Non Tender, No Rebound tenderness, No Rigidity (guarding) and No hepatosplenomegaly. Auscultation Auscultation of the abdomen reveals - Bowel sounds normal.  Neurologic Neurologic evaluation reveals -alert and oriented x 3 with no impairment of recent or remote memory. Mental Status-Normal.  Musculoskeletal Normal Exam - Left-Upper Extremity Strength Normal and Lower Extremity Strength Normal. Normal Exam - Right-Upper Extremity Strength Normal and Lower Extremity Strength Normal.  Lymphatic Head & Neck  General Head & Neck Lymphatics: Bilateral - Description - Normal. Axillary  General Axillary Region: Bilateral - Description - Normal. Tenderness - Non Tender. Femoral & Inguinal  Generalized Femoral & Inguinal Lymphatics: Bilateral - Description - Normal. Tenderness - Non Tender.    Assessment & Plan  BREAST CANCER OF UPPER-OUTER QUADRANT OF RIGHT FEMALE BREAST (C50.411) Impression: The patient appears to have a stage 1 cancer of the upper outer right breast. I have talked to her in detail about the different options for treatment and at this point she favors breast conservation. She  is also a good candidate for sentinel node mapping. I would plan for a right breast radioactive seed localized lumpectomy and sentinel node mapping. I have discussed with her in detail the risks and benefits of the surgery as well as some of the technical aspects and she understands and wishes to proceed. Current Plans Pt Education - Breast Cancer: discussed with patient and provided information.   Signed by Luella Cook, MD

## 2016-05-15 NOTE — Op Note (Signed)
05/15/2016  10:04 AM  PATIENT:  Sophia Kelley  65 y.o. female  PRE-OPERATIVE DIAGNOSIS:  RIGHT BREAST CANCER  POST-OPERATIVE DIAGNOSIS:  RIGHT BREAST CANCER  PROCEDURE:  Procedure(s): RIGHT BREAST LUMPECTOMY WITH RADIOACTIVE SEED AND SENTINEL LYMPH NODE BIOPSY (Right)  SURGEON:  Surgeon(s) and Role:    * Jovita Kussmaul, MD - Primary  PHYSICIAN ASSISTANT:   ASSISTANTS: none   ANESTHESIA:   general  EBL:  Total I/O In: 1500 [I.V.:1500] Out: -   BLOOD ADMINISTERED:none  DRAINS: none   LOCAL MEDICATIONS USED:  MARCAINE     SPECIMEN:  Source of Specimen:  right breast tissue and sentinel nodes X 3  DISPOSITION OF SPECIMEN:  PATHOLOGY  COUNTS:  YES  TOURNIQUET:  * No tourniquets in log *  DICTATION: .Dragon Dictation   After informed consent was obtained the patient was brought to the operating room and placed in the supine position on the operating room table. After adequate induction of general anesthesia the patient's right chest, breast, and axillary area were prepped with ChloraPrep, allowed to dry, and draped in usual sterile manner. An appropriate timeout was performed.  Previously an I-125 seed was placed in the upper outer quadrant of the right breast to mark an area of invasive breast cancer. Earlier in the day the patient underwent injection of 1 mCi of technetium sulfur colloid in the subareolar position on the right. At this point the neoprobe was set to technetium. The right axilla was examined and a hot spot was identified.  A small transversely oriented incision was made overlying the hot spot with a 15 blade knife. The incision was carried through the skin and subcutaneous tissue sharply with the electrocautery until the axilla was entered. A Weitlan retractor was deployed. Using the neoprobe to direct blunt hemostat dissection I was able to identify 3 lymph nodes with increased radioactivity. These were excised sharply with the electrocautery and the lymphatics  were controlled with clips. Ex vivo counts on these 3 nodes ranged from 1500 to 5000. These were sent as sentinel nodes #1 through 3. No other hot or palpable lymph nodes were identified in the right axilla.  The wound was infiltrated with quarter percent Marcaine. The wound was irrigated with saline. The deep layer of the wound was closed with interrupted 3-0 Vicryl stitches. The skin was then closed with a running 4-0 Monocryl subcuticular stitch. Attention was then turned to the right breast. The neoprobe was set to I-125. The area of radioactivity was readily identified in the upper outer quadrant of the right breast beneath the edge of the areola. The area was infiltrated with quarter percent Marcaine. A curvilinear incision was made along the upper and outer edge of the areola with a 15 blade knife. The incision was carried through the skin and subcutaneous tissue sharply with the electrocautery. While checking the area of radioactivity frequently with the neoprobe a circular portion of breast tissue was excised sharply around the radioactive seed. Once the specimen was removed it was oriented with the appropriate paint colors.  A specimen ready graft was obtained that showed the clip in seed to be in the center of the specimen. The specimen was then sent to pathology for further evaluation. The wound was irrigated with copious amounts of saline. Hemostasis was achieved using the Bovie electrocautery.  The deep layer of the wound was then closed with layers of interrupted 3-0 Vicryl stitches. The cavity was marked with clips. The skin was then closed with  interrupted 4-0 Monocryl subcuticular stitches. Dermabond dressings were applied. The patient tolerated the procedure well. At the end of the case all needle sponge and instrument counts were correct. The patient was then awakened and taken to recovery in stable condition.  PLAN OF CARE: Discharge to home after PACU  PATIENT DISPOSITION:  PACU -  hemodynamically stable.   Delay start of Pharmacological VTE agent (>24hrs) due to surgical blood loss or risk of bleeding: not applicable

## 2016-05-15 NOTE — Interval H&P Note (Signed)
History and Physical Interval Note:  05/15/2016 8:33 AM  Sophia Kelley  has presented today for surgery, with the diagnosis of RIGHT BREAST CANCER  The various methods of treatment have been discussed with the patient and family. After consideration of risks, benefits and other options for treatment, the patient has consented to  Procedure(s): RIGHT BREAST LUMPECTOMY WITH RADIOACTIVE SEED AND SENTINEL LYMPH NODE BIOPSY (Right) as a surgical intervention .  The patient's history has been reviewed, patient examined, no change in status, stable for surgery.  I have reviewed the patient's chart and labs.  Questions were answered to the patient's satisfaction.     TOTH III,PAUL S

## 2016-05-15 NOTE — Anesthesia Preprocedure Evaluation (Signed)
Anesthesia Evaluation  Patient identified by MRN, date of birth, ID band Patient awake    Reviewed: Allergy & Precautions, NPO status , Patient's Chart, lab work & pertinent test results  Airway Mallampati: II  TM Distance: >3 FB Neck ROM: Full    Dental no notable dental hx.    Pulmonary sleep apnea , Current Smoker,    Pulmonary exam normal breath sounds clear to auscultation       Cardiovascular Normal cardiovascular exam Rhythm:Regular Rate:Normal     Neuro/Psych negative neurological ROS  negative psych ROS   GI/Hepatic negative GI ROS, Neg liver ROS,   Endo/Other  Hypothyroidism Hyperthyroidism Morbid obesity  Renal/GU negative Renal ROS  negative genitourinary   Musculoskeletal negative musculoskeletal ROS (+)   Abdominal   Peds negative pediatric ROS (+)  Hematology negative hematology ROS (+)   Anesthesia Other Findings   Reproductive/Obstetrics negative OB ROS                             Anesthesia Physical Anesthesia Plan  ASA: III  Anesthesia Plan: General   Post-op Pain Management: GA combined w/ Regional for post-op pain   Induction: Intravenous  Airway Management Planned: LMA and Oral ETT  Additional Equipment:   Intra-op Plan:   Post-operative Plan: Extubation in OR  Informed Consent: I have reviewed the patients History and Physical, chart, labs and discussed the procedure including the risks, benefits and alternatives for the proposed anesthesia with the patient or authorized representative who has indicated his/her understanding and acceptance.   Dental advisory given  Plan Discussed with: CRNA and Surgeon  Anesthesia Plan Comments:         Anesthesia Quick Evaluation

## 2016-05-17 ENCOUNTER — Encounter: Payer: Self-pay | Admitting: Internal Medicine

## 2016-05-18 ENCOUNTER — Encounter (HOSPITAL_BASED_OUTPATIENT_CLINIC_OR_DEPARTMENT_OTHER): Payer: Self-pay | Admitting: General Surgery

## 2016-05-25 ENCOUNTER — Ambulatory Visit: Payer: BLUE CROSS/BLUE SHIELD | Admitting: Hematology and Oncology

## 2016-05-25 ENCOUNTER — Telehealth: Payer: Self-pay | Admitting: *Deleted

## 2016-05-25 NOTE — Assessment & Plan Note (Signed)
Right lumpectomy 05/15/2016: IDC grade 1, 1.3 cm, margins negative, 0/4 lymph nodes, T1 cN0 stage IA, ER 80%, PR 0%, HER-2 negative, Ki-67 5%  Pathology counseling: I discussed the final pathology report of the patient provided  a copy of this report. I discussed the margins as well as lymph node surgeries. We also discussed the final staging along with previously performed ER/PR and HER-2/neu testing.  Treatment plan: 1. Oncotype DX testing to determine if chemotherapy would be of any benefit followed by 2. Adjuvant radiation therapy followed by 3. Adjuvant antiestrogen therapy  Return to clinic based upon Oncotype DX testing

## 2016-05-25 NOTE — Telephone Encounter (Signed)
Received order per Dr. Lindi Adie for Oncotype Testing. Requisition sent to pathology. Received by Varney Biles. PAC sent to [facility]

## 2016-05-27 ENCOUNTER — Encounter: Payer: Self-pay | Admitting: General Practice

## 2016-05-27 ENCOUNTER — Telehealth: Payer: Self-pay | Admitting: *Deleted

## 2016-05-27 NOTE — Progress Notes (Signed)
Spiritual Care Note  Unable to reach pt by phone for f/u support, left VM with encouragement to return call.  Please also page if needs arise/circumstances change.  Thank you.  [place], [place], [facility] Pager [phone] Voicemail [phone]

## 2016-05-27 NOTE — Telephone Encounter (Signed)
Pt called and left msg with Dr. Geralyn Flash nurse stating she did not want to go forward with oncotype testing and that even if it were high she wouldn't do chemo. Discussed with Dr. Lindi Adie. Per Dr. Lindi Adie send pt to Dr. Sondra Come for xrt. Referral placed for Dr. Sondra Come. Pt will see Dr. Lindi Adie post op. Left vm for pt to return call to discuss this. Contact information provided.

## 2016-06-07 ENCOUNTER — Other Ambulatory Visit: Payer: Self-pay | Admitting: Oncology

## 2016-06-10 ENCOUNTER — Ambulatory Visit: Admission: RE | Admit: 2016-06-10 | Payer: BLUE CROSS/BLUE SHIELD | Source: Ambulatory Visit

## 2016-06-10 ENCOUNTER — Ambulatory Visit
Admission: RE | Admit: 2016-06-10 | Discharge: 2016-06-10 | Disposition: A | Discharge status: Home / Self Care | Payer: BLUE CROSS/BLUE SHIELD | Source: Ambulatory Visit | Attending: Radiation Oncology | Admitting: Radiation Oncology

## 2016-06-10 ENCOUNTER — Telehealth: Payer: Self-pay | Admitting: Oncology

## 2016-06-10 DIAGNOSIS — Z170 Estrogen receptor positive status [ER+]: Secondary | ICD-10-CM | POA: Insufficient documentation

## 2016-06-10 DIAGNOSIS — C50911 Malignant neoplasm of unspecified site of right female breast: Secondary | ICD-10-CM | POA: Insufficient documentation

## 2016-06-10 DIAGNOSIS — Z79899 Other long term (current) drug therapy: Secondary | ICD-10-CM | POA: Insufficient documentation

## 2016-06-10 DIAGNOSIS — Z885 Allergy status to narcotic agent status: Secondary | ICD-10-CM | POA: Insufficient documentation

## 2016-06-10 NOTE — Telephone Encounter (Signed)
Patient called back and said that she was not able to come to her appointment today because she has been taking care of her husband after a knee procedure.  She would like to reschedule.  Advised her that a scheduler will call her tomorrow at her cell phone number of [phone].

## 2016-06-10 NOTE — Telephone Encounter (Signed)
Left a message for patient regarding her appointment today with Dr. Sondra Come.  Requested a return call.

## 2016-06-15 ENCOUNTER — Telehealth: Payer: Self-pay | Admitting: Gastroenterology

## 2016-06-15 NOTE — Telephone Encounter (Signed)
On Linzess 265mcg daily. Has chronic constipation. Reports she has been impacted before and had to have "help". She calls because she was straining "very hard and very long" trying to defecate. She had minimal results, but had pain and bleeding after doing this. Now it hurts without trying to have a bowel movement. She is seeing [facility] per rectum. Agrees to go to the ER for evaluation.

## 2016-06-18 ENCOUNTER — Telehealth: Payer: Self-pay | Admitting: Gastroenterology

## 2016-06-19 NOTE — Telephone Encounter (Signed)
I can offer her an appointment with the APP. First available at this point is 06/29/16. I left a message requesting a call back.

## 2016-06-25 ENCOUNTER — Ambulatory Visit
Admission: RE | Admit: 2016-06-25 | Discharge: 2016-06-25 | Disposition: A | Discharge status: Home / Self Care | Payer: BLUE CROSS/BLUE SHIELD | Source: Ambulatory Visit | Attending: Radiation Oncology | Admitting: Radiation Oncology

## 2016-06-25 ENCOUNTER — Encounter: Payer: Self-pay | Admitting: Radiation Oncology

## 2016-06-25 VITALS — BP 131/79 | HR 99 | Temp 98.6°F | Resp 18 | Ht 66.0 in | Wt 271.5 lb

## 2016-06-25 DIAGNOSIS — C50911 Malignant neoplasm of unspecified site of right female breast: Secondary | ICD-10-CM | POA: Diagnosis not present

## 2016-06-25 DIAGNOSIS — Z170 Estrogen receptor positive status [ER+]: Secondary | ICD-10-CM | POA: Diagnosis present

## 2016-06-25 DIAGNOSIS — C50411 Malignant neoplasm of upper-outer quadrant of right female breast: Secondary | ICD-10-CM

## 2016-06-25 DIAGNOSIS — Z79899 Other long term (current) drug therapy: Secondary | ICD-10-CM | POA: Diagnosis not present

## 2016-06-25 DIAGNOSIS — Z885 Allergy status to narcotic agent status: Secondary | ICD-10-CM | POA: Diagnosis not present

## 2016-06-25 NOTE — Progress Notes (Signed)
Radiation Oncology         [phone] ________________________________  Name: Sophia Kelley MRN: [id number]  Date: 06/25/2016  DOB: 1951/09/27  Re-Evaluation Visit Note  CC: Nilda Simmer, MD  Jovita Kussmaul, MD    ICD-9-CM ICD-10-CM   1. Breast cancer of upper-outer quadrant of right female breast ([place]) 174.4 C50.411     Diagnosis: Stage IA (pT1c, pN0) invasive ductal carcinoma of the right breast (ER positive, PR negative, HER2 negative)    Narrative:  The patient returns today for a re-evaluation. She was seen in Breast Clinic on 04/29/16. Right lumpectomy and sentinel lymph node biopsy on 05/15/16 revealed grade 1 invasive ductal carcinoma (1.3 cm) with negative margins. All right axillary sentinel lymph nodes biopsied were negative.  Tissue was sent for Oncotype DX testing, but the patient decided not to go forward with testing since she decided she would not do chemotherapy, even if the results came back high. The patient presents today to discuss the role of radiation in the management of her disease.  ALLERGIES:  is allergic to codeine.  Meds: Current Outpatient Prescriptions  Medication Sig Dispense Refill  . Linaclotide (LINZESS) 290 MCG CAPS capsule Take 1 capsule (290 mcg total) by mouth daily. 30 capsule 11  . methimazole (TAPAZOLE) 10 MG tablet Take 20 mg by mouth daily.    . metoprolol (LOPRESSOR) 50 MG tablet Take 50 mg by mouth 2 (two) times daily.    . Multiple Vitamin (MULTIVITAMIN) tablet Take 1 tablet by mouth daily.    Marland Kitchen nystatin-triamcinolone (MYCOLOG II) cream Apply 1 application topically 2 (two) times daily.    . traMADol (ULTRAM) 50 MG tablet Take 1-2 tablets (50-100 mg total) by mouth every 6 (six) hours as needed. 30 tablet 1  . HYDROcodone-acetaminophen (NORCO) 5-325 MG tablet Take 1-2 tablets by mouth every 6 (six) hours as needed. (Patient not taking: Reported on 06/25/2016) 30 tablet 0   No current facility-administered medications for this  encounter.    Physical Findings: The patient is in no acute distress. Patient is alert and oriented.  height is '5\' 6"'$  (1.676 m) and weight is 271 lb 8 oz (123.152 kg). Her oral temperature is 98.6 F (37 C). Her blood pressure is 131/79 and her pulse is 99. Her respiration is 18 and oxygen saturation is 98%.    Lungs are clear to auscultation bilaterally. Heart has regular rate and rhythm. No palpable cervical, supraclavicular, or axillary adenopathy.  Periareolar scar in the UOQ which is healing well without sign of drainage or infection. Second scar in the right axillary region healing well without sign of drainage or infection.  Lab Findings: Lab Results  Component Value Date   WBC 6.4 04/29/2016   HGB 14.1 04/29/2016   HCT 43.7 04/29/2016   MCV 85.0 04/29/2016   PLT 174 04/29/2016    Radiographic Findings: No results found.  Impression: Stage IA (pT1c, pN0) invasive ductal carcinoma of the right breast (ER positive, PR negative, HER2 negative)  The patient is a good candidate for breast conservation radiation.  Plan: I spoke to the patient today regarding her diagnosis and options for treatment. We discussed the equivalence in terms of survival and local failure between mastectomy and breast conservation. We discussed the role of radiation in decreasing local failures in patients who undergo lumpectomy. We discussed the process of simulation and the placement tattoos. We discussed 4-6 weeks of treatment as an outpatient. We discussed the possibility of asymptomatic lung damage. We discussed  the low likelihood of secondary malignancies. We discussed the possible side effects including but not limited to skin redness, fatigue, permanent skin darkening, and breast swelling. The patient signed a consent form and this was placed in her medical chart. CT simulation is scheduled on 06/30/16 at [facility].  ____________________________________ -----------------------------------  Blair Promise, PhD, MD  This document serves as a record of services personally performed by Gery Pray, MD. It was created on his behalf by Darcus Austin, a trained medical scribe. The creation of this record is based on the scribe's personal observations and the provider's statements to them. This document has been checked and approved by the attending provider.

## 2016-06-25 NOTE — Addendum Note (Signed)
Encounter addended by: Jacqulyn Liner, RN on: 06/25/2016  4:26 PM<BR>     Documentation filed: Charges VN

## 2016-06-25 NOTE — Progress Notes (Signed)
Histology per Pathology Report:   05/15/16 Diagnosis 1. Lymph node, sentinel, biopsy, Right Axillary #1 ONE BENIGN LYMPH NODE (0/1) 2. Lymph node, sentinel, biopsy, Right Axillary #2 ONE BENIGN LYMPH NODE (0/1) 3. Lymph node, sentinel, biopsy, Right ONE BENIGN LYMPH NODE (0/1) 4. Lymph node, sentinel, biopsy, Right Axillary #3 ONE BENIGN LYMPH NODE (0/1) 5. Breast, lumpectomy, Right w/seed INVASIVE DUCTAL CARCINOMA (1.3 CM), GRADE 1 MARGINS OF RESECTION ARE NEGATIVE FOR CARCINOMA PREVIOUS BIOPSY SITE CHANGES  04/23/16 Diagnosis Breast, right, needle core biopsy, 11:00 o'clock - INVASIVE MAMMARY CARCINOMA. - SEE COMMENT  Receptor Status: ER(80%), PR (0%), Her2-neu (negative), Ki-(5%)  Did patient present with symptoms (if so, please note symptoms) or was this found on screening mammography?: screening mammogram  Past/Anticipated interventions by surgeon, if any: 05/15/16 - Procedure: RIGHT BREAST LUMPECTOMY WITH RADIOACTIVE SEED AND SENTINEL LYMPH NODE BIOPSY; Surgeon: Autumn Messing III, MD  Past/Anticipated interventions by medical oncology, if any: The plan per Dr. Lindi Adie is for "breast conserving surgery followed by Oncotype DX testing to determine if chemotherapy would be of any benefit followed by adjuvant radiation therapy followed by adjuvant antiestrogen therapy. Patient has refused to have Oncotype DX testing.  Lymphedema issues, if any: {No  Pain issues, if any:3/10 Tylenol  OB Gyn history: She menarched at early age of 78 and went to menopause at age 81. She had 2 pregnancy, her first child was born at age 64. She as not received birth control pills. She was never exposed to fertility medications or hormone replacement therapy. She has no family history of Breast/GYN/GI cancer.  SAFETY ISSUES:  Prior radiation? No  Pacemaker/ICD? No  Possible current pregnancy?No  Is the patient on methotrexate? No  Current Complaints / other details:Here to discuss radiation  plan. BP 131/79 mmHg  Pulse 99  Temp(Src) 98.6 F (37 C) (Oral)  Resp 18  Ht _0  (1.676 m)  Wt 271 lb 8 oz (123.152 kg)  BMI 43.84 kg/m2  SpO2 98%   Jacqulyn Liner, RN 06/05/2016,9:00 AM

## 2016-06-26 NOTE — Telephone Encounter (Signed)
Spouse called to say Monday doesn't work. I gave  Her 07-22-16 w/Dr Havery Moros since he has not been seen since Dr Olevia Perches.

## 2016-06-30 ENCOUNTER — Ambulatory Visit: Payer: BLUE CROSS/BLUE SHIELD | Admitting: Radiation Oncology

## 2016-06-30 ENCOUNTER — Ambulatory Visit
Admission: RE | Admit: 2016-06-30 | Discharge: 2016-06-30 | Disposition: A | Discharge status: Home / Self Care | Payer: BLUE CROSS/BLUE SHIELD | Source: Ambulatory Visit | Attending: Radiation Oncology | Admitting: Radiation Oncology

## 2016-07-06 ENCOUNTER — Ambulatory Visit (INDEPENDENT_AMBULATORY_CARE_PROVIDER_SITE_OTHER): Payer: BLUE CROSS/BLUE SHIELD | Admitting: Internal Medicine

## 2016-07-06 ENCOUNTER — Encounter: Payer: Self-pay | Admitting: Internal Medicine

## 2016-07-06 VITALS — BP 144/82 | Temp 106.0°F | Wt 268.0 lb

## 2016-07-06 DIAGNOSIS — E0500 Thyrotoxicosis with diffuse goiter without thyrotoxic crisis or storm: Secondary | ICD-10-CM

## 2016-07-06 LAB — TSH: TSH: 0.06 u[IU]/mL — AB (ref 0.35–4.50)

## 2016-07-06 NOTE — Progress Notes (Signed)
Patient ID: Sophia Kelley, female   DOB: [birth year], 65 y.o.   MRN: [id number]   HPI  Sophia Kelley is a 65 y.o.-year-old female, initially referred by her cardiologist, Dr Aundra Dubin, now returning for Graves ds. Last visit 3 mo ago.  Since last visit, she was dx with BrCa >> had mastectomy >> will start RxTx next week.  Reviewed and addended hx: Pt. has been dx with hypothyroidism in 2013; her TPO antibodies were found to be elevated, confirming Hashimoto thyroiditis. She was on Levothyroxine 137 mcg, now off for ~2 years.  She went to the hospital with SOB >> r/o PE and had a catheterization >> no CAD, normal EF. She had the cath on 11/01/2015.  Patient had thyrotoxic thyroid tests in 09/2015, after which she was referred to see me. By the time I saw her a month later her free T4 and free T3 were normal and her TSH was much better, therefore, we decided to recheck the thyroid tests in 1-1/2 months to see if they improve. They did not >> we checked an Uptake and scan:  Uptake and scan (01/28/2016):  There is uniform uptake within an enlarged thyroid gland no nodularity. The pyramidal lobe is faintly identified.  24 hour I 131 uptake = 38 .8% (normal 10-30%)  IMPRESSION: Imaging and thyroid uptake are most suggestive of Graves disease.  We started MMI 5 mg daily, then increased to 5 mg bid on 03/09/2016 and to 10 mg bid in 03/2016, as she refused RAI tx.  We also started a beta blocker, Toprol-XL 25 mg daily at last visit >> now bid. Pulse 106 today.  I reviewed pt's thyroid tests: Lab Results  Component Value Date   TSH 0.15 (L) 04/02/2016   TSH 0.13 (L) 03/09/2016   TSH 0.27 (L) 01/03/2016   TSH 0.16 (L) 11/06/2015   TSH 2.66 07/02/2011   TSH 4.24 02/20/2011   TSH 2.94 01/23/2011   TSH 2.96 11/20/2010   FREET4 1.73 (H) 04/02/2016   FREET4 2.41 (H) 03/09/2016   FREET4 1.91 (H) 01/03/2016   FREET4 1.15 11/06/2015   FREET4 0.95 02/20/2011  11/06/2015: TSI antibodies 331  (<140%) 10/10/2015: TPO antibodies >1000 10/10/2015: TSH <0.006, total T3 3.3 (1-1.7), free T4 1.97 (0.82-1.77) - not on thyroid hormones at that time  02/20/2015: TSH 0.04, free T4 1.25  Pt still feels poorly - she describes: - + tremors - no palpitations  - + heat intolerance - + leg swelling and erythema - + fatigue - + Weight gain - + hoarseness  - + severe constipation - was seeing Dr. Olevia Perches - on Linzess - + dry skin, also rashes on arms - + hair loss  Pt denies feeling nodules in neck, + hoarseness, + occasional choking-not new, + dysphagia/no odynophagia, no SOB with lying down.  She has + FH of thyroid disorders in: mother, M aunts, all cousins - 2 relatives with Graves ds. No FH of thyroid cancer.  No h/o radiation tx to head or neck. No recent use of iodine supplements.  She has wrist gout >> was on Prednisone several years ago, no steroids recently.  ROS: Constitutional: see HPI Eyes: + blurry vision, no xerophthalmia ENT: + see HPI Cardiovascular: no CP/SOB/ no palpitations/+ leg swelling Respiratory: no cough/SOB Gastrointestinal: no N/V/D/+ C Musculoskeletal: + muscle/no joint aches Skin: no rashes, + hair loss, + rash  Neurological:+ tremors/numbness/tingling/dizziness, no HA  I reviewed pt's medications, allergies, PMH, social hx, family hx, and changes were  documented in the history of present illness. Otherwise, unchanged from my initial visit note.  Past Medical History:  Diagnosis Date  . Arthritis    right arm  . Breast cancer of upper-outer quadrant of right female breast ([place]) 04/24/2016  . Complication of anesthesia    states was hard to wake up after 1 c-section  . Constipation   . Graves' disease   . Immature cataract   . Lumbar herniated disc   . Nasal congestion 05/08/2016  . Obesity   . Shortness of breath dyspnea    with ADLs - no home O2  . Sinus problem   . Sleep apnea    had sleep study, did not go back for CPAP fitting  .  Tachycardia    states due to Graves' disease   Past Surgical History:  Procedure Laterality Date  . ABDOMINAL HYSTERECTOMY  1990   complete  . BREAST LUMPECTOMY WITH RADIOACTIVE SEED AND SENTINEL LYMPH NODE BIOPSY Right 05/15/2016   Procedure: RIGHT BREAST LUMPECTOMY WITH RADIOACTIVE SEED AND SENTINEL LYMPH NODE BIOPSY;  Surgeon: Autumn Messing III, MD;  Location: [facility];  Service: General;  Laterality: Right;  . CARDIAC CATHETERIZATION N/A 11/01/2015   Procedure: Right/Left Heart Cath and Coronary Angiography;  Surgeon: Larey Dresser, MD;  Location: [facility] [facility];  Service: Cardiovascular;  Laterality: N/A;  . CESAREAN SECTION  [id number]   x 2  . COLONOSCOPY WITH PROPOFOL  06/12/2014  . LAPAROSCOPIC APPENDECTOMY N/A 01/31/2014   Procedure: APPENDECTOMY LAPAROSCOPIC;  Surgeon: Harl Bowie, MD;  Location: [facility];  Service: General;  Laterality: N/A;  . NASAL SEPTUM SURGERY  1984  . RECTOCELE REPAIR  09/01/2007   Social History   Social History  . Marital status: Married    Spouse name: N/A  . Number of children: 2  . Years of education: 14   Occupational History  . unemployed   .  Unemployed   Social History Main Topics  . Smoking status: Former Smoker    Years: 40.00    Types: Cigarettes    Quit date: 05/14/2016  . Smokeless tobacco: Never Used     Comment: 5-6 cig./day  . Alcohol use No  . Drug use: No  . Sexual activity: Yes    Birth control/ protection: Surgical   Other Topics Concern  . Not on file   Social History Narrative   Caffeine Use - 1 cup coffee/day   Patient is right handed.         Current Outpatient Prescriptions on File Prior to Visit  Medication Sig Dispense Refill  . Linaclotide (LINZESS) 290 MCG CAPS capsule Take 1 capsule (290 mcg total) by mouth daily. 30 capsule 11  . methimazole (TAPAZOLE) 10 MG tablet Take 20 mg by mouth daily.    . metoprolol (LOPRESSOR) 50 MG tablet Take 50 mg by mouth 2 (two) times daily.    .  Multiple Vitamin (MULTIVITAMIN) tablet Take 1 tablet by mouth daily.    Marland Kitchen nystatin-triamcinolone (MYCOLOG II) cream Apply 1 application topically 2 (two) times daily.    . traMADol (ULTRAM) 50 MG tablet Take 1-2 tablets (50-100 mg total) by mouth every 6 (six) hours as needed. 30 tablet 1  . HYDROcodone-acetaminophen (NORCO) 5-325 MG tablet Take 1-2 tablets by mouth every 6 (six) hours as needed. (Patient not taking: Reported on 07/06/2016) 30 tablet 0   No current facility-administered medications on file prior to visit.    Allergies  Allergen Reactions  .  Codeine Rash   Family History  Problem Relation Age of Onset  . Heart attack Father 48    x3  . Allergies Father   . Arthritis Father   . Heart disease Father   . Prostate cancer Brother   . Thyroid disease Mother   . Hypertension Mother   . Arthritis Mother   . Osteoporosis Mother    PE: BP (!) 144/82   Temp (!) 106 F (41.1 C) (Oral)   Wt 268 lb (121.6 kg)   SpO2 97%   BMI 43.26 kg/m  Body mass index is 43.26 kg/m. Wt Readings from Last 3 Encounters:  07/06/16 268 lb (121.6 kg)  06/25/16 271 lb 8 oz (123.2 kg)  05/15/16 261 lb (118.4 kg)   Constitutional: obese, in NAD Eyes: PERRLA, EOMI, no exophthalmos, no lid lag, no stare ENT: moist mucous membranes, no thyromegaly, "lumpy-bumpy" thyroid (prominent L lobe)no cervical lymphadenopathy Cardiovascular: tachycardia, RR, No MRG Respiratory: CTA B Gastrointestinal: abdomen soft, NT, ND, BS+ Musculoskeletal: no deformities, strength intact in all 4 Skin: moist, warm, + rash ant lower legs (stasis dermatitis?) Neurological: + tremor with outstretched hands, DTR normal in all 4  ASSESSMENT: 1. Graves ds.  2. H/o Hashimoto's thyroiditis  PLAN:  1. And 2. Patient with h/o Hashimoto's hypothyroidism, off levothyroxine therapy for the last 3 years. However, approximately a year ago, she developed tachycardia, heat intolerance and tremors. Her TPO ABs were >1000, so I  assumed thyrotoxic phase of Hashimoto's thyroiditis. However, as TFTs did not improve, we checked a thyroid uptake and scan test in 01/2016 >> dx of Graves ds. We started her on methimazole 5 mg once a day, however, since the tests were not improving, we increased this to 2x a day. We also started Toprol-XL 25 mg daily, and this was increased by her cardiologist to 2x a day. She is still feeling poorly, with tremors, heat intolerance, and tachycardia (pulse today is 106). She also has dysphagia. At this point, she agrees to have definitive treatment of her Graves' disease: RAI treatment. I again explained that surgery is also an option, but this would be the last resort. She agrees with this.  - We will recheck her TSH, fT3 and fT4 today, and, probably go ahead with RAI treatment. I do not think this will interfere with her RxTx for the BrCA - I will see her back in 3 months  Office Visit on 07/06/2016  Component Date Value Ref Range Status  . T3, Free 07/07/2016 6.2* 2.3 - 4.2 pg/mL Final  . Free T4 07/07/2016 2.26* 0.60 - 1.60 ng/dL Final  . TSH 07/06/2016 0.06* 0.35 - 4.50 uIU/mL Final   Labs worse >> will increase the Methimazole to 10 mg in am, 5 mg With lunch, and 10 mg with dinner, order the RAI tx. Will advise her to stop the methimazole 3 days prior to the RAI treatment. We need another set of TFTs in 1 month after RAI treatment.  Orders Placed This Encounter  Procedures  . NM RAI Therapy For Hyperthyroidism  . T4, free  . T3, free  . TSH   Philemon Kingdom, MD PhD [facility] Endocrinology

## 2016-07-06 NOTE — Patient Instructions (Signed)
Please stop at the lab.  Please continue Methimazole 10 mg 2x a day.   Please come back for a follow-up appointment in 3 months.

## 2016-07-07 LAB — T3, FREE: T3 FREE: 6.2 pg/mL — AB (ref 2.3–4.2)

## 2016-07-07 LAB — T4, FREE: FREE T4: 2.26 ng/dL — AB (ref 0.60–1.60)

## 2016-07-07 MED ORDER — METHIMAZOLE 10 MG PO TABS: ORAL_TABLET | ORAL | 1 refills | Status: DC

## 2016-07-13 ENCOUNTER — Telehealth: Payer: Self-pay | Admitting: Oncology

## 2016-07-13 ENCOUNTER — Telehealth: Payer: Self-pay | Admitting: *Deleted

## 2016-07-13 NOTE — Telephone Encounter (Signed)
Called and left a voice message after 2 attempts. Question if Mrs. Sophia Kelley to ascertain if she is planning on having Radiation Therapy as originally planned.  Given number to call to update her status.

## 2016-07-13 NOTE — Telephone Encounter (Signed)
Called Sophia Kelley and asked if she is ready to start radiation.  She apologized for not getting back to [place] and said her father has been sick and receiving daily radiation.  She said her husband also needs knee surgery so she is trying to figure out how to make time for her treatment.  She said she can have her CT SIM this week.  Advised her that we will call her tomorrow to schedule the [facility] appointment.  She verbalized understanding and agreement and said to contact her on her home phone as her cell phone has not been working.

## 2016-07-16 ENCOUNTER — Ambulatory Visit (HOSPITAL_COMMUNITY): Payer: BLUE CROSS/BLUE SHIELD

## 2016-07-16 ENCOUNTER — Telehealth: Payer: Self-pay | Admitting: Internal Medicine

## 2016-07-16 NOTE — Telephone Encounter (Signed)
New message   Pt wants an appt asap. Please call.   Patient c/o Palpitations:  High priority if patient c/o lightheadedness and shortness of breath.  1. How long have you been having palpitations? Over past couple of weeks  2. Are you currently experiencing lightheadedness and shortness of breath? Yes SOB   3. Have you checked your BP and heart rate? (document readings) no she has not checked themk  4. Are you experiencing any other symptoms? Sweating

## 2016-07-16 NOTE — Telephone Encounter (Addendum)
This was filed as a patient of Dr. Debara Pickett, but patient has been followed by Dr. Aundra Dubin and requested appt w him.  Spoke to patient, she was transferred to me after communicating symptoms of SOB and palpitations to the operator. She explains that she has been waiting 45 mins to schedule an appt w Dr. Aundra Dubin and wants something set up "right away". She asked if I could schedule.  I explained that the operator transferred her to me for concern of acute symptoms. Asked if she could tell me more about what was going on. Patient refused to elaborate on symptoms, other than to state "my heart is racing and I just need to see Dr. Aundra Dubin right away - can you schedule an appointment or not?" I asked politely about her symptoms a 2nd time and gave her rationale for speaking to RN to assess her problems - patient stated "In that case, I'm not having any problems at all, I just need an appointment scheduled, if you can't help me, have someone make me an appointment and call me back".  Patient ended call.  Will route to [facility] scheduling and triage.

## 2016-07-22 ENCOUNTER — Ambulatory Visit: Payer: BLUE CROSS/BLUE SHIELD | Admitting: Gastroenterology

## 2016-07-22 ENCOUNTER — Other Ambulatory Visit: Payer: Self-pay | Admitting: General Surgery

## 2016-07-22 DIAGNOSIS — N644 Mastodynia: Secondary | ICD-10-CM

## 2016-07-24 ENCOUNTER — Other Ambulatory Visit: Payer: Self-pay | Admitting: General Surgery

## 2016-07-24 ENCOUNTER — Ambulatory Visit
Admission: RE | Admit: 2016-07-24 | Discharge: 2016-07-24 | Disposition: A | Discharge status: Home / Self Care | Payer: BLUE CROSS/BLUE SHIELD | Source: Ambulatory Visit | Attending: General Surgery | Admitting: General Surgery

## 2016-07-24 DIAGNOSIS — N644 Mastodynia: Secondary | ICD-10-CM

## 2016-07-28 ENCOUNTER — Other Ambulatory Visit: Payer: Self-pay | Admitting: General Surgery

## 2016-07-28 ENCOUNTER — Ambulatory Visit
Admission: RE | Admit: 2016-07-28 | Discharge: 2016-07-28 | Disposition: A | Discharge status: Home / Self Care | Payer: BLUE CROSS/BLUE SHIELD | Source: Ambulatory Visit | Attending: General Surgery | Admitting: General Surgery

## 2016-07-28 DIAGNOSIS — N644 Mastodynia: Secondary | ICD-10-CM

## 2016-08-02 LAB — AEROBIC/ANAEROBIC CULTURE (SURGICAL/DEEP WOUND): CULTURE: NO GROWTH

## 2016-08-02 LAB — AEROBIC/ANAEROBIC CULTURE W GRAM STAIN (SURGICAL/DEEP WOUND)

## 2016-08-24 ENCOUNTER — Telehealth: Payer: Self-pay | Admitting: *Deleted

## 2016-08-24 NOTE — Telephone Encounter (Signed)
Left message to follow up and see if she is planning on pursuing radiation therapy.

## 2016-09-01 ENCOUNTER — Encounter: Payer: Self-pay | Admitting: Gastroenterology

## 2016-09-01 ENCOUNTER — Ambulatory Visit (INDEPENDENT_AMBULATORY_CARE_PROVIDER_SITE_OTHER): Payer: BLUE CROSS/BLUE SHIELD | Admitting: Gastroenterology

## 2016-09-01 VITALS — BP 132/74 | HR 86 | Ht 66.0 in | Wt 262.0 lb

## 2016-09-01 DIAGNOSIS — K5900 Constipation, unspecified: Secondary | ICD-10-CM

## 2016-09-01 DIAGNOSIS — K5909 Other constipation: Secondary | ICD-10-CM

## 2016-09-01 MED ORDER — LINACLOTIDE 290 MCG PO CAPS: 290.0000 ug | ORAL_CAPSULE | Freq: Every day | ORAL | 3 refills | Status: DC

## 2016-09-01 NOTE — Patient Instructions (Signed)
We have sent the following medications to your pharmacy for you to pick up at your convenience: Linzess 290 mg every 3 days.

## 2016-09-01 NOTE — Progress Notes (Signed)
HPI :  65 y/o female with history of chronic constipation, here for a follow up visit. New patient to me.   She reports chronic constipation for several years. She reports she has been on a variety of regimens, but most recently Linzess, which has worked a bit better for her. She takes Linzess every 3 days which works well for her. If she doesn't take it she won't have a bowel movement. She has a BM once every 3 days then when she takes it and feels well at this frequently. No blood in the stools.   She thinks aunt had colon cancer, no first degree relatives with CRC. Brother had prostate cancer.  She has Graves disease and thinks she is going for thyroidectomy in the near future. She is recovering from therapy for breast cancer.   She otherwise is asking about when she needs her next surveillance colonoscopy. Prior colonoscopy as below  Colonoscopy 05/2014 - multiple left sided hyperplastic polyps Colonoscopy Jan 2011 -hyperplastic polyps left colon Colonoscopy 2003 - hyperplastic polyp left colon Colonoscopy 2001 - few left sided polyps, one adenoma    Past Medical History:  Diagnosis Date  . Arthritis    right arm  . Breast cancer of upper-outer quadrant of right female breast ([place]) 04/24/2016  . Colon polyps   . Complication of anesthesia    states was hard to wake up after 1 c-section  . Constipation   . Graves' disease   . Immature cataract   . Lumbar herniated disc   . Nasal congestion 05/08/2016  . Obesity   . Shortness of breath dyspnea    with ADLs - no home O2  . Sinus problem   . Sleep apnea    had sleep study, did not go back for CPAP fitting  . Tachycardia    states due to Graves' disease     Past Surgical History:  Procedure Laterality Date  . ABDOMINAL HYSTERECTOMY  1990   complete  . BREAST LUMPECTOMY WITH RADIOACTIVE SEED AND SENTINEL LYMPH NODE BIOPSY Right 05/15/2016   Procedure: RIGHT BREAST LUMPECTOMY WITH RADIOACTIVE SEED AND SENTINEL LYMPH NODE  BIOPSY;  Surgeon: Autumn Messing III, MD;  Location: [place];  Service: General;  Laterality: Right;  . CARDIAC CATHETERIZATION N/A 11/01/2015   Procedure: Right/Left Heart Cath and Coronary Angiography;  Surgeon: Larey Dresser, MD;  Location: [place] [facility];  Service: Cardiovascular;  Laterality: N/A;  . CESAREAN SECTION  [id number]   x 2  . COLONOSCOPY WITH PROPOFOL  06/12/2014  . LAPAROSCOPIC APPENDECTOMY N/A 01/31/2014   Procedure: APPENDECTOMY LAPAROSCOPIC;  Surgeon: Harl Bowie, MD;  Location: [facility];  Service: General;  Laterality: N/A;  . NASAL SEPTUM SURGERY  1984  . RECTOCELE REPAIR  09/01/2007   Family History  Problem Relation Age of Onset  . Heart attack Father 48    x3  . Allergies Father   . Arthritis Father   . Heart disease Father   . Prostate cancer Brother   . Thyroid disease Mother   . Hypertension Mother   . Arthritis Mother   . Osteoporosis Mother    Social History  Substance Use Topics  . Smoking status: Former Smoker    Years: 40.00    Types: Cigarettes    Quit date: 05/14/2016  . Smokeless tobacco: Never Used     Comment: 5-6 cig./day  . Alcohol use No   Current Outpatient Prescriptions  Medication Sig Dispense Refill  .  HYDROcodone-acetaminophen (NORCO) 5-325 MG tablet Take 1-2 tablets by mouth every 6 (six) hours as needed. 30 tablet 0  . Linaclotide (LINZESS) 290 MCG CAPS capsule Take 1 capsule (290 mcg total) by mouth daily. 30 capsule 11  . methimazole (TAPAZOLE) 10 MG tablet Take 10 mg in a.m., 5 mg with lunch and 10 mg with dinner 60 tablet 1  . metoprolol (LOPRESSOR) 50 MG tablet Take 50 mg by mouth 2 (two) times daily.    . Multiple Vitamin (MULTIVITAMIN) tablet Take 1 tablet by mouth daily.    Marland Kitchen nystatin-triamcinolone (MYCOLOG II) cream Apply 1 application topically 2 (two) times daily.    . traMADol (ULTRAM) 50 MG tablet Take 1-2 tablets (50-100 mg total) by mouth every 6 (six) hours as needed. 30 tablet 1   No  current facility-administered medications for this visit.    Allergies  Allergen Reactions  . Codeine Rash     Review of Systems: All systems reviewed and negative except where noted in HPI.   Lab Results  Component Value Date   WBC 6.4 04/29/2016   HGB 14.1 04/29/2016   HCT 43.7 04/29/2016   MCV 85.0 04/29/2016   PLT 174 04/29/2016    Lab Results  Component Value Date   CREATININE 0.8 04/29/2016   BUN 11.8 04/29/2016   NA 139 04/29/2016   K 4.5 04/29/2016   CL 103 10/17/2015   CO2 26 04/29/2016   Lab Results  Component Value Date   ALT 17 04/29/2016   AST 12 04/29/2016   ALKPHOS 108 04/29/2016   BILITOT 0.34 04/29/2016       Physical Exam: BP 132/74   Pulse 86   Ht 5\' 6"  (1.676 m)   Wt 262 lb (118.8 kg)   BMI 42.29 kg/m  Constitutional: Pleasant,well-developed, female in no acute distress. HEENT: Normocephalic and atraumatic. Conjunctivae are normal. No scleral icterus. Neck supple.  Cardiovascular: Normal rate, regular rhythm.  Pulmonary/chest: Effort normal and breath sounds normal. No wheezing, rales or rhonchi. Abdominal: Soft, nondistended, obese, nontender. Bowel sounds active throughout. There are no masses palpable. No hepatomegaly. Extremities: no edema Lymphadenopathy: No cervical adenopathy noted. Neurological: Alert and oriented to person place and time. Skin: Skin is warm and dry. No rashes noted. Psychiatric: Normal mood and affect. Behavior is normal.   ASSESSMENT AND PLAN: 65 y/o female with chronic constipation and history of multiple benign left sided hyperplastic polyps.   She will continue with current regimen, working well for her constipation and she is happy with it. She can take Linzess more frequently if needed moving forward.  Otherwise reviewed prior colonoscopies. She has no significant FH of CRC. Based on her multiple left sided hyperplastic polyps, I counseled her these are not precancerous and would not be due for  another 10 years for her next exam based on current guidelines. She was anxious about waiting that long, with her history of breast cancer did not want to wait that long and for piece of mind wished to have it at the 5 year mark, in 2020. We can reassess her at that time for colonoscopy. She agreed.   Hessmer Cellar, MD [facility] Gastroenterology Pager [phone]

## 2016-09-03 ENCOUNTER — Telehealth: Payer: Self-pay | Admitting: *Deleted

## 2016-09-03 NOTE — Telephone Encounter (Signed)
Left vm for return call to discuss xrt. Contact information provided. Physician team notified unable to reach pt.

## 2016-09-16 ENCOUNTER — Encounter: Payer: Self-pay | Admitting: Cardiology

## 2016-09-30 ENCOUNTER — Encounter: Payer: Self-pay | Admitting: Cardiology

## 2016-09-30 ENCOUNTER — Ambulatory Visit (INDEPENDENT_AMBULATORY_CARE_PROVIDER_SITE_OTHER): Payer: BLUE CROSS/BLUE SHIELD | Admitting: Cardiology

## 2016-09-30 VITALS — BP 138/84 | HR 108 | Ht 66.0 in | Wt 258.4 lb

## 2016-09-30 DIAGNOSIS — G4730 Sleep apnea, unspecified: Secondary | ICD-10-CM

## 2016-09-30 DIAGNOSIS — E0500 Thyrotoxicosis with diffuse goiter without thyrotoxic crisis or storm: Secondary | ICD-10-CM

## 2016-09-30 DIAGNOSIS — E785 Hyperlipidemia, unspecified: Secondary | ICD-10-CM | POA: Diagnosis not present

## 2016-09-30 DIAGNOSIS — R0602 Shortness of breath: Secondary | ICD-10-CM

## 2016-09-30 DIAGNOSIS — R002 Palpitations: Secondary | ICD-10-CM | POA: Diagnosis not present

## 2016-09-30 DIAGNOSIS — E0590 Thyrotoxicosis, unspecified without thyrotoxic crisis or storm: Secondary | ICD-10-CM

## 2016-09-30 MED ORDER — METOPROLOL TARTRATE 50 MG PO TABS: ORAL_TABLET | ORAL | 3 refills | Status: DC

## 2016-09-30 NOTE — Patient Instructions (Signed)
Medication Instructions:  Increase metoprolol tartrate to 75mg  two times a day. This will be 1.5 mg of your 50mg  tablets two times a day.  Labwork: None   Testing/Procedures: None   Follow-Up: Your physician wants you to follow-up in: 1 year with Richardson Dopp, PA. (October 2018).  You will receive a reminder letter in the mail two months in advance. If you don't receive a letter, please call our office to schedule the follow-up appointment.       If you need a refill on your cardiac medications before your next appointment, please call your pharmacy.

## 2016-10-02 NOTE — Progress Notes (Signed)
Patient ID: Sophia Kelley, female   DOB: 12/28/1950, 65 y.o.   MRN: [id number] PCP: Dr Nilda Simmer  65 yo with history of OSA, obesity, and COPD presents for cardiology followup.  LHC/RHC in 11/16 showed no significant CAD and normal left and right heart filling pressures.  Subsequently, she was diagnosed with hyperthyroidism and was started on methimazole.    She is now off methimazole; I am not sure why.  She reports diaphoresis, fatigue, and a sensation of her heart racing. HR is 108 today, sinus tachycardia.  No chest pain.  Dyspnea walking up steps and inclines.  Not using CPAP.      Labs (5/16): K 5, creatinine 0.78, LDL 87, HDL 46 Labs (5/17): K 4.5, creatinine 0.8  ECG: sinus tachycardia, rate 108  Past Medical History: 1. HYPERLIPIDEMIA  2. COLONIC POLYPS, ADENOMATOUS 3. IRRITABLE BOWEL SYNDROME  4. HEMORRHOIDS  5. COLONIC INERTIA 6. COPD: Quit smoking in 2017 7. OSA: Severe.  Not currently using CPAP. 8. Obesity 9. Allergic rhinitis - RAST positive, IgE 1569 from Jan 23, 2011 10. Dyspnea - PFTs 02/03/11>>FEV1 2.64(107%), FEV1% 76, TLC 5.44(103%), DLCO 67%, no BD - Echo (1/12): EF 55-60%, mild LVH, grade I diastolic dysfunction, ? 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  - LHC/RHC (11/16) with no significant CAD; mean RA 7, PA 32/10 mean 21, mean PCWP 9, CI 2.44. 11. Hyperthyroidism: Graves disease.   12. Occipital neuralgia.  13. Lower extremity dopplers (3/17) with no significant PAD.  14. Breast cancer: s/p lumpectomy 2017.   Family History: Father - MI age 61 Brother - Prostate cancer Multiple relatives on father's side with early CAD.   Social History: Married. Unemployed. Started smoking age 65, quit in 2017. No significant alcohol use. Lives in [place].   ROS: All systems reviewed and negative except as per HPI.   Current Outpatient Prescriptions  Medication Sig Dispense Refill  .  HYDROcodone-acetaminophen (NORCO) 5-325 MG tablet Take 1-2 tablets by mouth every 6 (six) hours as needed. 30 tablet 0  . linaclotide (LINZESS) 290 MCG CAPS capsule Take 1 capsule (290 mcg total) by mouth daily. 90 capsule 3  . metoprolol (LOPRESSOR) 50 MG tablet Take 50 mg by mouth 2 (two) times daily.    . Multiple Vitamin (MULTIVITAMIN) tablet Take 1 tablet by mouth daily.    Marland Kitchen nystatin-triamcinolone (MYCOLOG II) cream Apply 1 application topically 2 (two) times daily.    . traMADol (ULTRAM) 50 MG tablet Take 1-2 tablets (50-100 mg total) by mouth every 6 (six) hours as needed. 30 tablet 1  . metoprolol (LOPRESSOR) 50 MG tablet 1.5 tablets (75mg ) two times a day 270 tablet 3   No current facility-administered medications for this visit.    BP 138/84   Pulse (!) 108   Ht 5\' 6"  (1.676 m)   Wt 258 lb 6.4 oz (117.2 kg)   LMP  (LMP Unknown)   BMI 41.71 kg/m  General: NAD, obese, diaphoretic. Neck: No JVD, diffusely enlarged thyroid gland.  Lungs: Clear to auscultation bilaterally with normal respiratory effort. CV: Nondisplaced PMI.  Heart mildly tachy, regular S1/S2, no S3/S4, no murmur.  No peripheral edema.  No carotid bruit.  Normal pedal pulses.  Abdomen: Soft, nontender, no hepatosplenomegaly, no distention.  Skin: Intact without lesions or rashes.  Neurologic: Alert and oriented x 3.  Psych: Normal affect. Extremities: No clubbing or cyanosis.  HEENT: Normal.   Assessment/Plan: 1.  Exertional chest pain/dyspnea: Left and right heart cath in 11/16 was normal. She has not had any further chest pain and still has stable dyspnea.   2. OSA: Severe, needs CPAP. I will try to schedule her for a CPAP titration.  3. COPD: She has quit smoking.  4. Hyperthyroidism: I think a lot of her symptomatology is due to hyperthyroidism => palpitations, diaphoresis, fatigue.  Symptoms have worsened since stopping methimazole.  She has not had RAI ablation yet.  She is followed by Dr Cruzita Lederer.   -  Increase metoprolol to 75 mg bid.  - Needs to see Dr Cruzita Lederer to either restart methimazole or get RAI ablation.   Followup in 1 year   Loralie Champagne 10/02/2016

## 2016-10-06 ENCOUNTER — Ambulatory Visit (INDEPENDENT_AMBULATORY_CARE_PROVIDER_SITE_OTHER): Payer: BLUE CROSS/BLUE SHIELD | Admitting: Internal Medicine

## 2016-10-06 VITALS — BP 140/92 | HR 105 | Wt 267.0 lb

## 2016-10-06 DIAGNOSIS — E0500 Thyrotoxicosis with diffuse goiter without thyrotoxic crisis or storm: Secondary | ICD-10-CM | POA: Diagnosis not present

## 2016-10-06 DIAGNOSIS — Z8639 Personal history of other endocrine, nutritional and metabolic disease: Secondary | ICD-10-CM | POA: Diagnosis not present

## 2016-10-06 DIAGNOSIS — Z23 Encounter for immunization: Secondary | ICD-10-CM | POA: Diagnosis not present

## 2016-10-06 LAB — T4, FREE: Free T4: 2.25 ng/dL — ABNORMAL HIGH (ref 0.60–1.60)

## 2016-10-06 LAB — TSH: TSH: 0.44 u[IU]/mL (ref 0.35–4.50)

## 2016-10-06 LAB — T3, FREE: T3, Free: 6.1 pg/mL — ABNORMAL HIGH (ref 2.3–4.2)

## 2016-10-06 MED ORDER — METHIMAZOLE 10 MG PO TABS: 10.0000 mg | ORAL_TABLET | Freq: Two times a day (BID) | ORAL | 1 refills | Status: DC

## 2016-10-06 NOTE — Progress Notes (Signed)
Patient ID: Sophia Kelley, female   DOB: Oct 13, 1951, 65 y.o.   MRN: [id number]   HPI  Sophia Kelley is a 65 y.o.-year-old female, initially referred by her cardiologist, Dr Aundra Dubin, now returning for Graves ds. Last visit 3 mo ago.  She has a dx with BrCa >> had mastectomy. Not undergoing radioactive treatment yet.   Reviewed and addended hx: Pt. has been dx with hypothyroidism in 2013; her TPO antibodies were found to be elevated, confirming Hashimoto thyroiditis. She was on Levothyroxine 137 mcg, now off for ~2 years.  She went to the hospital with SOB >> r/o PE and had a catheterization >> no CAD, normal EF. She had the cath on 11/01/2015.  Patient had thyrotoxic thyroid tests in 09/2015, after which she was referred to see me. By the time I saw her a month later her free T4 and free T3 were normal and her TSH was much better, therefore, we decided to recheck the thyroid tests in 1-1/2 months to see if they improve. They did not >> we checked an Uptake and scan:  Uptake and scan (01/28/2016):  There is uniform uptake within an enlarged thyroid gland no nodularity. The pyramidal lobe is faintly identified. 24 hour I 131 uptake = 38 .8% (normal 10-30%) IMPRESSION: Imaging and thyroid uptake are most suggestive of Graves disease.  We initially started MMI 5 mg daily, then increased to 5 mg bid on 03/09/2016 and to 10 mg bid in 03/2016, and 09-17-09 mg 3 times a day in 06/2016. After last visit, she read my MyChart message regarding increasing the MMI dose but she actually stopped the med (!!!) as she thought that the tx was not working anyway... In fact, she also stopped the metoprolol after she ran out around that time (!).  As expected, she is feeling worse lately.  Of note, the metoprolol was restarted last week by her cardiologist.  We discussed about RAI treatment at last visit and she agreed but wanted to wait until her BrCA tx was finished first.  I reviewed pt's thyroid  tests: Lab Results  Component Value Date   TSH 0.06 (L) 07/06/2016   TSH 0.15 (L) 04/02/2016   TSH 0.13 (L) 03/09/2016   TSH 0.27 (L) 01/03/2016   TSH 0.16 (L) 11/06/2015   TSH 2.66 07/02/2011   TSH 4.24 02/20/2011   TSH 2.94 01/23/2011   TSH 2.96 11/20/2010   FREET4 2.26 (H) 07/06/2016   FREET4 1.73 (H) 04/02/2016   FREET4 2.41 (H) 03/09/2016   FREET4 1.91 (H) 01/03/2016   FREET4 1.15 11/06/2015   FREET4 0.95 02/20/2011  11/06/2015: TSI antibodies 331 (<140%) 10/10/2015: TPO antibodies >1000 10/10/2015: TSH <0.006, total T3 3.3 (1-1.7), free T4 1.97 (0.82-1.77) - not on thyroid hormones at that time  02/20/2015: TSH 0.04, free T4 1.25  Pt still feels poorly: - + tremors - no palpitations  - + heat intolerance - + leg swelling and erythema - + fatigue - + Weight gain - + hoarseness  - + severe constipation - was seeing Dr. Olevia Perches - on Linzess - + dry skin, also rashes on arms - + hair loss  Pt denies feeling nodules in neck, + hoarseness, + occasional choking-not new, + dysphagia/no odynophagia, no SOB with lying down.  She has + FH of thyroid disorders in: mother, M aunts, all cousins - 2 relatives with Graves ds. No FH of thyroid cancer.  No h/o radiation tx to head or neck. No recent use of  iodine supplements.  She has wrist gout >> was on Prednisone several years ago, no steroids recently.  ROS: Constitutional: see HPI Eyes: + blurry vision, no xerophthalmia ENT: + see HPI Cardiovascular: no CP/SOB/ no palpitations/+ leg swelling Respiratory: no cough/SOB Gastrointestinal: no N/V/D/+C Musculoskeletal: no muscle/+ joint aches Skin: no rashes, + hair loss, + rash  Neurological:+ tremors/no numbness/tingling/dizziness, no HA  I reviewed pt's medications, allergies, PMH, social hx, family hx, and changes were documented in the history of present illness. Otherwise, unchanged from my initial visit note.  Past Medical History:  Diagnosis Date  . Arthritis     right arm  . Breast cancer of upper-outer quadrant of right female breast ([place]) 04/24/2016  . Colon polyps   . Complication of anesthesia    states was hard to wake up after 1 c-section  . Constipation   . Graves' disease   . Immature cataract   . Lumbar herniated disc   . Nasal congestion 05/08/2016  . Obesity   . Shortness of breath dyspnea    with ADLs - no home O2  . Sinus problem   . Sleep apnea    had sleep study, did not go back for CPAP fitting  . Tachycardia    states due to Graves' disease   Past Surgical History:  Procedure Laterality Date  . ABDOMINAL HYSTERECTOMY  1990   complete  . BREAST LUMPECTOMY WITH RADIOACTIVE SEED AND SENTINEL LYMPH NODE BIOPSY Right 05/15/2016   Procedure: RIGHT BREAST LUMPECTOMY WITH RADIOACTIVE SEED AND SENTINEL LYMPH NODE BIOPSY;  Surgeon: Autumn Messing III, MD;  Location: [facility];  Service: General;  Laterality: Right;  . CARDIAC CATHETERIZATION N/A 11/01/2015   Procedure: Right/Left Heart Cath and Coronary Angiography;  Surgeon: Larey Dresser, MD;  Location: [facility];  Service: Cardiovascular;  Laterality: N/A;  . CESAREAN SECTION  [id number]   x 2  . COLONOSCOPY WITH PROPOFOL  06/12/2014  . LAPAROSCOPIC APPENDECTOMY N/A 01/31/2014   Procedure: APPENDECTOMY LAPAROSCOPIC;  Surgeon: Harl Bowie, MD;  Location: [facility];  Service: General;  Laterality: N/A;  . NASAL SEPTUM SURGERY  1984  . RECTOCELE REPAIR  09/01/2007   Social History   Social History  . Marital status: Married    Spouse name: N/A  . Number of children: 2  . Years of education: 14   Occupational History  . unemployed   .  Unemployed   Social History Main Topics  . Smoking status: Former Smoker    Years: 40.00    Types: Cigarettes    Quit date: 05/14/2016  . Smokeless tobacco: Never Used     Comment: 5-6 cig./day  . Alcohol use No  . Drug use: No  . Sexual activity: Yes    Birth control/ protection: Surgical   Other Topics Concern   . Not on file   Social History Narrative   Caffeine Use - 1 cup coffee/day   Patient is right handed.         Current Outpatient Prescriptions on File Prior to Visit  Medication Sig Dispense Refill  . HYDROcodone-acetaminophen (NORCO) 5-325 MG tablet Take 1-2 tablets by mouth every 6 (six) hours as needed. 30 tablet 0  . linaclotide (LINZESS) 290 MCG CAPS capsule Take 1 capsule (290 mcg total) by mouth daily. 90 capsule 3  . metoprolol (LOPRESSOR) 50 MG tablet Take 50 mg by mouth 2 (two) times daily.    . metoprolol (LOPRESSOR) 50 MG tablet 1.5 tablets ('75mg'$ )  two times a day 270 tablet 3  . Multiple Vitamin (MULTIVITAMIN) tablet Take 1 tablet by mouth daily.    Marland Kitchen nystatin-triamcinolone (MYCOLOG II) cream Apply 1 application topically 2 (two) times daily.    . traMADol (ULTRAM) 50 MG tablet Take 1-2 tablets (50-100 mg total) by mouth every 6 (six) hours as needed. 30 tablet 1   No current facility-administered medications on file prior to visit.    Allergies  Allergen Reactions  . Codeine Rash   Family History  Problem Relation Age of Onset  . Heart attack Father 48    x3  . Allergies Father   . Arthritis Father   . Heart disease Father   . Prostate cancer Brother   . Thyroid disease Mother   . Hypertension Mother   . Arthritis Mother   . Osteoporosis Mother    PE: BP (!) 140/92 (BP Location: Left Arm, Patient Position: Sitting)   Pulse (!) 105   Wt 267 lb (121.1 kg)   LMP  (LMP Unknown)   SpO2 97%   BMI 43.09 kg/m  Body mass index is 43.09 kg/m. Wt Readings from Last 3 Encounters:  10/06/16 267 lb (121.1 kg)  09/30/16 258 lb 6.4 oz (117.2 kg)  09/01/16 262 lb (118.8 kg)   Constitutional: obese, in NAD, appears flushed Eyes: PERRLA, EOMI, no exophthalmos, no lid lag, no stare ENT: moist mucous membranes, no thyromegaly, "lumpy-bumpy" thyroid (prominent L lobe)no cervical lymphadenopathy Cardiovascular: tachycardia, RR, No MRG Respiratory: CTA  B Gastrointestinal: abdomen soft, NT, ND, BS+ Musculoskeletal: no deformities, strength intact in all 4 Skin: moist, warm, + rash ant lower legs (stasis dermatitis?) Neurological: + tremor with outstretched hands, DTR normal in all 4  ASSESSMENT: 1. Graves ds.  2. H/o Hashimoto's thyroiditis  PLAN:  1. And 2. Patient with h/o Hashimoto's hypothyroidism, off levothyroxine therapy for several years. However, in 2016, she developed tachycardia, heat intolerance and tremors. Her TPO ABs were >1000, so I assumed thyrotoxic phase of Hashimoto's thyroiditis. However, as TFTs did not improve, we checked a thyroid uptake and scan test in 01/2016 >> dx of Graves ds.  - We started her on methimazole 5 mg once a day, however, since the tests were not improving, we continued to increase the dose to 10 mg in am, 5 mg with lunch, and 10 mg with dinner while continuing Metoprolol. At last visit, we also discussed about RAI treatment and we decided to proceed with this. She ended up not having the RAI treatment as she thought that this would interact with the radiotherapy for breast cancer. - However, she did not understand the instructions at and after last visit >> actually stopped both MMI and Metoprolol (!!!) >> feels worse now. Her Metoprolol was restarted by her cardiologist and today we will also restart the Methimazole. We will also try to get her RAI tx to be done ASAP. - Will advise her to stop the methimazole 4 days prior to the RAI treatment.  - We need another set of TFTs in 1 month after RAI treatment. - we discussed that she may need LT4 tx after the treatment - I will see her back in 3 months  Orders Placed This Encounter  Procedures  . NM RAI Therapy For Hyperthyroidism  . Flu Vaccine QUAD 36+ mos PF IM (Fluarix & Fluzone Quad PF)  . T4, free  . T3, free  . TSH   Office Visit on 10/06/2016  Component Date Value Ref Range Status  .  Free T4 10/06/2016 2.25* 0.60 - 1.60 ng/dL Final    Comment: Specimens from patients who are undergoing biotin therapy and /or ingesting biotin supplements may contain high levels of biotin.  The higher biotin concentration in these specimens interferes with this Free T4 assay.  Specimens that contain high levels  of biotin may cause false high results for this Free T4 assay.  Please interpret results in light of the total clinical presentation of the patient.    . T3, Free 10/06/2016 6.1* 2.3 - 4.2 pg/mL Final  . TSH 10/06/2016 0.44  0.35 - 4.50 uIU/mL Final   Will continue methimazole 10 mg twice a day until she can have the RAI treatment.  Philemon Kingdom, MD PhD [facility] Endocrinology

## 2016-10-06 NOTE — Patient Instructions (Addendum)
Please continue Metoprolol 1.5 tablets 2x a day.  Please restart Methimazole 10 mg 2x a day.  You will be called with the RAI treatment schedule. Stop the Methimazole 4 days before the RAI treatment. Do not restart Methimazole after the RAI treatment but continue Metoprolol.  We need labs again 1 month after the RAI treatment.  Please stop at the lab.

## 2016-10-07 ENCOUNTER — Telehealth: Payer: Self-pay | Admitting: Internal Medicine

## 2016-10-07 ENCOUNTER — Telehealth: Payer: Self-pay | Admitting: *Deleted

## 2016-10-07 NOTE — Telephone Encounter (Signed)
Please give her the above information and let her decide.

## 2016-10-07 NOTE — Telephone Encounter (Signed)
Received a message wanting me to set this patient up for a CPAP Titration.    Home study was done in January 2017 - and per the sleep lab they will have to do a full repeated sleep study as it has been too long for just the titration  Also - patient has had 12 cancellations in the past 5 years (just for sleep studies and titrations) and there is a note from the sleep lab that if this test is rescheduled, this is the LAST appointment that she can have with the sleep lab. They will not reschedule AND if the patient cancels or does not show up she will be billed a $200 fee.   Do we still want to proceed with repeat sleep study?    If so, patient will need to be notified of the details above.   Routed to Provider/Nurse to advise.

## 2016-10-07 NOTE — Telephone Encounter (Signed)
Sophia Kelley need the Dr or nurse to call her about Sophia Kelley, Sophia Kelley y.o., [birth year]  MRN [id number]  Regarding her  NM RAI THERAPY treatment  Please contact Sophia Kelley at  [facility]  Radiology at [phone]

## 2016-10-08 ENCOUNTER — Encounter: Payer: Self-pay | Admitting: Internal Medicine

## 2016-10-08 ENCOUNTER — Telehealth: Payer: Self-pay

## 2016-10-08 NOTE — Telephone Encounter (Signed)
Called and spoke with Museum/gallery conservator at [employer],  Dr.Gherghe spoke with Dr.Edmunds about going ahead with the Tx, without doing the uptake. She was notified and got the okay to proceed. No other questions at this time.

## 2016-10-08 NOTE — Telephone Encounter (Signed)
Almyra Free, can you please contact Amber to see what Q's she has and let me know. You can get me out of her patient room so we can get her scheduling going ASAP.

## 2016-10-08 NOTE — Telephone Encounter (Signed)
Patient stated that at this time she does not feel like the timing is right to schedule this test.  She stated that she is getting ready to undergo different treatments for other things and she does not want to risk having to cancel then being charged the fee and not being able to reschedule.    I let her know that I would document this, and whenever she is ready she just needs to call our office and let us know.  If/When she does call back we need to put in an order for a split night study and schedule her with the sleep lab to have this done.   Patient stated verbal understanding and said she would call when she is ready to schedule.  Routed to MD/Nurse just as Juluis Rainier

## 2016-10-15 ENCOUNTER — Ambulatory Visit (HOSPITAL_COMMUNITY)
Admission: RE | Admit: 2016-10-15 | Discharge: 2016-10-15 | Disposition: A | Discharge status: Home / Self Care | Payer: BLUE CROSS/BLUE SHIELD | Source: Ambulatory Visit | Attending: Internal Medicine | Admitting: Internal Medicine

## 2016-10-15 DIAGNOSIS — E0500 Thyrotoxicosis with diffuse goiter without thyrotoxic crisis or storm: Secondary | ICD-10-CM | POA: Insufficient documentation

## 2016-10-15 MED ORDER — FLUDEOXYGLUCOSE F - 18 (FDG) INJECTION
18.7600 | Freq: Once | INTRAVENOUS | Status: AC | PRN
  Administered 2016-10-15: 18.76 via INTRAVENOUS

## 2016-10-19 ENCOUNTER — Telehealth: Payer: Self-pay

## 2016-10-19 ENCOUNTER — Telehealth: Payer: Self-pay | Admitting: Internal Medicine

## 2016-10-19 NOTE — Telephone Encounter (Signed)
Continue find out more details about how she feels? We need to put her on the waiting list for now. I'll keep an eye open for any cancellations.

## 2016-10-19 NOTE — Telephone Encounter (Signed)
Pt had RAI on 11/2 she is having some issues and is not feeling well. Pt is asking if we can work her into a day to be seen soon, please advise

## 2016-10-19 NOTE — Telephone Encounter (Signed)
Patient states before her RAI treatment she was taking the methimazole, which they advised her to stop taking before the RAI, she states she was feeling very good while on the medication, and as soon as she stopped taking it that when the problems came back. She is shaking, heart racing, and sweating. She has not restarted the medication as they advised her not to yet. She will wait for her an appointment if she can be on the wait list.

## 2016-10-21 ENCOUNTER — Encounter: Payer: Self-pay | Admitting: Internal Medicine

## 2016-10-21 ENCOUNTER — Other Ambulatory Visit: Payer: BLUE CROSS/BLUE SHIELD

## 2016-10-21 ENCOUNTER — Ambulatory Visit (INDEPENDENT_AMBULATORY_CARE_PROVIDER_SITE_OTHER): Payer: BLUE CROSS/BLUE SHIELD | Admitting: Internal Medicine

## 2016-10-21 VITALS — BP 130/76 | HR 113 | Ht 66.0 in | Wt 259.2 lb

## 2016-10-21 DIAGNOSIS — Z8639 Personal history of other endocrine, nutritional and metabolic disease: Secondary | ICD-10-CM

## 2016-10-21 DIAGNOSIS — E0500 Thyrotoxicosis with diffuse goiter without thyrotoxic crisis or storm: Secondary | ICD-10-CM

## 2016-10-21 MED ORDER — METHIMAZOLE 5 MG PO TABS: 5.0000 mg | ORAL_TABLET | Freq: Two times a day (BID) | ORAL | 1 refills | Status: DC

## 2016-10-21 NOTE — Progress Notes (Signed)
Patient ID: Sophia Kelley, female   DOB: 1951-07-06, 65 y.o.   MRN: [id number]   HPI  Sophia Kelley is a 65 y.o.-year-old female, initially referred by her cardiologist, Dr Aundra Dubin, now returning for f/u for Graves ds. Last visit 2 weeks ago. This is an urgent visit >> pt feeling poorly after her RAI tx on 10/15/2016.  Reviewed and addended hx: Pt. has been dx with hypothyroidism in 2013; her TPO antibodies were found to be elevated, confirming Hashimoto thyroiditis. She was on Levothyroxine 137 mcg, now off for ~2 years.  She went to the hospital with SOB >> r/o PE and had a catheterization >> no CAD, normal EF. She had the cath on 11/01/2015.  Patient had thyrotoxic thyroid tests in 09/2015, after which she was referred to see me. By the time I saw her a month later her free T4 and free T3 were normal and her TSH was much better, therefore, we decided to recheck the thyroid tests in 1-1/2 months to see if they improve. They did not >> we checked an Uptake and scan:  Uptake and scan (01/28/2016):  There is uniform uptake within an enlarged thyroid gland no nodularity. The pyramidal lobe is faintly identified. 24 hour I 131 uptake = 38 .8% (normal 10-30%) IMPRESSION: Imaging and thyroid uptake are most suggestive of Graves disease.  We initially started MMI 5 mg daily, then increased to 5 mg bid on 03/09/2016 and to 10 mg bid in 03/2016, and 09-17-09 mg 3 times a day in 06/2016. After last visit, she read my MyChart message regarding increasing the MMI dose but she actually stopped the med (!!!) as she thought that the tx was not working anyway... In fact, she also stopped the metoprolol after she ran out around that time (!) >> restarted last mo (75 mg 2x a day). We restarted MMi 10 mg bid at last visit and also scheduled her RAI tx.  She had he RAI tx on 10/15/2016. For the treatment, she had to stop the methimazole 4 days in advance. She started to feel poorly after she stopped methimazole  and now she continues to have increased heat intolerance with sweating, tremors, palpitations. She continues metoprolol twice a day. She is wondering whether she needs to restart the methimazole.  I reviewed pt's thyroid tests: Lab Results  Component Value Date   TSH 0.44 10/06/2016   TSH 0.06 (L) 07/06/2016   TSH 0.15 (L) 04/02/2016   TSH 0.13 (L) 03/09/2016   TSH 0.27 (L) 01/03/2016   TSH 0.16 (L) 11/06/2015   TSH 2.66 07/02/2011   TSH 4.24 02/20/2011   TSH 2.94 01/23/2011   TSH 2.96 11/20/2010   FREET4 2.25 (H) 10/06/2016   FREET4 2.26 (H) 07/06/2016   FREET4 1.73 (H) 04/02/2016   FREET4 2.41 (H) 03/09/2016   FREET4 1.91 (H) 01/03/2016   FREET4 1.15 11/06/2015   FREET4 0.95 02/20/2011  11/06/2015: TSI antibodies 331 (<140%) 10/10/2015: TPO antibodies >1000 10/10/2015: TSH <0.006, total T3 3.3 (1-1.7), free T4 1.97 (0.82-1.77) - not on thyroid hormones at that time  02/20/2015: TSH 0.04, free T4 1.25  She has + FH of thyroid disorders in: mother, M aunts, all cousins - 2 relatives with Graves ds. No FH of thyroid cancer.  No h/o radiation tx to head or neck. No recent use of iodine supplements.  She has wrist gout >> was on Prednisone several years ago, no steroids recently.  ROS: Constitutional: see HPI Eyes: no blurry vision,  no xerophthalmia ENT: + Hoarseness. No dysphagia. Cardiovascular: no CP/SOB/ + palpitations/no leg swelling Respiratory: no cough/SOB Gastrointestinal: + N/no V/D/C Musculoskeletal: no muscle/+ joint aches Skin: + rash - face Neurological:+ tremors/no numbness/tingling/dizziness, no HA  I reviewed pt's medications, allergies, PMH, social hx, family hx, and changes were documented in the history of present illness. Otherwise, unchanged from my initial visit note.  Past Medical History:  Diagnosis Date  . Arthritis    right arm  . Breast cancer of upper-outer quadrant of right female breast ([place]) 04/24/2016  . Colon polyps   . Complication of  anesthesia    states was hard to wake up after 1 c-section  . Constipation   . Graves' disease   . Immature cataract   . Lumbar herniated disc   . Nasal congestion 05/08/2016  . Obesity   . Shortness of breath dyspnea    with ADLs - no home O2  . Sinus problem   . Sleep apnea    had sleep study, did not go back for CPAP fitting  . Tachycardia    states due to Graves' disease   Past Surgical History:  Procedure Laterality Date  . ABDOMINAL HYSTERECTOMY  1990   complete  . BREAST LUMPECTOMY WITH RADIOACTIVE SEED AND SENTINEL LYMPH NODE BIOPSY Right 05/15/2016   Procedure: RIGHT BREAST LUMPECTOMY WITH RADIOACTIVE SEED AND SENTINEL LYMPH NODE BIOPSY;  Surgeon: Autumn Messing III, MD;  Location: [facility];  Service: General;  Laterality: Right;  . CARDIAC CATHETERIZATION N/A 11/01/2015   Procedure: Right/Left Heart Cath and Coronary Angiography;  Surgeon: Larey Dresser, MD;  Location: [place];  Service: Cardiovascular;  Laterality: N/A;  . CESAREAN SECTION  [id number]   x 2  . COLONOSCOPY WITH PROPOFOL  06/12/2014  . LAPAROSCOPIC APPENDECTOMY N/A 01/31/2014   Procedure: APPENDECTOMY LAPAROSCOPIC;  Surgeon: Harl Bowie, MD;  Location[facility];  Service: General;  Laterality: N/A;  . NASAL SEPTUM SURGERY  1984  . RECTOCELE REPAIR  09/01/2007   Social History   Social History  . Marital status: Married    Spouse name: N/A  . Number of children: 2  . Years of education: 14   Occupational History  . unemployed   .  Unemployed   Social History Main Topics  . Smoking status: Former Smoker    Years: 40.00    Types: Cigarettes    Quit date: 05/14/2016  . Smokeless tobacco: Never Used     Comment: 5-6 cig./day  . Alcohol use No  . Drug use: No  . Sexual activity: Yes    Birth control/ protection: Surgical   Other Topics Concern  . Not on file   Social History Narrative   Caffeine Use - 1 cup coffee/day   Patient is right handed.         Current  Outpatient Prescriptions on File Prior to Visit  Medication Sig Dispense Refill  . linaclotide (LINZESS) 290 MCG CAPS capsule Take 1 capsule (290 mcg total) by mouth daily. 90 capsule 3  . methimazole (TAPAZOLE) 10 MG tablet Take 1 tablet (10 mg total) by mouth 2 (two) times daily. 60 tablet 1  . metoprolol (LOPRESSOR) 50 MG tablet 1.5 tablets (75mg ) two times a day 270 tablet 3  . Multiple Vitamin (MULTIVITAMIN) tablet Take 1 tablet by mouth daily.    . naproxen (NAPROSYN) 500 MG tablet Take 500 mg by mouth 2 (two) times daily. for 10 days  0  . nystatin-triamcinolone (  MYCOLOG II) cream Apply 1 application topically 2 (two) times daily.    Marland Kitchen HYDROcodone-acetaminophen (NORCO) 5-325 MG tablet Take 1-2 tablets by mouth every 6 (six) hours as needed. (Patient not taking: Reported on 10/21/2016) 30 tablet 0  . traMADol (ULTRAM) 50 MG tablet Take 1-2 tablets (50-100 mg total) by mouth every 6 (six) hours as needed. (Patient not taking: Reported on 10/21/2016) 30 tablet 1   No current facility-administered medications on file prior to visit.    Allergies  Allergen Reactions  . Codeine Rash   Family History  Problem Relation Age of Onset  . Heart attack Father 48    x3  . Allergies Father   . Arthritis Father   . Heart disease Father   . Prostate cancer Brother   . Thyroid disease Mother   . Hypertension Mother   . Arthritis Mother   . Osteoporosis Mother    PE: BP 130/76 (BP Location: Left Arm, Patient Position: Sitting, Cuff Size: Large)   Pulse (!) 113   Ht 5\' 6"  (1.676 m)   Wt 259 lb 3.2 oz (117.6 kg)   LMP  (LMP Unknown)   SpO2 96%   BMI 41.84 kg/m  Body mass index is 41.84 kg/m. Wt Readings from Last 3 Encounters:  10/21/16 259 lb 3.2 oz (117.6 kg)  10/06/16 267 lb (121.1 kg)  09/30/16 258 lb 6.4 oz (117.2 kg)   Constitutional: obese, in NAD, appears flushed Eyes: PERRLA, EOMI, no exophthalmos, no lid lag, no stare ENT: moist mucous membranes, no thyromegaly, "lumpy-bumpy"  thyroid (prominent L lobe)no cervical lymphadenopathy Cardiovascular: tachycardia, RR, No MRG Respiratory: CTA B Gastrointestinal: abdomen soft, NT, ND, BS+ Musculoskeletal: no deformities, strength intact in all 4 Skin: moist, warm Neurological: + tremor with outstretched hands, DTR normal in all 4  ASSESSMENT: 1. Graves ds.  2. H/o Hashimoto's thyroiditis  PLAN:  1. And 2. Patient with h/o Hashimoto's hypothyroidism, off levothyroxine therapy for several years. However, in 2016, she developed tachycardia, heat intolerance and tremors. Her TPO ABs were >1000, so I assumed thyrotoxic phase of Hashimoto's thyroiditis. However, as TFTs did not improve, we checked a thyroid uptake and scan test in 01/2016 >> dx of Graves ds. We started her on methimazole and Metoprolol, but she did not respond well to methimazole, so I recommended RAI treatment , which she had 6 days ago. She started to develop hyperthyroid symptoms after stopping the methimazole prior to the procedure. I explained that RAI treatment can cause inflammation in the thyroid with subsequent release of preformed thyroid hormones in blood. This is a self-limited process, and should last for approximately 1 month after her RAI and most likely she will then become hypothyroid. However, since she is so symptomatic today, will start the low-dose methimazole, 5 mg twice a day. I advised her to let me know in approximately a week about how she feels, we may need to switch to PTU, which has more capability to inhibit the T4 toT3 transition compared to methimazole. - We need another set of TFTs in 1 month. - we again discussed that she will probably need LT4 tx in the near future - I will see her back in 2 months  Philemon Kingdom, MD PhD [facility] Endocrinology

## 2016-10-21 NOTE — Patient Instructions (Signed)
Please restart Methimazole 5 mg 2x a day.  Come back for labs in 4 weeks.  Come back for an appt in 2 months.

## 2016-12-21 ENCOUNTER — Ambulatory Visit: Payer: BLUE CROSS/BLUE SHIELD | Admitting: Internal Medicine

## 2016-12-31 ENCOUNTER — Ambulatory Visit: Payer: Self-pay | Admitting: Rheumatology

## 2017-01-01 ENCOUNTER — Ambulatory Visit (INDEPENDENT_AMBULATORY_CARE_PROVIDER_SITE_OTHER): Payer: Medicare HMO | Admitting: Internal Medicine

## 2017-01-01 VITALS — BP 130/80 | HR 105 | Wt 275.0 lb

## 2017-01-01 DIAGNOSIS — Z8639 Personal history of other endocrine, nutritional and metabolic disease: Secondary | ICD-10-CM | POA: Diagnosis not present

## 2017-01-01 DIAGNOSIS — E0500 Thyrotoxicosis with diffuse goiter without thyrotoxic crisis or storm: Secondary | ICD-10-CM

## 2017-01-01 DIAGNOSIS — E890 Postprocedural hypothyroidism: Secondary | ICD-10-CM

## 2017-01-01 NOTE — Progress Notes (Signed)
[name] ID: Sophia Kelley, female   DOB: 05/29/1951, 66 y.o.   MRN: [id number]   HPI  DANIELLIE Kelley is a 66 y.o.-year-old female, initially referred by her cardiologist, Dr Aundra Dubin, now returning for f/u for Graves ds. Last visit 2 mo ago.  Patient had RAI tx for Graves' disease on 10/15/2016. She started to feel poorly afterwards (increased heat intolerance with sweating, tremors, palpitations). She continues metoprolol twice a day >> I saw that 2 weeks after the RAI treatment and we started back methimazole 5 mg twice a day >> now on 5 mg daily.    She is on Metoprolol 50 mg once a day, not 75 mg 2x a day as recommended by her cardiologist as she was concerned that this is causing her constipation.  She c/o feeling in "slow motion", fatigued, constipation, weight gain.  Reviewed and addended hx: Pt. has been dx with hypothyroidism in 2013; her TPO antibodies were found to be elevated, confirming Hashimoto thyroiditis. She was on Levothyroxine 137 mcg, now off for ~2 years.  She went to the hospital with SOB >> r/o PE and had a catheterization >> no CAD, normal EF. She had the cath on 11/01/2015.  Patient had thyrotoxic thyroid tests in 09/2015, after which she was referred to see me. By the time I saw her a month later her free T4 and free T3 were normal and her TSH was much better, therefore, we decided to recheck the thyroid tests in 1-1/2 months to see if they improve. They did not >> we checked an Uptake and scan:  Uptake and scan (01/28/2016):  There is uniform uptake within an enlarged thyroid gland no nodularity. The pyramidal lobe is faintly identified. 24 hour I 131 uptake = 38.8% (normal 10-30%) IMPRESSION: Imaging and thyroid uptake are most suggestive of Graves disease.  I reviewed pt's thyroid tests: Lab Results  Component Value Date   TSH 0.44 10/06/2016   TSH 0.06 (L) 07/06/2016   TSH 0.15 (L) 04/02/2016   TSH 0.13 (L) 03/09/2016   TSH 0.27 (L) 01/03/2016   TSH 0.16 (L) 11/06/2015   TSH 2.66 07/02/2011   TSH 4.24 02/20/2011   TSH 2.94 01/23/2011   TSH 2.96 11/20/2010   FREET4 2.25 (H) 10/06/2016   FREET4 2.26 (H) 07/06/2016   FREET4 1.73 (H) 04/02/2016   FREET4 2.41 (H) 03/09/2016   FREET4 1.91 (H) 01/03/2016   FREET4 1.15 11/06/2015   FREET4 0.95 02/20/2011  11/06/2015: TSI antibodies 331 (<140%) 10/10/2015: TPO antibodies >1000 10/10/2015: TSH <0.006, total T3 3.3 (1-1.7), free T4 1.97 (0.82-1.77) - not on thyroid hormones at that time  02/20/2015: TSH 0.04, free T4 1.25  She has + FH of thyroid disorders in: mother, M aunts, all cousins - 2 relatives with Graves ds. No FH of thyroid cancer.  No h/o radiation tx to head or neck. No recent use of iodine supplements.  She has wrist gout >> was on Prednisone several years ago, no steroids recently.  ROS: Constitutional: see HPI Eyes: + blurry vision, no xerophthalmia ENT: + Hoarseness. No dysphagia. Cardiovascular: no CP/SOB/ no palpitations/+ leg swelling Respiratory: no cough/SOB Gastrointestinal: no N/V/D/+++C Musculoskeletal: + muscle/+ joint aches Skin: no rash, + hair loss Neurological:+ tremors/no numbness/tingling/dizziness, no HA  Ophthalmologist: Dr. Ellie Lunch.  I reviewed pt's medications, allergies, PMH, social hx, family hx, and changes were documented in the history of present illness. Otherwise, unchanged from my initial visit note.  Past Medical History:  Diagnosis Date  . Arthritis  right arm  . Breast cancer of upper-outer quadrant of right female breast ([place]) 04/24/2016  . Colon polyps   . Complication of anesthesia    states was hard to wake up after 1 c-section  . Constipation   . Graves' disease   . Immature cataract   . Lumbar herniated disc   . Nasal congestion 05/08/2016  . Obesity   . Shortness of breath dyspnea    with ADLs - no home O2  . Sinus problem   . Sleep apnea    had sleep study, did not go back for CPAP fitting  . Tachycardia     states due to Graves' disease   Past Surgical History:  Procedure Laterality Date  . ABDOMINAL HYSTERECTOMY  1990   complete  . BREAST LUMPECTOMY WITH RADIOACTIVE SEED AND SENTINEL LYMPH NODE BIOPSY Right 05/15/2016   Procedure: RIGHT BREAST LUMPECTOMY WITH RADIOACTIVE SEED AND SENTINEL LYMPH NODE BIOPSY;  Surgeon: Autumn Messing III, MD;  Location: [facility];  Service: General;  Laterality: Right;  . CARDIAC CATHETERIZATION N/A 11/01/2015   Procedure: Right/Left Heart Cath and Coronary Angiography;  Surgeon: Larey Dresser, MD;  Location: [facility];  Service: Cardiovascular;  Laterality: N/A;  . CESAREAN SECTION  [id number]   x 2  . COLONOSCOPY WITH PROPOFOL  06/12/2014  . LAPAROSCOPIC APPENDECTOMY N/A 01/31/2014   Procedure: APPENDECTOMY LAPAROSCOPIC;  Surgeon: Harl Bowie, MD;  Location: [facility];  Service: General;  Laterality: N/A;  . NASAL SEPTUM SURGERY  1984  . RECTOCELE REPAIR  09/01/2007   Social History   Social History  . Marital status: Married    Spouse name: N/A  . Number of children: 2  . Years of education: 14   Occupational History  . unemployed   .  Unemployed   Social History Main Topics  . Smoking status: Former Smoker    Years: 40.00    Types: Cigarettes    Quit date: 05/14/2016  . Smokeless tobacco: Never Used     Comment: 5-6 cig./day  . Alcohol use No  . Drug use: No  . Sexual activity: Yes    Birth control/ protection: Surgical   Other Topics Concern  . Not on file   Social History Narrative   Caffeine Use - 1 cup coffee/day   Patient is right handed.         Current Outpatient Prescriptions on File Prior to Visit  Medication Sig Dispense Refill  . HYDROcodone-acetaminophen (NORCO) 5-325 MG tablet Take 1-2 tablets by mouth every 6 (six) hours as needed. (Patient not taking: Reported on 10/21/2016) 30 tablet 0  . linaclotide (LINZESS) 290 MCG CAPS capsule Take 1 capsule (290 mcg total) by mouth daily. 90 capsule 3  .  methimazole (TAPAZOLE) 5 MG tablet Take 1 tablet (5 mg total) by mouth 2 (two) times daily. 30 tablet 1  . metoprolol (LOPRESSOR) 50 MG tablet 1.5 tablets (75mg ) two times a day 270 tablet 3  . Multiple Vitamin (MULTIVITAMIN) tablet Take 1 tablet by mouth daily.    . naproxen (NAPROSYN) 500 MG tablet Take 500 mg by mouth 2 (two) times daily. for 10 days  0  . nystatin-triamcinolone (MYCOLOG II) cream Apply 1 application topically 2 (two) times daily.    . traMADol (ULTRAM) 50 MG tablet Take 1-2 tablets (50-100 mg total) by mouth every 6 (six) hours as needed. (Patient not taking: Reported on 10/21/2016) 30 tablet 1   No current facility-administered medications on  file prior to visit.    Allergies  Allergen Reactions  . Codeine Rash   Family History  Problem Relation Age of Onset  . Heart attack Father 48    x3  . Allergies Father   . Arthritis Father   . Heart disease Father   . Prostate cancer Brother   . Thyroid disease Mother   . Hypertension Mother   . Arthritis Mother   . Osteoporosis Mother    PE: BP 130/80 (BP Location: Left Arm, Patient Position: Sitting)   Pulse (!) 105   Wt 275 lb (124.7 kg)   LMP  (LMP Unknown)   SpO2 95%   BMI 44.39 kg/m  Body mass index is 44.39 kg/m. Wt Readings from Last 3 Encounters:  01/01/17 275 lb (124.7 kg)  10/21/16 259 lb 3.2 oz (117.6 kg)  10/06/16 267 lb (121.1 kg)   Constitutional: obese, in NAD Eyes: PERRLA, EOMI, no exophthalmos, no lid lag, no stare ENT: moist mucous membranes, no thyromegaly, "lumpy-bumpy" thyroid (prominent L lobe); no cervical lymphadenopathy Cardiovascular: tachycardia, RR, No MRG Respiratory: CTA B Gastrointestinal: abdomen soft, NT, ND, BS+ Musculoskeletal: no deformities, strength intact in all 4 Skin: moist, warm Neurological: + tremor with outstretched hands, DTR normal in all 4  ASSESSMENT: 1. Graves ds.  2. H/o Hashimoto's thyroiditis  PLAN:  1. And 2. Patient with h/o Hashimoto's  hypothyroidism, off levothyroxine therapy for several years. However, in 2016, she developed tachycardia, heat intolerance and tremors. Her TPO ABs were >1000, so I assumed thyrotoxic phase of Hashimoto's thyroiditis. However, a thyroid uptake and scan test in 01/2016 >> dx of Graves ds. We started her on methimazole and Metoprolol, but she did not respond well to methimazole, so she ended up having RAI treatment in 10/2016. She started to develop hyperthyroid symptoms after stopping the methimazole 4 days prior to the procedure likely 2/2 RAI-induced inflammation in the thyroid with subsequent release of preformed thyroid hormones in blood. This is a self-limited process however, since she was so symptomatic at last visit, we started a low-dose methimazole, 5 mg twice a day >> now decreased to once a day.  - She is now feeling poorly now with hypothyroid sxs: constipation, wt gain (16 lbs in a little more than 2 mo), weakness, fatigue >> will advise her to stop MMI. Will also advise her to go back on the higher beta blocker dose (75 mg bid) as she has tachycardia. - Will check TFTs today  - we again discussed that she will probably need LT4 tx in the near future - I will see her back in 3 months  PT needs to be called with results!  Component     Latest Ref Rng & Units 01/01/2017  Triiodothyronine,Free,Serum     2.3 - 4.2 pg/mL 1.5 (L)  T4,Free(Direct)     0.60 - 1.60 ng/dL 0.31 (L)  TSH     0.35 - 4.50 uIU/mL 57.47 Repeated and verified X2. (H)   Patient now hypothyroid. We already stopped the methimazole, but will also need to start levothyroxine 25 g daily for the next week and then increase to 50 g daily for 5 weeks. We'll repeat her TFTs then.  Philemon Kingdom, MD PhD [facility]

## 2017-01-01 NOTE — Patient Instructions (Addendum)
Please stop at the lab.  Please stop Methimazole.  Please increase Lopressor back to 75 mg 2x a day.  Please return in 3 months.

## 2017-01-01 NOTE — Progress Notes (Signed)
Office Visit Note  Patient: Sophia Kelley             Date of Birth: 1951/07/17           MRN: [id number]             PCP: Nilda Simmer, MD Referring: Delilah Shan, MD Visit Date: 01/02/2017 Occupation: Retired    Subjective:  Pain in multiple joint   History of Present Illness: Sophia Kelley is a 66 y.o. female seen in consultation for evaluation of polyarthralgia. According to patient her symptoms started about 4 years ago with pain in multiple joints and popping. She has seen physicians in between but to have her symptoms were minor. She states in April 2017 when she had her physical with her PCP he got concerned and advised that she should see a rheumatologist. She complains of pain and discomfort in her neck and lower back, right shoulder, bilateral hands, bilateral knee joints. She states she has pedal edema causes discomfort as well.   Activities of Daily Living:  Patient reports morning stiffness for 5 minutes.   Patient Reports nocturnal pain.  Difficulty dressing/grooming: Reports Difficulty climbing stairs: Reports Difficulty getting out of chair: Reports Difficulty using hands for taps, buttons, cutlery, and/or writing: Denies   Review of Systems  Constitutional: Positive for fatigue. Negative for night sweats, weight gain, weight loss and weakness.  HENT: Negative for mouth sores, trouble swallowing, trouble swallowing, mouth dryness and nose dryness.   Eyes: Negative for pain, redness, visual disturbance and dryness.  Respiratory: Negative for cough, shortness of breath and difficulty breathing.   Cardiovascular: Negative for chest pain, palpitations, hypertension, irregular heartbeat and swelling in legs/feet.  Gastrointestinal: Positive for constipation. Negative for blood in stool and diarrhea.  Endocrine: Negative for increased urination.  Genitourinary: Negative for vaginal dryness.  Musculoskeletal: Positive for arthralgias, joint pain and morning  stiffness. Negative for joint swelling, myalgias, muscle weakness, muscle tenderness and myalgias.  Skin: Positive for rash. Negative for color change, hair loss, skin tightness, ulcers and sensitivity to sunlight.       Left inguinal, yeast infection  Allergic/Immunologic: Negative for susceptible to infections.  Neurological: Negative for dizziness, memory loss and night sweats.  Hematological: Negative for swollen glands.  Psychiatric/Behavioral: Positive for sleep disturbance. Negative for depressed mood. The patient is not nervous/anxious.     PMFS History:  Patient Active Problem List   Diagnosis Date Noted  . Breast cancer of upper-outer quadrant of right female breast ([place]) 04/24/2016  . Graves disease 04/02/2016  . Sinus tachycardia 03/15/2016  . Hx of Hashimoto thyroiditis 01/03/2016  . Angina pectoris ([place]) 10/19/2015  . Occipital neuralgia of right side 05/15/2015  . Chronic constipation 02/01/2014  . Acute appendicitis 01/31/2014  . Other screening mammogram 07/23/2011  . Vitamin B12 deficiency 07/23/2011  . Chest pain, atypical 07/23/2011  . HYPOTHYROIDISM 02/20/2011  . Sleep apnea 02/20/2011  . ALLERGIC RHINITIS 02/03/2011  . LOW BACK PAIN, CHRONIC 01/02/2011  . Morbid obesity ([place]) 11/20/2010  . TOBACCO USE 11/20/2010  . PVD 11/20/2010  . Hyperlipidemia 10/29/2009  . CONSTIPATION, CHRONIC 10/29/2009  . IRRITABLE BOWEL SYNDROME 10/29/2009    Past Medical History:  Diagnosis Date  . Arthritis    right arm  . Breast cancer of upper-outer quadrant of right female breast ([place]) 04/24/2016  . Colon polyps   . Complication of anesthesia    states was hard to wake up after 1 c-section  . Constipation   .  Graves' disease   . Immature cataract   . Lumbar herniated disc   . Nasal congestion 05/08/2016  . Obesity   . Shortness of breath dyspnea    with ADLs - no home O2  . Sinus problem   . Sleep apnea    had sleep study, did not go back for CPAP fitting  .  Tachycardia    states due to Graves' disease    Family History  Problem Relation Age of Onset  . Heart attack Father 48    x3  . Allergies Father   . Arthritis Father   . Heart disease Father   . Prostate cancer Brother   . Thyroid disease Mother   . Hypertension Mother   . Arthritis Mother   . Osteoporosis Mother    Past Surgical History:  Procedure Laterality Date  . ABDOMINAL HYSTERECTOMY  1990   complete  . BREAST LUMPECTOMY WITH RADIOACTIVE SEED AND SENTINEL LYMPH NODE BIOPSY Right 05/15/2016   Procedure: RIGHT BREAST LUMPECTOMY WITH RADIOACTIVE SEED AND SENTINEL LYMPH NODE BIOPSY;  Surgeon: Autumn Messing III, MD;  Location: [facility];  Service: General;  Laterality: Right;  . CARDIAC CATHETERIZATION N/A 11/01/2015   Procedure: Right/Left Heart Cath and Coronary Angiography;  Surgeon: Larey Dresser, MD;  Location: [place];  Service: Cardiovascular;  Laterality: N/A;  . CESAREAN SECTION  [id number]   x 2  . COLONOSCOPY WITH PROPOFOL  06/12/2014  . LAPAROSCOPIC APPENDECTOMY N/A 01/31/2014   Procedure: APPENDECTOMY LAPAROSCOPIC;  Surgeon: Harl Bowie, MD;  Location: [facility];  Service: General;  Laterality: N/A;  . NASAL SEPTUM SURGERY  1984  . RECTOCELE REPAIR  09/01/2007   Social History   Social History Narrative   Caffeine Use - 1 cup coffee/day   Patient is right handed.           Objective: Vital Signs: BP (!) 144/89 (BP Location: Left Arm, Patient Position: Sitting)   Pulse 91   Resp 14   Ht '5\' 6"'$  (1.676 m)   Wt 275 lb (124.7 kg)   LMP  (LMP Unknown)   BMI 44.39 kg/m    Physical Exam  Constitutional: She is oriented to person, place, and time. She appears well-developed and well-nourished.  HENT:  Head: Normocephalic and atraumatic.  Eyes: Conjunctivae and EOM are normal.  Neck: Normal range of motion.  Cardiovascular: Normal rate, regular rhythm, normal heart sounds and intact distal pulses.   Pulmonary/Chest: Effort normal  and breath sounds normal.  Abdominal: Soft. Bowel sounds are normal.  Lymphadenopathy:    She has no cervical adenopathy.  Neurological: She is alert and oriented to person, place, and time.  Skin: Skin is warm and dry. Capillary refill takes less than 2 seconds.  Psychiatric: She has a normal mood and affect. Her behavior is normal.  Nursing note and vitals reviewed.    Musculoskeletal Exam: C-spine limited range of motion., Thoracic spine she has some kyphosis and limited range of motion. Lumbar spine limited range of motion. No SI joint tenderness. She is painful range of motion of her right shoulder joint with discomfort on abduction. Elbow joints wrist joints, MCPs, PIPs DIPs with good range of motion with no step tenderness or synovitis. Hip joints are good range of motion she had discomfort and pain with range of motion of bilateral knee joints without any warmth swelling or effusion. She has tenderness across her MTP joints but no synovitis was noted.  CDAI Exam: No  CDAI exam completed.    Investigation: Findings:  04/29/2016 CBC normal, CMP GFR 83, 05/15/2015 ESR 20 X-ray thoracic spine 08/18/2016 showed multilevel degenerative disc disease with end plate spurring. No compression fracture by Dr. [place]    Imaging: Xr Cervical Spine 2 Or 3 Views  Result Date: 01/02/2017 Multilevel mild spondylosis with C5-6 and C7 narrowing and anterior spurring Impression: These findings are consistent with mild spondylosis  Xr Foot 2 Views Left  Result Date: 01/02/2017 First MTP narrowing all PIP/DIP narrowing no erosive changes small calcaneal spur Impression: These findings are consistent with mild osteoarthritis.  Xr Foot 2 Views Right  Result Date: 01/02/2017 First MTP narrowing all PIP/DIP narrowing no erosive changes small calcaneal spur Impression: These findings are consistent with mild osteoarthritis.  Xr Hand 2 View Left  Result Date: 01/02/2017 CMC, PIP, DIP narrowing no MCP  joint narrowing or erosive changes no intercarpal joint space narrowing or erosive changes. Impression these findings are consistent with mild osteoarthritis  Xr Hand 2 View Right  Result Date: 01/02/2017 CMC, PIP, DIP narrowing no MCP joint narrowing or erosive changes no intercarpal joint space narrowing or erosive changes. Impression these findings are consistent with mild osteoarthritis  Xr Knee 3 View Left  Result Date: 01/02/2017 Moderate medial compartment narrowing moderate lateral compartment narrowing, no chondrocalcinosis, severe patellofemoral narrowing. Impression: These findings are consistent with moderate osteoarthritis and chondromalacia patella  Xr Knee 3 View Right  Result Date: 01/02/2017 Moderate medial compartment narrowing moderate lateral compartment narrowing, no chondrocalcinosis, severe patellofemoral narrowing. Impression: These findings are consistent with moderate osteoarthritis and chondromalacia patella  Xr Shoulder Right  Result Date: 01/02/2017 No glenohumeral joint space narrowing, no before meals joint space narrowing. Impression: Shoulder joint x-ray was within normal limits.   Speciality Comments: No specialty comments available.    Procedures:  No procedures performed Allergies: Codeine   Assessment / Plan:     Visit Diagnoses: Pain, neck - Plan: XR Cervical Spine 2 or 3 views, XR Cervical Spine 2 or 3 views  DDD thoracic spine  Pain in both hands - Plan: XR Hand 2 View Right, XR Hand 2 View Left, XR Hand 2 View Right, XR Hand 2 View Left  Chronic pain of both knees - Plan: XR KNEE 3 VIEW RIGHT, XR KNEE 3 VIEW LEFT, XR Foot 2 Views Right, XR Foot 2 Views Left, XR KNEE 3 VIEW RIGHT, XR KNEE 3 VIEW LEFT  Pain in both feet - Plan: XR Foot 2 Views Right, XR Foot 2 Views Left  Chronic right shoulder pain - Plan: XR Shoulder Right, XR Shoulder Right  Hx of Hashimoto thyroiditis  Hyperlipidemia  Morbid obesity [facility])  Former smoker - Quit  July 2017  Irritable bowel syndrome  Sleep apnea  History of breast cancer    Orders: Orders Placed This Encounter  Procedures  . XR Shoulder Right  . XR KNEE 3 VIEW RIGHT  . XR KNEE 3 VIEW LEFT  . XR Hand 2 View Right  . XR Hand 2 View Left  . XR Cervical Spine 2 or 3 views  . XR Foot 2 Views Right  . XR Foot 2 Views Left  . XR Shoulder Right  . XR KNEE 3 VIEW RIGHT  . XR KNEE 3 VIEW LEFT  . XR Hand 2 View Right  . XR Hand 2 View Left  . XR Cervical Spine 2 or 3 views  . XR Foot 2 Views Right  . XR Foot 2 Views Left  No orders of the defined types were placed in this encounter.   Face-to-face time spent with patient was 50 minutes. 50% of time was spent in counseling and coordination of care.  Follow-Up Instructions: Return in about 6 months (around 07/02/2017) for Osteoarthritis.   Bo Merino, MD  Note - This record has been created using Editor, commissioning.  Chart creation errors have been sought, but may not always  have been located. Such creation errors do not reflect on  the standard of medical care.

## 2017-01-02 ENCOUNTER — Encounter: Payer: Self-pay | Admitting: Rheumatology

## 2017-01-02 ENCOUNTER — Ambulatory Visit (INDEPENDENT_AMBULATORY_CARE_PROVIDER_SITE_OTHER): Payer: Medicare HMO

## 2017-01-02 ENCOUNTER — Ambulatory Visit (INDEPENDENT_AMBULATORY_CARE_PROVIDER_SITE_OTHER): Payer: Medicare HMO | Admitting: Rheumatology

## 2017-01-02 VITALS — BP 144/89 | HR 91 | Resp 14 | Ht 66.0 in | Wt 275.0 lb

## 2017-01-02 DIAGNOSIS — M79672 Pain in left foot: Secondary | ICD-10-CM | POA: Diagnosis not present

## 2017-01-02 DIAGNOSIS — C50411 Malignant neoplasm of upper-outer quadrant of right female breast: Secondary | ICD-10-CM

## 2017-01-02 DIAGNOSIS — F17200 Nicotine dependence, unspecified, uncomplicated: Secondary | ICD-10-CM

## 2017-01-02 DIAGNOSIS — K589 Irritable bowel syndrome without diarrhea: Secondary | ICD-10-CM

## 2017-01-02 DIAGNOSIS — G8929 Other chronic pain: Secondary | ICD-10-CM

## 2017-01-02 DIAGNOSIS — M25561 Pain in right knee: Secondary | ICD-10-CM | POA: Diagnosis not present

## 2017-01-02 DIAGNOSIS — E785 Hyperlipidemia, unspecified: Secondary | ICD-10-CM | POA: Diagnosis not present

## 2017-01-02 DIAGNOSIS — M25562 Pain in left knee: Secondary | ICD-10-CM

## 2017-01-02 DIAGNOSIS — M79671 Pain in right foot: Secondary | ICD-10-CM

## 2017-01-02 DIAGNOSIS — M542 Cervicalgia: Secondary | ICD-10-CM

## 2017-01-02 DIAGNOSIS — G4730 Sleep apnea, unspecified: Secondary | ICD-10-CM | POA: Diagnosis not present

## 2017-01-02 DIAGNOSIS — M25511 Pain in right shoulder: Secondary | ICD-10-CM | POA: Diagnosis not present

## 2017-01-02 DIAGNOSIS — Z8639 Personal history of other endocrine, nutritional and metabolic disease: Secondary | ICD-10-CM

## 2017-01-02 DIAGNOSIS — M79641 Pain in right hand: Secondary | ICD-10-CM | POA: Diagnosis not present

## 2017-01-02 DIAGNOSIS — M79642 Pain in left hand: Secondary | ICD-10-CM

## 2017-01-02 DIAGNOSIS — M47814 Spondylosis without myelopathy or radiculopathy, thoracic region: Secondary | ICD-10-CM | POA: Diagnosis not present

## 2017-01-02 NOTE — Patient Instructions (Addendum)
Supplements for OA Natural anti-inflammatories  You can purchase these at [employer], [employer] or online.  . Turmeric (capsules)  . Ginger (ginger root or capsules)  . Omega 3 (Fish, flax seeds, chia seeds, walnuts, almonds)  . Tart cherry (dried or extract)   Patient should be under the care of a physician while taking these supplements. This may not be reproduced without the permission of Dr. Bo Merino.  Knee Exercises Ask your health care provider which exercises are safe for you. Do exercises exactly as told by your health care provider and adjust them as directed. It is normal to feel mild stretching, pulling, tightness, or discomfort as you do these exercises, but you should stop right away if you feel sudden pain or your pain gets worse.Do not begin these exercises until told by your health care provider. STRETCHING AND RANGE OF MOTION EXERCISES  These exercises warm up your muscles and joints and improve the movement and flexibility of your knee. These exercises also help to relieve pain, numbness, and tingling. Exercise A: Knee Extension, Prone 1. Lie on your abdomen on a bed. 2. Place your left / right knee just beyond the edge of the surface so your knee is not on the bed. You can put a towel under your left / right thigh just above your knee for comfort. 3. Relax your leg muscles and allow gravity to straighten your knee. You should feel a stretch behind your left / right knee. 4. Hold this position for __________ seconds. 5. Scoot up so your knee is supported between repetitions. Repeat __________ times. Complete this stretch __________ times a day. Exercise B: Knee Flexion, Active  1. Lie on your back with both knees straight. If this causes back discomfort, bend your left / right knee so your foot is flat on the floor. 2. Slowly slide your left / right heel back toward your buttocks until you feel a gentle stretch in the front of your knee or thigh. 3. Hold  this position for __________ seconds. 4. Slowly slide your left / right heel back to the starting position. Repeat __________ times. Complete this exercise __________ times a day. Exercise C: Quadriceps, Prone  1. Lie on your abdomen on a firm surface, such as a bed or padded floor. 2. Bend your left / right knee and hold your ankle. If you cannot reach your ankle or pant leg, loop a belt around your foot and grab the belt instead. 3. Gently pull your heel toward your buttocks. Your knee should not slide out to the side. You should feel a stretch in the front of your thigh and knee. 4. Hold this position for __________ seconds. Repeat __________ times. Complete this stretch __________ times a day. Exercise D: Hamstring, Supine 1. Lie on your back. 2. Loop a belt or towel over the ball of your left / right foot. The ball of your foot is on the walking surface, right under your toes. 3. Straighten your left / right knee and slowly pull on the belt to raise your leg until you feel a gentle stretch behind your knee.  Do not let your left / right knee bend while you do this.  Keep your other leg flat on the floor. 4. Hold this position for __________ seconds. Repeat __________ times. Complete this stretch __________ times a day. STRENGTHENING EXERCISES  These exercises build strength and endurance in your knee. Endurance is the ability to use your muscles for a long time, even after  they get tired. Exercise E: Quadriceps, Isometric  1. Lie on your back with your left / right leg extended and your other knee bent. Put a rolled towel or small pillow under your knee if told by your health care provider. 2. Slowly tense the muscles in the front of your left / right thigh. You should see your kneecap slide up toward your hip or see increased dimpling just above the knee. This motion will push the back of the knee toward the floor. 3. For __________ seconds, keep the muscle as tight as you can without  increasing your pain. 4. Relax the muscles slowly and completely. Repeat __________ times. Complete this exercise __________ times a day. Exercise F: Straight Leg Raises - Quadriceps 1. Lie on your back with your left / right leg extended and your other knee bent. 2. Tense the muscles in the front of your left / right thigh. You should see your kneecap slide up or see increased dimpling just above the knee. Your thigh may even shake a bit. 3. Keep these muscles tight as you raise your leg 4-6 inches (10-15 cm) off the floor. Do not let your knee bend. 4. Hold this position for __________ seconds. 5. Keep these muscles tense as you lower your leg. 6. Relax your muscles slowly and completely after each repetition. Repeat __________ times. Complete this exercise __________ times a day. Exercise G: Hamstring, Isometric 1. Lie on your back on a firm surface. 2. Bend your left / right knee approximately __________ degrees. 3. Dig your left / right heel into the surface as if you are trying to pull it toward your buttocks. Tighten the muscles in the back of your thighs to dig as hard as you can without increasing any pain. 4. Hold this position for __________ seconds. 5. Release the tension gradually and allow your muscles to relax completely for __________ seconds after each repetition. Repeat __________ times. Complete this exercise __________ times a day. Exercise H: Hamstring Curls  If told by your health care provider, do this exercise while wearing ankle weights. Begin with __________ weights. Then increase the weight by 1 lb (0.5 kg) increments. Do not wear ankle weights that are more than __________. 1. Lie on your abdomen with your legs straight. 2. Bend your left / right knee as far as you can without feeling pain. Keep your hips flat against the floor. 3. Hold this position for __________ seconds. 4. Slowly lower your leg to the starting position. Repeat __________ times. Complete this  exercise __________ times a day. Exercise I: Squats (Quadriceps) 1. Stand in front of a table, with your feet and knees pointing straight ahead. You may rest your hands on the table for balance but not for support. 2. Slowly bend your knees and lower your hips like you are going to sit in a chair.  Keep your weight over your heels, not over your toes.  Keep your lower legs upright so they are parallel with the table legs.  Do not let your hips go lower than your knees.  Do not bend lower than told by your health care provider.  If your knee pain increases, do not bend as low. 3. Hold the squat position for __________ seconds. 4. Slowly push with your legs to return to standing. Do not use your hands to pull yourself to standing. Repeat __________ times. Complete this exercise __________ times a day. Exercise J: Wall Slides (Quadriceps)  1. Lean your back against a smooth  wall or door while you walk your feet out 18-24 inches (46-61 cm) from it. 2. Place your feet hip-width apart. 3. Slowly slide down the wall or door until your knees bend __________ degrees. Keep your knees over your heels, not over your toes. Keep your knees in line with your hips. 4. Hold for __________ seconds. Repeat __________ times. Complete this exercise __________ times a day. Exercise K: Straight Leg Raises - Hip Abductors 1. Lie on your side with your left / right leg in the top position. Lie so your head, shoulder, knee, and hip line up. You may bend your bottom knee to help you keep your balance. 2. Roll your hips slightly forward so your hips are stacked directly over each other and your left / right knee is facing forward. 3. Leading with your heel, lift your top leg 4-6 inches (10-15 cm). You should feel the muscles in your outer hip lifting.  Do not let your foot drift forward.  Do not let your knee roll toward the ceiling. 4. Hold this position for __________ seconds. 5. Slowly return your leg to the  starting position. 6. Let your muscles relax completely after each repetition. Repeat __________ times. Complete this exercise __________ times a day. Exercise L: Straight Leg Raises - Hip Extensors 1. Lie on your abdomen on a firm surface. You can put a pillow under your hips if that is more comfortable. 2. Tense the muscles in your buttocks and lift your left / right leg about 4-6 inches (10-15 cm). Keep your knee straight as you lift your leg. 3. Hold this position for __________ seconds. 4. Slowly lower your leg to the starting position. 5. Let your leg relax completely after each repetition. Repeat __________ times. Complete this exercise __________ times a day. This information is not intended to replace advice given to you by your health care provider. Make sure you discuss any questions you have with your health care provider. Document Released: 10/14/2005 Document Revised: 08/24/2016 Document Reviewed: 10/06/2015 Else[employer] Interactive Patient Education  2017 [employer] [employer].  Back Exercises The following exercises strengthen the muscles that help to support the back. They also help to keep the lower back flexible. Doing these exercises can help to prevent back pain or lessen existing pain. If you have back pain or discomfort, try doing these exercises 2-3 times each day or as told by your health care provider. When the pain goes away, do them once each day, but increase the number of times that you repeat the steps for each exercise (do more repetitions). If you do not have back pain or discomfort, do these exercises once each day or as told by your health care provider. Exercises Single Knee to Chest  Repeat these steps 3-5 times for each leg: 6. Lie on your back on a firm bed or the floor with your legs extended. 7. Bring one knee to your chest. Your other leg should stay extended and in contact with the floor. 8. Hold your knee in place by grabbing your knee or thigh. 9. Pull on your  knee until you feel a gentle stretch in your lower back. 10. Hold the stretch for 10-30 seconds. 11. Slowly release and straighten your leg. Pelvic Tilt  Repeat these steps 5-10 times: 5. Lie on your back on a firm bed or the floor with your legs extended. 6. Bend your knees so they are pointing toward the ceiling and your feet are flat on the floor. 7. Tighten your  lower abdominal muscles to press your lower back against the floor. This motion will tilt your pelvis so your tailbone points up toward the ceiling instead of pointing to your feet or the floor. 8. With gentle tension and even breathing, hold this position for 5-10 seconds. Cat-Cow  Repeat these steps until your lower back becomes more flexible: 5. Get into a hands-and-knees position on a firm surface. Keep your hands under your shoulders, and keep your knees under your hips. You may place padding under your knees for comfort. 6. Let your head hang down, and point your tailbone toward the floor so your lower back becomes rounded like the back of a cat. 7. Hold this position for 5 seconds. 8. Slowly lift your head and point your tailbone up toward the ceiling so your back forms a sagging arch like the back of a cow. 9. Hold this position for 5 seconds. Press-Ups  Repeat these steps 5-10 times: 1. Lie on your abdomen (face-down) on the floor. 2. Place your palms near your head, about shoulder-width apart. 3. While you keep your back as relaxed as possible and keep your hips on the floor, slowly straighten your arms to raise the top half of your body and lift your shoulders. Do not use your back muscles to raise your upper torso. You may adjust the placement of your hands to make yourself more comfortable. 4. Hold this position for 5 seconds while you keep your back relaxed. 5. Slowly return to lying flat on the floor. Bridges  Repeat these steps 10 times: 5. Lie on your back on a firm surface. 6. Bend your knees so they are  pointing toward the ceiling and your feet are flat on the floor. 7. Tighten your buttocks muscles and lift your buttocks off of the floor until your waist is at almost the same height as your knees. You should feel the muscles working in your buttocks and the back of your thighs. If you do not feel these muscles, slide your feet 1-2 inches farther away from your buttocks. 8. Hold this position for 3-5 seconds. 9. Slowly lower your hips to the starting position, and allow your buttocks muscles to relax completely. If this exercise is too easy, try doing it with your arms crossed over your chest. Abdominal Crunches  Repeat these steps 5-10 times: 7. Lie on your back on a firm bed or the floor with your legs extended. 8. Bend your knees so they are pointing toward the ceiling and your feet are flat on the floor. 9. Cross your arms over your chest. 10. Tip your chin slightly toward your chest without bending your neck. 94. Tighten your abdominal muscles and slowly raise your trunk (torso) high enough to lift your shoulder blades a tiny bit off of the floor. Avoid raising your torso higher than that, because it can put too much stress on your low back and it does not help to strengthen your abdominal muscles. 12. Slowly return to your starting position. Back Lifts  Repeat these steps 5-10 times: 6. Lie on your abdomen (face-down) with your arms at your sides, and rest your forehead on the floor. 7. Tighten the muscles in your legs and your buttocks. 8. Slowly lift your chest off of the floor while you keep your hips pressed to the floor. Keep the back of your head in line with the curve in your back. Your eyes should be looking at the floor. 9. Hold this position for 3-5 seconds.  10. Slowly return to your starting position. Contact a health care provider if:  Your back pain or discomfort gets much worse when you do an exercise.  Your back pain or discomfort does not lessen within 2 hours after you  exercise. If you have any of these problems, stop doing these exercises right away. Do not do them again unless your health care provider says that you can. Get help right away if:  You develop sudden, severe back pain. If this happens, stop doing the exercises right away. Do not do them again unless your health care provider says that you can. This information is not intended to replace advice given to you by your health care provider. Make sure you discuss any questions you have with your health care provider. Document Released: 01/07/2005 Document Revised: 04/08/2016 Document Reviewed: 01/24/2015 [employer] Interactive Patient Education  2017 [employer]  Cervical Strain and Sprain Rehab Ask your health care provider which exercises are safe for you. Do exercises exactly as told by your health care provider and adjust them as directed. It is normal to feel mild stretching, pulling, tightness, or discomfort as you do these exercises, but you should stop right away if you feel sudden pain or your pain gets worse.Do not begin these exercises until told by your health care provider. Stretching and range of motion exercises These exercises warm up your muscles and joints and improve the movement and flexibility of your neck. These exercises also help to relieve pain, numbness, and tingling. Exercise A: Cervical side bend 12. Using good posture, sit on a stable chair or stand up. 13. Without moving your shoulders, slowly tilt your left / right ear to your shoulder until you feel a stretch in your neck muscles. You should be looking straight ahead. 14. Hold for __________ seconds. 15. Repeat with the other side of your neck. Repeat __________ times. Complete this exercise __________ times a day. Exercise B: Cervical rotation 1. Using good posture, sit on a stable chair or stand up. 2. Slowly turn your head to the side as if you are looking over your left / right shoulder.  Keep your eyes level  with the ground.  Stop when you feel a stretch along the side and the back of your neck. 3. Hold for __________ seconds. 4. Repeat this by turning to your other side. Repeat __________ times. Complete this exercise __________ times a day. Exercise C: Thoracic extension and pectoral stretch 10. Roll a towel or a small blanket so it is about 4 inches (10 cm) in diameter. 11. Lie down on your back on a firm surface. 12. Put the towel lengthwise, under your spine in the middle of your back. It should not be not under your shoulder blades. The towel should line up with your spine from your middle back to your lower back. 13. Put your hands behind your head and let your elbows fall out to your sides. 14. Hold for __________ seconds. Repeat __________ times. Complete this exercise __________ times a day. Strengthening exercises These exercises build strength and endurance in your neck. Endurance is the ability to use your muscles for a long time, even after your muscles get tired. Exercise D: Upper cervical flexion, isometric 6. Lie on your back with a thin pillow behind your head and a small rolled-up towel under your neck. 7. Gently tuck your chin toward your chest and nod your head down to look toward your feet. Do not lift your head off the pillow.  8. Hold for __________ seconds. 9. Release the tension slowly. Relax your neck muscles completely before you repeat this exercise. Repeat __________ times. Complete this exercise __________ times a day. Exercise E: Cervical extension, isometric 10. Stand about 6 inches (15 cm) away from a wall, with your back facing the wall. 11. Place a soft object, about 6-8 inches (15-20 cm) in diameter, between the back of your head and the wall. A soft object could be a small pillow, a ball, or a folded towel. 12. Gently tilt your head back and press into the soft object. Keep your jaw and forehead relaxed. 13. Hold for __________ seconds. 14. Release the  tension slowly. Relax your neck muscles completely before you repeat this exercise. Repeat __________ times. Complete this exercise __________ times a day. Posture and body mechanics   Body mechanics refers to the movements and positions of your body while you do your daily activities. Posture is part of body mechanics. Good posture and healthy body mechanics can help to relieve stress in your body's tissues and joints. Good posture means that your spine is in its natural S-curve position (your spine is neutral), your shoulders are pulled back slightly, and your head is not tipped forward. The following are general guidelines for applying improved posture and body mechanics to your everyday activities. Standing  When standing, keep your spine neutral and keep your feet about hip-width apart. Keep a slight bend in your knees. Your ears, shoulders, and hips should line up.  When you do a task in which you stand in one place for a long time, place one foot up on a stable object that is 2-4 inches (5-10 cm) high, such as a footstool. This helps keep your spine neutral. Sitting  When sitting, keep your spine neutral and your keep feet flat on the floor. Use a footrest, if necessary, and keep your thighs parallel to the floor. Avoid rounding your shoulders, and avoid tilting your head forward.  When working at a desk or a computer, keep your desk at a height where your hands are slightly lower than your elbows. Slide your chair under your desk so you are close enough to maintain good posture.  When working at a computer, place your monitor at a height where you are looking straight ahead and you do not have to tilt your head forward or downward to look at the screen. Resting When lying down and resting, avoid positions that are most painful for you. Try to support your neck in a neutral position. You can use a contour pillow or a small rolled-up towel. Your pillow should support your neck but not push on  it. This information is not intended to replace advice given to you by your health care provider. Make sure you discuss any questions you have with your health care provider. Document Released: 11/30/2005 Document Revised: 08/06/2016 Document Reviewed: 11/06/2015 [employer] Interactive Patient Education  2017 [employer].

## 2017-01-04 ENCOUNTER — Encounter: Payer: Self-pay | Admitting: Internal Medicine

## 2017-01-04 ENCOUNTER — Other Ambulatory Visit: Payer: Self-pay | Admitting: Internal Medicine

## 2017-01-04 DIAGNOSIS — E890 Postprocedural hypothyroidism: Secondary | ICD-10-CM

## 2017-01-04 LAB — T4, FREE: FREE T4: 0.31 ng/dL — AB (ref 0.60–1.60)

## 2017-01-04 LAB — TSH

## 2017-01-04 LAB — T3, FREE: T3, Free: 1.5 pg/mL — ABNORMAL LOW (ref 2.3–4.2)

## 2017-01-04 MED ORDER — LEVOTHYROXINE SODIUM 25 MCG PO TABS: 25.0000 ug | ORAL_TABLET | Freq: Every day | ORAL | 1 refills | Status: DC

## 2017-01-12 ENCOUNTER — Ambulatory Visit (INDEPENDENT_AMBULATORY_CARE_PROVIDER_SITE_OTHER)
Admission: RE | Admit: 2017-01-12 | Discharge: 2017-01-12 | Disposition: A | Discharge status: Home / Self Care | Payer: Medicare HMO | Source: Ambulatory Visit | Attending: Physician Assistant | Admitting: Physician Assistant

## 2017-01-12 ENCOUNTER — Ambulatory Visit (INDEPENDENT_AMBULATORY_CARE_PROVIDER_SITE_OTHER): Payer: Medicare HMO | Admitting: Physician Assistant

## 2017-01-12 ENCOUNTER — Encounter: Payer: Self-pay | Admitting: Physician Assistant

## 2017-01-12 VITALS — BP 140/82 | HR 84 | Ht 66.0 in | Wt 275.0 lb

## 2017-01-12 DIAGNOSIS — M546 Pain in thoracic spine: Secondary | ICD-10-CM

## 2017-01-12 DIAGNOSIS — G8929 Other chronic pain: Secondary | ICD-10-CM | POA: Diagnosis not present

## 2017-01-12 DIAGNOSIS — K5900 Constipation, unspecified: Secondary | ICD-10-CM | POA: Diagnosis not present

## 2017-01-12 MED ORDER — LINACLOTIDE 290 MCG PO CAPS: 290.0000 ug | ORAL_CAPSULE | Freq: Every day | ORAL | 1 refills | Status: DC

## 2017-01-12 NOTE — Progress Notes (Signed)
Subjective:    Patient ID: Sophia Kelley, female    DOB: 19-Nov-1951, 66 y.o.   MRN: [id number]  HPI Corrissa is a 66 year old white female, now established with Dr. Havery Moros who comes in today with complaints of worsening constipation and right   back pain. Her last office visit was in September 2017 with complaints of chronic constipation. At that time she was advised to continue taking Linzess 290 g every 2-3 days. Last colonoscopy was done in June 2015 with finding of multiple hyperplastic colon polyps. Plan is to do a 5 year interval follow-up due to history of breast cancer. Patient comes in distress today because over the past couple of months the Lynn's S has not been working as well. She has been very reluctant to take the Rowan on a daily basis because she is afraid she will become "immune to it". She says she got in a bad situation a couple of weeks ago with severe constipation and did not have a bowel movement for a week. She says she gets very and comfortable in this occurs. She wound up taking a combination of mineral oil daily Linzess, magnesium site trait and milk of magnesia. She says she finally had good bowel movements. She is back to taking the Linzess every other day. Over the past couple weeks she's also had pain in her mid right back. She says this seems to be positional worse with lying down and certain movements twisting etc. She does not recall any injury fall etc. but said she did a lot of straining when she was severely constipated. Says she feels best if she sits still in one  position without moving.  Review of Systems Pertinent positive and negative review of systems were noted in the above HPI section.  All other review of systems was otherwise negative.  Outpatient Encounter Prescriptions as of 01/12/2017  Medication Sig  . aspirin 81 MG tablet Take 81 mg by mouth daily.  Marland Kitchen ibuprofen (ADVIL,MOTRIN) 100 MG tablet Take 200 mg by mouth every 8 (eight) hours as needed  for fever.  . levothyroxine (SYNTHROID, LEVOTHROID) 25 MCG tablet Take 1 tablet (25 mcg total) by mouth daily before breakfast. For 1 week, then increase to 50 mcg daily.  Marland Kitchen linaclotide (LINZESS) 290 MCG CAPS capsule Take 1 capsule (290 mcg total) by mouth daily. (Patient taking differently: Take 290 mcg by mouth every other day. )  . metoprolol (LOPRESSOR) 50 MG tablet 1.5 tablets (75mg ) two times a day (Patient taking differently: 1.5 tablets (75mg ) two times a day)  . Multiple Vitamin (MULTIVITAMIN) tablet Take 1 tablet by mouth daily.  Marland Kitchen nystatin-triamcinolone (MYCOLOG II) cream Apply 1 application topically 2 (two) times daily.  Marland Kitchen linaclotide (LINZESS) 290 MCG CAPS capsule Take 1 capsule (290 mcg total) by mouth daily before breakfast.  . [DISCONTINUED] HYDROcodone-acetaminophen (NORCO) 5-325 MG tablet Take 1-2 tablets by mouth every 6 (six) hours as needed. (Patient not taking: Reported on 01/02/2017)  . [DISCONTINUED] naproxen (NAPROSYN) 500 MG tablet Take 500 mg by mouth 2 (two) times daily. for 10 days  . [DISCONTINUED] traMADol (ULTRAM) 50 MG tablet Take 1-2 tablets (50-100 mg total) by mouth every 6 (six) hours as needed. (Patient not taking: Reported on 10/21/2016)   No facility-administered encounter medications on file as of 01/12/2017.    Allergies  Allergen Reactions  . Codeine Rash   Patient Active Problem List   Diagnosis Date Noted  . Postablative hypothyroidism 01/04/2017  . Breast cancer of  upper-outer quadrant of right female breast ([place]) 04/24/2016  . Graves disease 04/02/2016  . Sinus tachycardia 03/15/2016  . Hx of Hashimoto thyroiditis 01/03/2016  . Angina pectoris ([place]) 10/19/2015  . Occipital neuralgia of right side 05/15/2015  . Chronic constipation 02/01/2014  . Acute appendicitis 01/31/2014  . Other screening mammogram 07/23/2011  . Vitamin B12 deficiency 07/23/2011  . Chest pain, atypical 07/23/2011  . HYPOTHYROIDISM 02/20/2011  . Sleep apnea 02/20/2011    . ALLERGIC RHINITIS 02/03/2011  . LOW BACK PAIN, CHRONIC 01/02/2011  . Morbid obesity ([place]) 11/20/2010  . TOBACCO USE 11/20/2010  . PVD 11/20/2010  . Hyperlipidemia 10/29/2009  . CONSTIPATION, CHRONIC 10/29/2009  . IRRITABLE BOWEL SYNDROME 10/29/2009   Social History   Social History  . Marital status: Married    Spouse name: N/A  . Number of children: 2  . Years of education: 14   Occupational History  . unemployed   .  Unemployed   Social History Main Topics  . Smoking status: Former Smoker    Years: 40.00    Types: Cigarettes    Quit date: 05/14/2016  . Smokeless tobacco: Never Used     Comment: 5-6 cig./day  . Alcohol use No  . Drug use: No  . Sexual activity: Yes    Birth control/ protection: Surgical   Other Topics Concern  . Not on file   Social History Narrative   Caffeine Use - 1 cup coffee/day   Patient is right handed.          Ms. Heggs's family history includes Allergies in her father; Arthritis in her father and mother; Heart attack (age of onset: 7) in her father; Heart disease in her father; Hypertension in her mother; Osteoporosis in her mother; Prostate cancer in her brother; Thyroid disease in her mother.      Objective:    Vitals:   01/12/17 1321  BP: 140/82  Pulse: 84    Physical Exam  well-developed white female in no acute distress, pleasant blood pressure 140/82 pulse 84, height 5 foot 6, weight 275, BMI 44.3. HEENT; nontraumatic normocephalic EOMI PERRLA sclera anicteric, Cardiovascular; regular rate and rhythm with S1-S2 no murmur or gallop, Pulmonary ;clear bilaterally, Abdomen ;obese soft nontender nondistended bowel sounds are active there is no palpable mass or hepatosplenomegaly Exam of the back; with mild tenderness to palpation over the lower right posterior ribs. Rectal; not done, Extremities no clubbing cyanosis or edema skin warm and dry, Neuropsych; mood and affect appropriate       Assessment & Plan:   #28  66 year old white female with chronic constipation with recent acute  episode of obstipation. #2 right back pain 2 weeks, musculoskeletal consistent with strain  #3 history of breast cancer diagnosed 2017-gauge 1 and a has not completed radiation #4 history of Graves' disease status post radiation #5 IBS #6 obesity #7 status post hysterectomy and appendectomy #8 history of hyperplastic colon polyps-plan is for follow-up colonoscopy 2020  Plan; KUB today-to assess degree of obstipation. If patient still very constipated will give her a bowel purge Encouraged to take Linzess 290 g once every day Obtain rib details right posterior  Plan;   Amy Genia Harold PA-C 01/12/2017   Cc: Delilah Shan, MD

## 2017-01-12 NOTE — Patient Instructions (Signed)
Please go to the basement level to our Radiology department.  Hallway past vending machine. Right side.   We sent a prescription for Linzess 290 mcg, take 1 tab by mouth daily.  Push fluids.  Try moist heat to your back. Bengay ointment to Right back.

## 2017-01-13 ENCOUNTER — Telehealth: Payer: Self-pay | Admitting: Physician Assistant

## 2017-01-13 NOTE — Telephone Encounter (Signed)
See result note.  

## 2017-01-13 NOTE — Progress Notes (Signed)
Agree with assessment and plan as outlined.  

## 2017-01-20 ENCOUNTER — Telehealth: Payer: Self-pay | Admitting: Physician Assistant

## 2017-01-20 NOTE — Telephone Encounter (Signed)
Normal xrays as discussed on 01/14/17. Called back. Left message on the v/m to call again.

## 2017-02-04 ENCOUNTER — Ambulatory Visit: Payer: Self-pay | Admitting: Rheumatology

## 2017-02-24 ENCOUNTER — Ambulatory Visit: Payer: Medicare HMO | Admitting: Gastroenterology

## 2017-03-22 ENCOUNTER — Other Ambulatory Visit: Payer: Self-pay | Admitting: Obstetrics & Gynecology

## 2017-03-22 DIAGNOSIS — Z1231 Encounter for screening mammogram for malignant neoplasm of breast: Secondary | ICD-10-CM

## 2017-03-31 ENCOUNTER — Other Ambulatory Visit: Payer: Self-pay | Admitting: Obstetrics & Gynecology

## 2017-03-31 DIAGNOSIS — Z853 Personal history of malignant neoplasm of breast: Secondary | ICD-10-CM

## 2017-04-01 ENCOUNTER — Ambulatory Visit: Payer: Medicare HMO | Admitting: Internal Medicine

## 2017-04-02 ENCOUNTER — Other Ambulatory Visit: Payer: Self-pay | Admitting: General Surgery

## 2017-04-02 ENCOUNTER — Ambulatory Visit
Admission: RE | Admit: 2017-04-02 | Discharge: 2017-04-02 | Disposition: A | Discharge status: Home / Self Care | Payer: Medicare HMO | Source: Ambulatory Visit | Attending: Obstetrics & Gynecology | Admitting: Obstetrics & Gynecology

## 2017-04-02 DIAGNOSIS — Z853 Personal history of malignant neoplasm of breast: Secondary | ICD-10-CM

## 2017-04-02 DIAGNOSIS — R109 Unspecified abdominal pain: Secondary | ICD-10-CM

## 2017-04-12 ENCOUNTER — Other Ambulatory Visit: Payer: Self-pay | Admitting: Internal Medicine

## 2017-04-13 ENCOUNTER — Other Ambulatory Visit: Payer: Medicare HMO

## 2017-05-11 ENCOUNTER — Ambulatory Visit (INDEPENDENT_AMBULATORY_CARE_PROVIDER_SITE_OTHER): Payer: Medicare HMO | Admitting: Internal Medicine

## 2017-05-11 ENCOUNTER — Encounter: Payer: Self-pay | Admitting: Internal Medicine

## 2017-05-11 ENCOUNTER — Other Ambulatory Visit (INDEPENDENT_AMBULATORY_CARE_PROVIDER_SITE_OTHER): Payer: Medicare HMO

## 2017-05-11 VITALS — BP 128/82 | HR 92 | Wt 270.0 lb

## 2017-05-11 DIAGNOSIS — E890 Postprocedural hypothyroidism: Secondary | ICD-10-CM

## 2017-05-11 LAB — TSH: TSH: 4.81 u[IU]/mL — ABNORMAL HIGH (ref 0.35–4.50)

## 2017-05-11 LAB — T4, FREE: Free T4: 0.94 ng/dL (ref 0.60–1.60)

## 2017-05-11 MED ORDER — LEVOTHYROXINE SODIUM 75 MCG PO TABS: 75.0000 ug | ORAL_TABLET | Freq: Every day | ORAL | 1 refills | Status: DC

## 2017-05-11 NOTE — Patient Instructions (Addendum)
Please stop at [facility] lab.  Please take the thyroid hormone every day, with water, at least 30 minutes before breakfast, separated by at least 4 hours from:  - acid reflux medications  - calcium  - iron  - multivitamins   Please come back for labs after middle of July.  Please come back for a follow-up appointment in 3 months.

## 2017-05-11 NOTE — Progress Notes (Signed)
Sophia Kelley ID: Sophia Kelley, female   DOB: 1951-05-04, 66 y.o.   MRN: [id number]   HPI  Sophia Kelley is a 66 y.o.-year-old female, initially referred by her cardiologist, Dr Aundra Dubin, now returning for f/u for Graves ds. Last visit 4 mo ago.  Reviewed and addended hx: Pt. has been dx with hypothyroidism in 2013; her TPO antibodies were found to be elevated, confirming Hashimoto thyroiditis. She was on Levothyroxine 137 mcg, now off for ~2 years.  She went to the hospital with SOB >> r/o PE and had a catheterization >> no CAD, normal EF. She had the cath on 11/01/2015.  Patient had thyrotoxic thyroid tests in 09/2015, after which she was referred to see me. By the time I saw her a month later her free T4 and free T3 were normal and her TSH was much better, therefore, we decided to recheck the thyroid tests in 1-1/2 months to see if they improve. They did not >> we checked an Uptake and scan:  Uptake and scan (01/28/2016):  There is uniform uptake within an enlarged thyroid gland no nodularity. The pyramidal lobe is faintly identified. 24 hour I 131 uptake = 38.8% (normal 10-30%) IMPRESSION: Imaging and thyroid uptake are most suggestive of Graves disease.  RAI tx (10/15/2016)  She started to feel poorly after her RAI treatment (thyrotoxic symptoms: Heat intolerance, sweating, tremors, palpitations) so we had to start back methimazole at 5 mg twice a day, then subsequently decreased to 5 mg daily and stopped at last visit. She is on metoprolol 50 mg daily.  Since then, she developed post-ablative hypothyroidism and was started on levothyroxine. However, she did not return for thyroid tests after we started levothyroxine in 12/2016! She did not get my message through my chart.  Pt is on levothyroxine 50 mcg daily, taken: - in am - fasting - at least 30 min from b'fast - no Ca, Fe, MVI, PPIs - not on Biotin  I reviewed pt's thyroid tests: Lab Results  Component Value Date   TSH  57.47 Repeated and verified X2. (H) 01/01/2017   TSH 0.44 10/06/2016   TSH 0.06 (L) 07/06/2016   TSH 0.15 (L) 04/02/2016   TSH 0.13 (L) 03/09/2016   TSH 0.27 (L) 01/03/2016   TSH 0.16 (L) 11/06/2015   TSH 2.66 07/02/2011   TSH 4.24 02/20/2011   TSH 2.94 01/23/2011   FREET4 0.31 (L) 01/01/2017   FREET4 2.25 (H) 10/06/2016   FREET4 2.26 (H) 07/06/2016   FREET4 1.73 (H) 04/02/2016   FREET4 2.41 (H) 03/09/2016   FREET4 1.91 (H) 01/03/2016   FREET4 1.15 11/06/2015   FREET4 0.95 02/20/2011  11/06/2015: TSI antibodies 331 (<140%) 10/10/2015: TPO antibodies >1000 10/10/2015: TSH <0.006, total T3 3.3 (1-1.7), free T4 1.97 (0.82-1.77) - not on thyroid hormones at that time  02/20/2015: TSH 0.04, free T4 1.25  She has + FH of thyroid disorders in: mother, M aunts, all cousins - 2 relatives with Graves ds. No FH of thyroid cancer. No h/o radiation tx to head or neck.  No seaweed or kelp. No recent contrast studies. No herbal supplements. No Biotin use. No recent steroids use.   She has wrist gout >> was on Prednisone several years ago, no steroids recently.  ROS: Constitutional: + weight gain/no weight loss, no fatigue, no subjective hyperthermia, no subjective hypothermia, + burning with patient Eyes: + blurry vision, no xerophthalmia ENT: no sore throat, no nodules palpated in throat, no dysphagia, no odynophagia, no hoarseness Cardiovascular:  no CP/no SOB/no palpitations/+ leg swelling Respiratory: no cough/no SOB/no wheezing Gastrointestinal: no N/no V/no D/+ C/no acid reflux Musculoskeletal: no muscle aches/+ joint aches Skin: no rashes, + rash in right groin, + hair loss Neurological: + tremors/no numbness/no tingling/no dizziness  I reviewed pt's medications, allergies, PMH, social hx, family hx, and changes were documented in the history of present illness. Otherwise, unchanged from my initial visit note.  Ophthalmologist: Dr. Ellie Lunch.  Past Medical History:  Diagnosis Date   . Arthritis    right arm  . Breast cancer of upper-outer quadrant of right female breast ([place]) 04/24/2016  . Colon polyps   . Complication of anesthesia    states was hard to wake up after 1 c-section  . Constipation   . Graves' disease    Radiation treatment  . Immature cataract   . Lumbar herniated disc   . Nasal congestion 05/08/2016  . Obesity   . Shortness of breath dyspnea    with ADLs - no home O2  . Sinus problem   . Sleep apnea    had sleep study, did not go back for CPAP fitting  . Tachycardia    states due to Graves' disease   Past Surgical History:  Procedure Laterality Date  . ABDOMINAL HYSTERECTOMY  1990   complete  . BREAST LUMPECTOMY WITH RADIOACTIVE SEED AND SENTINEL LYMPH NODE BIOPSY Right 05/15/2016   Procedure: RIGHT BREAST LUMPECTOMY WITH RADIOACTIVE SEED AND SENTINEL LYMPH NODE BIOPSY;  Surgeon: Autumn Messing III, MD;  Location: [facility];  Service: General;  Laterality: Right;  . CARDIAC CATHETERIZATION N/A 11/01/2015   Procedure: Right/Left Heart Cath and Coronary Angiography;  Surgeon: Larey Dresser, MD;  Location: [facility];  Service: Cardiovascular;  Laterality: N/A;  . CESAREAN SECTION  [id number]   x 2  . COLONOSCOPY WITH PROPOFOL  06/12/2014  . LAPAROSCOPIC APPENDECTOMY N/A 01/31/2014   Procedure: APPENDECTOMY LAPAROSCOPIC;  Surgeon: Harl Bowie, MD;  Location: [facility];  Service: General;  Laterality: N/A;  . NASAL SEPTUM SURGERY  1984  . RECTOCELE REPAIR  09/01/2007   Social History   Social History  . Marital status: Married    Spouse name: N/A  . Number of children: 2  . Years of education: 14   Occupational History  . unemployed   .  Unemployed   Social History Main Topics  . Smoking status: Former Smoker    Years: 40.00    Types: Cigarettes    Quit date: 05/14/2016  . Smokeless tobacco: Never Used     Comment: 5-6 cig./day  . Alcohol use No  . Drug use: No  . Sexual activity: Yes    Birth control/  protection: Surgical   Other Topics Concern  . Not on file   Social History Narrative   Caffeine Use - 1 cup coffee/day   Patient is right handed.         Current Outpatient Prescriptions on File Prior to Visit  Medication Sig Dispense Refill  . aspirin 81 MG tablet Take 81 mg by mouth daily.    Marland Kitchen ibuprofen (ADVIL,MOTRIN) 100 MG tablet Take 200 mg by mouth every 8 (eight) hours as needed for fever.    . levothyroxine (SYNTHROID, LEVOTHROID) 25 MCG tablet TAKE 1 TABLET BY MOUTH ONCE DAILY WITH BREAKFAST FOR 1 WEEK THEN INCREASE TO 2 TABLETS EVERY DAY 90 tablet 0  . linaclotide (LINZESS) 290 MCG CAPS capsule Take 1 capsule (290 mcg total) by mouth daily. (  Patient taking differently: Take 290 mcg by mouth every other day. ) 90 capsule 3  . linaclotide (LINZESS) 290 MCG CAPS capsule Take 1 capsule (290 mcg total) by mouth daily before breakfast. 30 capsule 1  . metoprolol (LOPRESSOR) 50 MG tablet 1.5 tablets (75mg ) two times a day (Patient taking differently: 1.5 tablets (75mg ) two times a day) 270 tablet 3  . Multiple Vitamin (MULTIVITAMIN) tablet Take 1 tablet by mouth daily.    Marland Kitchen nystatin-triamcinolone (MYCOLOG II) cream Apply 1 application topically 2 (two) times daily.     No current facility-administered medications on file prior to visit.    Allergies  Allergen Reactions  . Codeine Rash   Family History  Problem Relation Age of Onset  . Heart attack Father 48       x3  . Allergies Father   . Arthritis Father   . Heart disease Father   . Prostate cancer Brother   . Thyroid disease Mother   . Hypertension Mother   . Arthritis Mother   . Osteoporosis Mother    PE: BP 128/82 (BP Location: Left Arm, Patient Position: Sitting)   Pulse 92   Wt 270 lb (122.5 kg)   LMP  (LMP Unknown)   SpO2 97%   BMI 43.58 kg/m  There is no height or weight on file to calculate BMI. Wt Readings from Last 3 Encounters:  05/11/17 270 lb (122.5 kg)  01/12/17 275 lb (124.7 kg)  01/02/17 275  lb (124.7 kg)   Constitutional:Obese, in NAD Eyes: PERRLA, EOMI, no exophthalmos ENT: moist mucous membranes, no thyromegaly, no cervical lymphadenopathy Cardiovascular: Tachycardia, RR, No MRG Respiratory: CTA B Gastrointestinal: abdomen soft, NT, ND, BS+ Musculoskeletal: no deformities, strength intact in all 4 Skin: moist, warm, + macular rash in the right groin extending on right thigh Neurological: + tremor with outstretched hands, DTR normal in all 4  ASSESSMENT: 1.Post-ablative hypothyroidism  - after RAI treatment for Graves' disease    2. H/o Hashimoto's thyroiditis  PLAN:  1. And 2. Patient with history of Hashimoto's hypothyroidism, previously on levothyroxine therapy for several years, however, she developed Graves' disease in 2016. She was initially on methimazole but she did not respond well to the medication, so she ended up having RAI treatment in 10/2016. She developed increased thyrotoxic symptoms after the treatment, so that we had to start methimazole. We eventually stopped methimazole at last visit, as she developed post-ablative hypothyroidism. We started levothyroxine initially 25 g daily and then increase to 50 g daily. Unfortunately, she did not return for TFTs in 1.5 months, as advised, after starting levothyroxine.  - latest thyroid labs reviewed with pt >> TSH is very high, but we started levothyroxine after this result. She did not return for another set of labs, unfortunately... - she continues on LT4 mcg 50 daily - we discussed about taking the thyroid hormone every day, with water, >30 minutes before breakfast, separated by >4 hours from acid reflux medications, calcium, iron, multivitamins. Pt. is taking it correctly - will check thyroid tests today: TSH and fT4 - If labs are abnormal, she will need to return for repeat TFTs in 1.5 months - OTW, RTC in 3 months  PT needs to be called with results!  Component     Latest Ref Rng & Units 05/11/2017   T4,Free(Direct)     0.60 - 1.60 ng/dL 0.94  TSH     0.35 - 4.50 uIU/mL 4.81 (H)   Will increase the dose of LT4  to 175 mcg daily and have her back for recheck in 1.5 mo.   Philemon Kingdom, MD PhD [facility]

## 2017-05-12 ENCOUNTER — Telehealth: Payer: Self-pay

## 2017-05-12 NOTE — Telephone Encounter (Signed)
LVM, gave lab results. Gave call back number if any questions or concerns.  

## 2017-05-12 NOTE — Telephone Encounter (Signed)
-----   Message from Philemon Kingdom, MD sent at 05/11/2017  5:07 PM EDT ----- Almyra Free, can you please call pt: TSH much better but still slightly high >> increase LT4 to 75 mcg daily.She needs to come back in 1.5 mo for labs >> she already scheduled this, I believe. Labs are in. I called in the new dose.

## 2017-06-07 ENCOUNTER — Ambulatory Visit
Admission: RE | Admit: 2017-06-07 | Discharge: 2017-06-07 | Disposition: A | Discharge status: Home / Self Care | Payer: Medicare HMO | Source: Ambulatory Visit | Attending: General Surgery | Admitting: General Surgery

## 2017-06-07 DIAGNOSIS — R109 Unspecified abdominal pain: Secondary | ICD-10-CM

## 2017-06-07 MED ORDER — IOPAMIDOL (ISOVUE-300) INJECTION 61%
100.0000 mL | Freq: Once | INTRAVENOUS | Status: AC | PRN
  Administered 2017-06-07: 125 mL via INTRAVENOUS

## 2017-06-22 ENCOUNTER — Telehealth: Payer: Self-pay

## 2017-06-22 ENCOUNTER — Other Ambulatory Visit (INDEPENDENT_AMBULATORY_CARE_PROVIDER_SITE_OTHER): Payer: Medicare HMO

## 2017-06-22 DIAGNOSIS — E890 Postprocedural hypothyroidism: Secondary | ICD-10-CM | POA: Diagnosis not present

## 2017-06-22 LAB — T4, FREE: Free T4: 0.96 ng/dL (ref 0.60–1.60)

## 2017-06-22 LAB — TSH: TSH: 2.57 u[IU]/mL (ref 0.35–4.50)

## 2017-06-22 NOTE — Telephone Encounter (Signed)
LVM, gave lab results. Gave call back number if any questions or concerns.  

## 2017-06-22 NOTE — Telephone Encounter (Signed)
-----   Message from Philemon Kingdom, MD sent at 06/22/2017  4:39 PM EDT ----- Almyra Free, can you please call pt: Finally, normal TFTs! Good news.

## 2017-07-06 ENCOUNTER — Ambulatory Visit: Payer: Medicare HMO | Admitting: Rheumatology

## 2017-08-17 ENCOUNTER — Encounter: Payer: Self-pay | Admitting: Internal Medicine

## 2017-08-17 ENCOUNTER — Ambulatory Visit (INDEPENDENT_AMBULATORY_CARE_PROVIDER_SITE_OTHER): Payer: Medicare HMO | Admitting: Internal Medicine

## 2017-08-17 VITALS — BP 130/84 | HR 78 | Ht 66.0 in | Wt 281.0 lb

## 2017-08-17 DIAGNOSIS — E890 Postprocedural hypothyroidism: Secondary | ICD-10-CM | POA: Diagnosis not present

## 2017-08-17 DIAGNOSIS — Z8639 Personal history of other endocrine, nutritional and metabolic disease: Secondary | ICD-10-CM | POA: Diagnosis not present

## 2017-08-17 LAB — TSH: TSH: 3.13 u[IU]/mL (ref 0.35–4.50)

## 2017-08-17 LAB — T3, FREE: T3 FREE: 3.5 pg/mL (ref 2.3–4.2)

## 2017-08-17 LAB — T4, FREE: FREE T4: 1.06 ng/dL (ref 0.60–1.60)

## 2017-08-17 MED ORDER — LEVOTHYROXINE SODIUM 50 MCG PO TABS: 75.0000 ug | ORAL_TABLET | Freq: Every day | ORAL | 2 refills | Status: DC

## 2017-08-17 NOTE — Progress Notes (Addendum)
[name] ID: Sophia Kelley, female   DOB: Jan 30, 1951, 66 y.o.   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 Last visit 3 mo ago.  Reviewed and addended history: Pt. has been dx with hypothyroidism in 2013; her TPO antibodies were found to be elevated, confirming Hashimoto thyroiditis. She was on Levothyroxine 137 mcg, now off for ~2 years.  She went to the hospital with SOB >> r/o PE and had a catheterization >> no CAD, normal EF. She had the cath on 11/01/2015.  Patient had thyrotoxic thyroid tests in 09/2015, after which she was referred to see me. By the time I saw her a month later her free T4 and free T3 were normal and her TSH was much better, therefore, we decided to recheck the thyroid tests in 1-1/2 months to see if they improve. They did not >> we checked an Uptake and scan:  Uptake and scan (01/28/2016):  There is uniform uptake within an enlarged thyroid gland no nodularity. The pyramidal lobe is faintly identified. 24 hour I 131 uptake = 38.8% (normal 10-30%) IMPRESSION: Imaging and thyroid uptake are most suggestive of Graves disease.  RAI tx (10/15/2016)  She started to feel poorly after her RAI treatment (thyrotoxic symptoms: Heat intolerance, sweating, tremors, palpitations) so we had to start back methimazole at 5 mg twice a day, then subsequently decreased to 5 mg daily and stopped at last visit. She is on metoprolol 50 mg daily.  Since then, she developed post-ablative hypothyroidism and was started on levothyroxine. However, she did not return for thyroid tests after we started levothyroxine in 12/2016!  After this, we adjusted her levothyroxine dose and recheck her TFTs until the last TSH returns normal in 06/2017.  Pt is on levothyroxine 75 mcg daily,  taken: - in am - fasting - at least 30 min from b'fast - no Ca, Fe, PPIs - + MVI infrequently - not on Biotin  She started to feel "jittery", dizzy, fatigue, and has swelling  After we increased her levothyroxine dose. She is wondering whether we can go back to the 50 g daily dose.  I reviewed pt's thyroid tests: Lab Results  Component Value Date   TSH 2.57 06/22/2017   TSH 4.81 (H) 05/11/2017   TSH 57.47 Repeated and verified X2. (H) 01/01/2017   TSH 0.44 10/06/2016   TSH 0.06 (L) 07/06/2016   TSH 0.15 (L) 04/02/2016   TSH 0.13 (L) 03/09/2016   TSH 0.27 (L) 01/03/2016   TSH 0.16 (L) 11/06/2015   TSH 2.66 07/02/2011   FREET4 0.96 06/22/2017   FREET4 0.94 05/11/2017   FREET4 0.31 (L) 01/01/2017   FREET4 2.25 (H) 10/06/2016   FREET4 2.26 (H) 07/06/2016   FREET4 1.73 (H) 04/02/2016   FREET4 2.41 (H) 03/09/2016   FREET4 1.91 (H) 01/03/2016   FREET4 1.15 11/06/2015   FREET4 0.95 02/20/2011   10/10/2015: TSH <0.006, total T3 3.3 (1-1.7), free T4 1.97 (0.82-1.77) - not on thyroid hormones at that time  02/20/2015: TSH 0.04, free T4 1.25  Lab Results  Component Value Date   TSI 331 (H) 11/06/2015   10/10/2015: TPO antibodies >1000  She has + FH of thyroid disorders in: mother, M aunts, all cousins - 2 relatives with Graves ds.No FH of thyroid cancer. No h/o radiation tx to head or neck.  No seaweed or kelp. No recent contrast studies. No herbal supplements. No Biotin use. No recent steroids use.   She has wrist gout >> was on Prednisone several years ago, no steroids recently.  ROS: Constitutional: + weight gain/no weight loss, + fatigue, + subjective hyperthermia, no subjective hypothermia Eyes: + blurry vision, no xerophthalmia ENT: no sore throat, no nodules palpated in throat, no dysphagia, no odynophagia, no hoarseness Cardiovascular: no CP/no SOB/no palpitations/+ leg swelling Respiratory: no cough/no SOB/no wheezing Gastrointestinal: no N/no V/no D/+ C/no acid  reflux Musculoskeletal: no muscle aches/no joint aches Skin: + rash - on face + hair loss Neurological: + tremors/no numbness/no tingling/no dizziness  I reviewed pt's medications, allergies, PMH, social hx, family hx, and changes were documented in the history of present illness. Otherwise, unchanged from my initial visit note.  Ophthalmologist: Dr. Ellie Lunch.  Past Medical History:  Diagnosis Date  . Arthritis    right arm  . Breast cancer of upper-outer quadrant of right female breast ([place]) 04/24/2016  . Colon polyps   . Complication of anesthesia    states was hard to wake up after 1 c-section  . Constipation   . Graves' disease    Radiation treatment  . Immature cataract   . Lumbar herniated disc   . Nasal congestion 05/08/2016  . Obesity   . Shortness of breath dyspnea    with ADLs - no home O2  . Sinus problem   . Sleep apnea    had sleep study, did not go back for CPAP fitting  . Tachycardia    states due to Graves' disease   Past Surgical History:  Procedure Laterality Date  . ABDOMINAL HYSTERECTOMY  1990   complete  . BREAST LUMPECTOMY WITH RADIOACTIVE SEED AND SENTINEL LYMPH NODE BIOPSY Right 05/15/2016   Procedure: RIGHT BREAST LUMPECTOMY WITH RADIOACTIVE SEED AND SENTINEL LYMPH NODE BIOPSY;  Surgeon: Autumn Messing III, MD;  Location: [facility];  Service: General;  Laterality: Right;  . CARDIAC CATHETERIZATION N/A 11/01/2015   Procedure: Right/Left Heart Cath and Coronary Angiography;  Surgeon: Larey Dresser, MD;  Location: [place] [facility];  Service: Cardiovascular;  Laterality: N/A;  . CESAREAN SECTION  [id number]   x 2  . COLONOSCOPY WITH PROPOFOL  06/12/2014  . LAPAROSCOPIC APPENDECTOMY N/A 01/31/2014   Procedure: APPENDECTOMY LAPAROSCOPIC;  Surgeon: Harl Bowie, MD;  Location: [facility];  Service: General;  Laterality: N/A;  . NASAL SEPTUM SURGERY  1984  . RECTOCELE REPAIR  09/01/2007   Social History   Social History  . Marital status:  Married    Spouse name: N/A  . Number of children: 2  . Years of education: 14   Occupational History  . unemployed   .  Unemployed   Social History Main Topics  . Smoking status: Former Smoker    Years: 40.00    Types: Cigarettes    Quit date: 05/14/2016  . Smokeless tobacco: Never Used     Comment: 5-6 cig./day  . Alcohol use No  . Drug use: No  . Sexual activity: Yes    Birth control/ protection: Surgical   Other Topics Concern  . Not on file   Social History Narrative   Caffeine Use - 1 cup coffee/day   Patient is right handed.         Current Outpatient Prescriptions on File Prior to Visit  Medication Sig Dispense Refill  . aspirin 81 MG tablet Take 81 mg by mouth daily.    Marland Kitchen  ibuprofen (ADVIL,MOTRIN) 100 MG tablet Take 200 mg by mouth every 8 (eight) hours as needed for fever.    . levothyroxine (SYNTHROID, LEVOTHROID) 75 MCG tablet Take 1 tablet (75 mcg total) by mouth daily. 60 tablet 1  . linaclotide (LINZESS) 290 MCG CAPS capsule Take 1 capsule (290 mcg total) by mouth daily. (Patient taking differently: Take 290 mcg by mouth every other day. ) 90 capsule 3  . Multiple Vitamin (MULTIVITAMIN) tablet Take 1 tablet by mouth daily.    Marland Kitchen nystatin-triamcinolone (MYCOLOG II) cream Apply 1 application topically 2 (two) times daily.    Marland Kitchen linaclotide (LINZESS) 290 MCG CAPS capsule Take 1 capsule (290 mcg total) by mouth daily before breakfast. (Patient not taking: Reported on 08/17/2017) 30 capsule 1  . metoprolol (LOPRESSOR) 50 MG tablet 1.5 tablets (75mg ) two times a day (Patient not taking: Reported on 08/17/2017) 270 tablet 3   No current facility-administered medications on file prior to visit.    Allergies  Allergen Reactions  . Codeine Rash   Family History  Problem Relation Age of Onset  . Heart attack Father 48       x3  . Allergies Father   . Arthritis Father   . Heart disease Father   . Prostate cancer Brother   . Thyroid disease Mother   . Hypertension  Mother   . Arthritis Mother   . Osteoporosis Mother    PE: BP 130/84   Pulse 78   Ht 5\' 6"  (1.676 m)   Wt 281 lb (127.5 kg)   LMP  (LMP Unknown)   SpO2 97%   BMI 45.35 kg/m  Body mass index is 45.35 kg/m. Wt Readings from Last 3 Encounters:  08/17/17 281 lb (127.5 kg)  05/11/17 270 lb (122.5 kg)  01/12/17 275 lb (124.7 kg)   Constitutional: obese, in NAD Eyes: PERRLA, EOMI, no exophthalmos ENT: moist mucous membranes, no thyromegaly, no cervical lymphadenopathy Cardiovascular: RRR, No MRG Respiratory: CTA B Gastrointestinal: abdomen soft, NT, ND, BS+ Musculoskeletal: no deformities, strength intact in all 4 Skin: moist, warm, no rashes Neurological: + tremor with outstretched hands, DTR normal in all 4  ASSESSMENT: 1.Post-ablative hypothyroidism  - after RAI treatment for Graves' disease    2. H/o Hashimoto's thyroiditis and H/o Graves ds.  3. Obesity - on the 1500 cal diet  PLAN:  1. And 2. Patient with history of Hashimoto's hypothyroidism, previously on levothyroxine therapy for several years, after which she developed Graves' disease in 2016. She was initially on methimazole, but she did not respond well to the medication so she ended up having RAI treatment in 10/2016. She had transient increased thyrotoxic symptoms after the treatment, during which we started methimazole. She eventually developed post-ablative hypothyroidism and we had to start levothyroxine. She had her first normal TFTs set in 06/2017, On 75 g levothyroxine daily.  -  however, patient describes that ever since we started this higher dose, she feels worse: She has more tremors, feels more swollen and also has dizziness. She is wondering whether we can back off to the 50 g tablet. We discussed that her symptoms may be due to her levothyroxine dose, but they may also be due to the excipients of the tablet. I suggested to switch to the 50 g tablets for 2 weeks and then to try to increase to 1.5 tablets  daily. If she does not feel good on this dose and she still requires a higher dose, we may need to switch to Synthroid  brand name. - we discussed about taking the thyroid hormone every day, with water, >30 minutes before breakfast, separated by >4 hours from acid reflux medications, calcium, iron, multivitamins. Pt. is taking it correctly - will check thyroid tests today: TSH and fT4 - If labs are abnormal, she will need to return for repeat TFTs in 1.5 months - OTW, RTC in 3 mo  3. Obesity - she gained weight since last visit >> strted on 1500 cal/day diet  Needs refills for 50 mcg.  Component     Latest Ref Rng & Units 08/17/2017  Triiodothyronine,Free,Serum     2.3 - 4.2 pg/mL 3.5  T4,Free(Direct)     0.60 - 1.60 ng/dL 1.06  TSH     0.35 - 4.50 uIU/mL 3.13  TFTs normal >> will continue with the plan to keep her on LT4 50 mcg x 2 weeks and then to try to increase to 1.5 tab of 50 mcg daily. Will recheck TFTs at next visit.  Lab Results  Component Value Date   TSI >700 (H) 08/17/2017   TSI 331 (H) 11/06/2015   The Graves antibodies are higher than before. No intervention is needed for now but we need to continue to monitor them.  Philemon Kingdom, MD PhD [facility] Endocrinology

## 2017-08-17 NOTE — Patient Instructions (Signed)
Please back off to 50 mcg daily for the next 2 weeks.  Please stop at the lab.  Please return in 3 months.

## 2017-08-19 ENCOUNTER — Telehealth: Payer: Self-pay | Admitting: Internal Medicine

## 2017-08-19 NOTE — Telephone Encounter (Signed)
It takes a little bit of time for her body to get use to the lower dose. Please ask her to hang in there. Thyroid tests were great. She may need to stay on the 50 mg weekly until she comes back for labs, if she cannot increase the dose further in 2 weeks.

## 2017-08-19 NOTE — Telephone Encounter (Signed)
Pt is starting to get sweaty and jittery on the 50 mcg she has only taken just 50 mcg today.

## 2017-08-21 LAB — THYROID STIMULATING IMMUNOGLOBULIN: TSI: >700 % baseline — ABNORMAL HIGH (ref <140)

## 2017-08-24 ENCOUNTER — Telehealth: Payer: Self-pay

## 2017-08-24 NOTE — Telephone Encounter (Signed)
LVM, gave lab results. Gave call back number if any questions or concerns.  

## 2017-08-24 NOTE — Telephone Encounter (Signed)
-----   Message from Philemon Kingdom, MD sent at 08/17/2017  5:11 PM EDT ----- Almyra Free, can you please call pt: TFTs normal >> will continue with the plan to keep her on LT4 50 mcg x 2 weeks (as I discussed with her at last visit) and then to try to increase to 1.5 tab of 50 mcg daily. Let me know if she still feels jittery on this dose after she starts. Will recheck TFTs at next visit.

## 2017-08-24 NOTE — Telephone Encounter (Signed)
-----   Message from Philemon Kingdom, MD sent at 08/24/2017 12:26 PM EDT ----- Almyra Free, can you please call pt: The Graves antibodies are higher than before. No intervention is needed for now but we need to continue to monitor them.

## 2017-09-03 ENCOUNTER — Other Ambulatory Visit: Payer: Self-pay | Admitting: General Surgery

## 2017-09-03 DIAGNOSIS — R109 Unspecified abdominal pain: Secondary | ICD-10-CM

## 2017-10-12 ENCOUNTER — Telehealth: Payer: Self-pay | Admitting: Gastroenterology

## 2017-10-12 ENCOUNTER — Telehealth: Payer: Self-pay

## 2017-10-12 MED ORDER — LINACLOTIDE 290 MCG PO CAPS: 290.0000 ug | ORAL_CAPSULE | Freq: Every day | ORAL | 3 refills | Status: DC

## 2017-10-12 NOTE — Telephone Encounter (Signed)
Yes that's fine thanks - 3 month supply with 3 refills

## 2017-10-12 NOTE — Telephone Encounter (Signed)
OK to refill Linzess  290 mcg (90 day)? Thanks.

## 2017-10-12 NOTE — Telephone Encounter (Signed)
Script sent to [facility] on [address].

## 2017-10-26 ENCOUNTER — Other Ambulatory Visit: Payer: Medicare HMO

## 2017-11-09 DIAGNOSIS — K8020 Calculus of gallbladder without cholecystitis without obstruction: Secondary | ICD-10-CM | POA: Insufficient documentation

## 2017-11-16 ENCOUNTER — Ambulatory Visit: Payer: Medicare HMO | Admitting: Internal Medicine

## 2017-12-16 ENCOUNTER — Other Ambulatory Visit: Payer: Self-pay

## 2017-12-16 ENCOUNTER — Ambulatory Visit: Payer: Medicare HMO | Admitting: Cardiology

## 2017-12-16 DIAGNOSIS — E039 Hypothyroidism, unspecified: Secondary | ICD-10-CM | POA: Insufficient documentation

## 2017-12-16 DIAGNOSIS — E038 Other specified hypothyroidism: Secondary | ICD-10-CM | POA: Insufficient documentation

## 2017-12-20 ENCOUNTER — Ambulatory Visit (INDEPENDENT_AMBULATORY_CARE_PROVIDER_SITE_OTHER): Payer: Medicare Other | Admitting: Cardiology

## 2017-12-20 ENCOUNTER — Encounter: Payer: Self-pay | Admitting: Cardiology

## 2017-12-20 ENCOUNTER — Other Ambulatory Visit: Payer: Self-pay

## 2017-12-20 VITALS — BP 118/84 | HR 102 | Ht 66.0 in | Wt 297.1 lb

## 2017-12-20 DIAGNOSIS — R002 Palpitations: Secondary | ICD-10-CM | POA: Diagnosis not present

## 2017-12-20 DIAGNOSIS — R0609 Other forms of dyspnea: Secondary | ICD-10-CM | POA: Diagnosis not present

## 2017-12-20 DIAGNOSIS — R0602 Shortness of breath: Secondary | ICD-10-CM | POA: Insufficient documentation

## 2017-12-20 NOTE — Progress Notes (Signed)
Cardiology Office Note:    Date:  12/20/2017   ID:  Sophia Kelley, DOB [birth year], MRN [id number]  PCP:  Delilah Shan, MD  Cardiologist:  Jenean Lindau, MD   Referring MD: Delilah Shan, MD    ASSESSMENT:    1. Palpitation   2. DOE (dyspnea on exertion)   3. Morbid obesity ([place])   4. Palpitations    PLAN:    In order of problems listed above:  1. Patient symptoms are significant.  She has shortness of breath on exertion.  Coronary artery disease is low on my list given the fact that coronary angiography was done almost a year and a half ago and was unremarkable.  Therefore I will evaluate her shortness of breath for the first getting a d-dimer test.  Also I will check thyroid and palpitations. 2. Patient will undergoing week of event monitoring to assess symptoms of palpitations week which occur pretty much on a daily basis. 3. Seen in follow-up appointment in 2 weeks or earlier if she has any concerns.  We will also check a thyroid in view of palpitations. 4. A d-dimer will be obtained to rule out any possible causes of pulmonary thromboembolism causing shortness of breath. 5. She is morbidly obese and risks of obesity explained in diet was discussed.  He knows to go to the nearest emergency room for any significant concerns.   Medication Adjustments/Labs and Tests Ordered: Current medicines are reviewed at length with the patient today.  Concerns regarding medicines are outlined above.  Orders Placed This Encounter  Procedures  . Basic metabolic panel  . TSH  . D-Dimer, Quantitative  . Cardiac event monitor  . EKG 12-Lead   No orders of the defined types were placed in this encounter.    History of Present Illness:    Sophia Kelley is a 67 y.o. female who is being seen today for the evaluation of palpitations and shortness of breath on exertion at the request of Delilah Shan, MD.  This patient is a pleasant 67 year old female.  She has past medical  history of essential hypertension and morbid obesity.  She has history of sleep apnea.  She also has history of Graves' disease and is has undergone radiation therapy for thyroid.  She is followed by her endocrinologist but for the past several months she has not had a thyroid check as she is in transition and moving to [place] from the local area.  She denies any chest pain orthopnea or PND.  At the time of my evaluation, the patient is alert awake oriented and in no distress.  Past Medical History:  Diagnosis Date  . Arthritis    right arm  . Breast cancer of upper-outer quadrant of right female breast ([place]) 04/24/2016  . Colon polyps   . Complication of anesthesia    states was hard to wake up after 1 c-section  . Constipation   . Graves' disease    Radiation treatment  . Immature cataract   . Lumbar herniated disc   . Nasal congestion 05/08/2016  . Obesity   . Shortness of breath dyspnea    with ADLs - no home O2  . Sinus problem   . Sleep apnea    had sleep study, did not go back for CPAP fitting  . Tachycardia    states due to Graves' disease    Past Surgical History:  Procedure Laterality Date  . ABDOMINAL HYSTERECTOMY  1990  complete  . BREAST LUMPECTOMY WITH RADIOACTIVE SEED AND SENTINEL LYMPH NODE BIOPSY Right 05/15/2016   Procedure: RIGHT BREAST LUMPECTOMY WITH RADIOACTIVE SEED AND SENTINEL LYMPH NODE BIOPSY;  Surgeon: Autumn Messing III, MD;  Location: [place];  Service: General;  Laterality: Right;  . CARDIAC CATHETERIZATION N/A 11/01/2015   Procedure: Right/Left Heart Cath and Coronary Angiography;  Surgeon: Larey Dresser, MD;  Location: [place] [facility];  Service: Cardiovascular;  Laterality: N/A;  . CESAREAN SECTION  [id number]   x 2  . COLONOSCOPY WITH PROPOFOL  06/12/2014  . LAPAROSCOPIC APPENDECTOMY N/A 01/31/2014   Procedure: APPENDECTOMY LAPAROSCOPIC;  Surgeon: Harl Bowie, MD;  Location: [facility];  Service: General;  Laterality: N/A;    . NASAL SEPTUM SURGERY  1984  . RECTOCELE REPAIR  09/01/2007    Current Medications: Current Meds  Medication Sig  . acetaminophen (TYLENOL) 500 MG tablet Take by mouth.  Marland Kitchen aspirin 81 MG tablet Take 81 mg by mouth daily.  . Cholecalciferol (VITAMIN D3) 1000 units CAPS Take by mouth.  . cyclobenzaprine (FLEXERIL) 5 MG tablet   . ibuprofen (ADVIL,MOTRIN) 100 MG tablet Take 200 mg by mouth every 8 (eight) hours as needed for fever.  . levothyroxine (SYNTHROID, LEVOTHROID) 50 MCG tablet Take 1.5 tablets (75 mcg total) by mouth daily.  Marland Kitchen linaclotide (LINZESS) 290 MCG CAPS capsule Take 1 capsule (290 mcg total) by mouth daily before breakfast.  . Multiple Vitamin (MULTIVITAMIN) tablet Take 1 tablet by mouth daily.  Marland Kitchen nystatin-triamcinolone (MYCOLOG II) cream Apply 1 application topically 2 (two) times daily.  . vitamin B-12 (CYANOCOBALAMIN) 1000 MCG tablet Take by mouth.  . [DISCONTINUED] nystatin cream (MYCOSTATIN) Apply topically.     Allergies:   Codeine   Social History   Socioeconomic History  . Marital status: Married    Spouse name: None  . Number of children: 2  . Years of education: 12  . Highest education level: None  Social Needs  . Financial resource strain: None  . Food insecurity - worry: None  . Food insecurity - inability: None  . Transportation needs - medical: None  . Transportation needs - non-medical: None  Occupational History  . Occupation: unemployed    Fish farm manager: UNEMPLOYED  Tobacco Use  . Smoking status: Former Smoker    Years: 40.00    Types: Cigarettes    Last attempt to quit: 05/14/2016    Years since quitting: 1.6  . Smokeless tobacco: Never Used  . Tobacco comment: 5-6 cig./day  Substance and Sexual Activity  . Alcohol use: No    Alcohol/week: 0.0 oz  . Drug use: No  . Sexual activity: Yes    Birth control/protection: Surgical  Other Topics Concern  . None  Social History Narrative   Caffeine Use - 1 cup coffee/day   Patient is right  handed.        Family History: The patient's family history includes Allergies in her father; Arthritis in her father and mother; Heart attack (age of onset: 77) in her father; Heart disease in her father; Hypertension in her mother; Osteoporosis in her mother; Prostate cancer in her brother; Thyroid disease in her mother.  ROS:   Please see the history of present illness.    All other systems reviewed and are negative.  EKGs/Labs/Other Studies Reviewed:    The following studies were reviewed today: I reviewed previous records including coronary angiography done in 2017 which revealed normal coronary arteries.  Peripheral vascular evaluation which was  unremarkable at that time.   Recent Labs: 08/17/2017: TSH 3.13  Recent Lipid Panel    Component Value Date/Time   CHOL 170 05/09/2015 0901   TRIG 186 (H) 05/09/2015 0901   HDL 46 05/09/2015 0901   CHOLHDL 3.7 05/09/2015 0901   VLDL 37 05/09/2015 0901   LDLCALC 87 05/09/2015 0901   LDLDIRECT 131.6 11/20/2010 1410    Physical Exam:    VS:  BP 118/84 (BP Location: Left Arm, Patient Position: Sitting, Cuff Size: Large)   Pulse (!) 102   Ht 5\' 6"  (1.676 m)   Wt 297 lb 1.9 oz (134.8 kg)   LMP  (LMP Unknown)   SpO2 97%   BMI 47.96 kg/m     Wt Readings from Last 3 Encounters:  12/20/17 297 lb 1.9 oz (134.8 kg)  08/17/17 281 lb (127.5 kg)  05/11/17 270 lb (122.5 kg)     GEN: Patient is in no acute distress HEENT: Normal NECK: No JVD; No carotid bruits LYMPHATICS: No lymphadenopathy CARDIAC: S1 S2 regular, 2/6 systolic murmur at the apex. RESPIRATORY:  Clear to auscultation without rales, wheezing or rhonchi  ABDOMEN: Soft, non-tender, non-distended MUSCULOSKELETAL:  No edema; No deformity  SKIN: Warm and dry NEUROLOGIC:  Alert and oriented x 3 PSYCHIATRIC:  Normal affect    Signed, Jenean Lindau, MD  12/20/2017 2:53 PM    [place]

## 2017-12-20 NOTE — Patient Instructions (Signed)
Medication Instructions:  Your physician recommends that you continue on your current medications as directed. Please refer to the Current Medication list given to you today.  Labwork: Your physician recommends that you have the following labs drawn: BMP, TSH, and ddimer  Testing/Procedures: Your physician has recommended that you wear an event monitor. Event monitors are medical devices that record the heart's electrical activity. Doctors most often [place] these monitors to diagnose arrhythmias. Arrhythmias are problems with the speed or rhythm of the heartbeat. The monitor is a small, portable device. You can wear one while you do your normal daily activities. This is usually used to diagnose what is causing palpitations/syncope (passing out).   Follow-Up: Your physician recommends that you schedule a follow-up appointment in: 2 weeks  Any Other Special Instructions Will Be Listed Below (If Applicable).     If you need a refill on your cardiac medications before your next appointment, please call your pharmacy.   Little Sturgeon, RN, BSN    Cardiac Event Monitoring A cardiac event monitor is a small recording device that is used to detect abnormal heart rhythms (arrhythmias). The monitor is used to record your heart rhythm when you have symptoms, such as:  Fast heartbeats (palpitations), such as heart racing or fluttering.  Dizziness.  Fainting or light-headedness.  Unexplained weakness.  Some monitors are wired to electrodes placed on your chest. Electrodes are flat, sticky disks that attach to your skin. Other monitors may be hand-held or worn on the wrist. The monitor can be worn for up to 30 days. If the monitor is attached to your chest, a technician will prepare your chest for the electrode placement and show you how to work the monitor. Take time to practice using the monitor before you leave the office. Make sure you understand how to send the information from  the monitor to your health care provider. In some cases, you may need to use a landline telephone instead of a cell phone. What are the risks? Generally, this device is safe to use, but it possible that the skin under the electrodes will become irritated. How to use your cardiac event monitor  Wear your monitor at all times, except when you are in water: ? Do not let the monitor get wet. ? Take the monitor off when you bathe. Do not swim or use a hot tub with it on.  Keep your skin clean. Do not put body lotion or moisturizer on your chest.  Change the electrodes as told by your health care provider or any time they stop sticking to your skin. You may need to use medical tape to keep them on.  Try to put the electrodes in slightly different places on your chest to help prevent skin irritation. They must remain in the area under your left breast and in the upper right section of your chest.  Make sure the monitor is safely clipped to your clothing or in a location close to your body that your health care provider recommends.  Press the button to record as soon as you feel heart-related symptoms, such as: ? Dizziness. ? Weakness. ? Light-headedness. ? Palpitations. ? Thumping or pounding in your chest. ? Shortness of breath. ? Unexplained weakness.  Keep a diary of your activities, such as walking, doing chores, and taking medicine. It is very important to note what you were doing when you pushed the button to record your symptoms. This will help your health care provider determine  what might be contributing to your symptoms.  Send the recorded information as recommended by your health care provider. It may take some time for your health care provider to process the results.  Change the batteries as told by your health care provider.  Keep electronic devices away from your monitor. This includes: ? Tablets. ? MP3 players. ? Cell phones.  While wearing your monitor you should  avoid: ? Electric blankets. ? Armed forces operational officer. ? Electric toothbrushes. ? Microwave ovens. ? Magnets. ? Metal detectors. Get help right away if:  You have chest pain.  You have extreme difficulty breathing or shortness of breath.  You develop a very fast heartbeat that persists.  You develop dizziness that does not go away.  You faint or constantly feel like you are about to faint. Summary  A cardiac event monitor is a small recording device that is used to help detect abnormal heart rhythms (arrhythmias).  The monitor is used to record your heart rhythm when you have heart-related symptoms.  Make sure you understand how to send the information from the monitor to your health care provider.  It is important to press the button on the monitor when you have any heart-related symptoms.  Keep a diary of your activities, such as walking, doing chores, and taking medicine. It is very important to note what you were doing when you pushed the button to record your symptoms. This will help your health care provider learn what might be causing your symptoms. This information is not intended to replace advice given to you by your health care provider. Make sure you discuss any questions you have with your health care provider. Document Released: 09/08/2008 Document Revised: 11/14/2016 Document Reviewed: 11/14/2016 Elsevier Interactive Patient Education  2017 [employer].

## 2017-12-21 ENCOUNTER — Ambulatory Visit (HOSPITAL_BASED_OUTPATIENT_CLINIC_OR_DEPARTMENT_OTHER)
Admission: RE | Admit: 2017-12-21 | Discharge: 2017-12-21 | Disposition: A | Discharge status: Home / Self Care | Payer: Medicare Other | Source: Ambulatory Visit | Attending: Cardiology | Admitting: Cardiology

## 2017-12-21 ENCOUNTER — Telehealth: Payer: Self-pay | Admitting: Cardiology

## 2017-12-21 ENCOUNTER — Other Ambulatory Visit: Payer: Self-pay

## 2017-12-21 DIAGNOSIS — I700 Atherosclerosis of aorta: Secondary | ICD-10-CM | POA: Insufficient documentation

## 2017-12-21 DIAGNOSIS — I2510 Atherosclerotic heart disease of native coronary artery without angina pectoris: Secondary | ICD-10-CM | POA: Diagnosis not present

## 2017-12-21 DIAGNOSIS — R7989 Other specified abnormal findings of blood chemistry: Secondary | ICD-10-CM

## 2017-12-21 DIAGNOSIS — J9811 Atelectasis: Secondary | ICD-10-CM | POA: Diagnosis not present

## 2017-12-21 DIAGNOSIS — K449 Diaphragmatic hernia without obstruction or gangrene: Secondary | ICD-10-CM | POA: Diagnosis not present

## 2017-12-21 LAB — BASIC METABOLIC PANEL
BUN/Creatinine Ratio: 15 (ref 12–28)
BUN: 14 mg/dL (ref 8–27)
CALCIUM: 9.3 mg/dL (ref 8.7–10.3)
CO2: 24 mmol/L (ref 20–29)
Chloride: 104 mmol/L (ref 96–106)
Creatinine, Ser: 0.94 mg/dL (ref 0.57–1.00)
GFR calc Af Amer: 73 mL/min/{1.73_m2} (ref >59)
GFR, EST NON AFRICAN AMERICAN: 63 mL/min/{1.73_m2} (ref >59)
GLUCOSE: 91 mg/dL (ref 65–99)
POTASSIUM: 5.1 mmol/L (ref 3.5–5.2)
SODIUM: 143 mmol/L (ref 134–144)

## 2017-12-21 LAB — TSH: TSH: 6.78 u[IU]/mL — AB (ref 0.450–4.500)

## 2017-12-21 LAB — D-DIMER, QUANTITATIVE (NOT AT ARMC): D-DIMER: 1.11 mg{FEU}/L — AB (ref 0.00–0.49)

## 2017-12-21 MED ORDER — IOPAMIDOL (ISOVUE-370) INJECTION 76%
100.0000 mL | Freq: Once | INTRAVENOUS | Status: AC | PRN
  Administered 2017-12-21: 100 mL via INTRAVENOUS

## 2017-12-21 NOTE — Telephone Encounter (Signed)
Spoke w/pt's husband.  Pt has Medicare and [employer] MCR supplement for 2019.  Pt no longer has [facility] for medical benefits.  [facility] is for Rx only.  He has my direct phone# and will call either today or tomorrow morning with pt's Medicare policy#.

## 2017-12-27 ENCOUNTER — Ambulatory Visit: Payer: Medicare Other

## 2017-12-27 ENCOUNTER — Other Ambulatory Visit: Payer: Self-pay

## 2017-12-27 ENCOUNTER — Telehealth: Payer: Self-pay

## 2017-12-27 DIAGNOSIS — M79604 Pain in right leg: Secondary | ICD-10-CM

## 2017-12-27 DIAGNOSIS — M79605 Pain in left leg: Principal | ICD-10-CM

## 2017-12-27 DIAGNOSIS — R002 Palpitations: Secondary | ICD-10-CM

## 2017-12-27 NOTE — Telephone Encounter (Signed)
Left voicemail for the patient to call the office regarding lower extremity testing.

## 2017-12-27 NOTE — Telephone Encounter (Signed)
Left voicemail informing patient of her appointment date and time for her VAS US of the lower extremities. Instructions for arrival and insurance left.

## 2018-01-03 ENCOUNTER — Ambulatory Visit (INDEPENDENT_AMBULATORY_CARE_PROVIDER_SITE_OTHER): Payer: Medicare Other | Admitting: Cardiology

## 2018-01-03 ENCOUNTER — Encounter: Payer: Self-pay | Admitting: Cardiology

## 2018-01-03 VITALS — BP 112/84 | HR 104 | Ht 66.0 in | Wt 298.1 lb

## 2018-01-03 DIAGNOSIS — R0602 Shortness of breath: Secondary | ICD-10-CM | POA: Diagnosis not present

## 2018-01-03 DIAGNOSIS — I7090 Unspecified atherosclerosis: Secondary | ICD-10-CM

## 2018-01-03 DIAGNOSIS — R002 Palpitations: Secondary | ICD-10-CM

## 2018-01-03 MED ORDER — METOPROLOL TARTRATE 25 MG PO TABS: 25.0000 mg | ORAL_TABLET | Freq: Two times a day (BID) | ORAL | 1 refills | Status: DC

## 2018-01-03 MED ORDER — ATORVASTATIN CALCIUM 10 MG PO TABS: 10.0000 mg | ORAL_TABLET | Freq: Every day | ORAL | 1 refills | Status: DC

## 2018-01-03 NOTE — Progress Notes (Signed)
Cardiology Office Note:    Date:  01/03/2018   ID:  Sophia Kelley, DOB 1951-01-22, MRN [id number]  PCP:  Susette Racer, MD  Cardiologist:  Jenean Lindau, MD   Referring MD: Delilah Shan, MD    ASSESSMENT:    1. Palpitations   2. Atherosclerotic vascular disease   3. Shortness of breath    PLAN:    In order of problems listed above:  1. I discussed my findings with her.  In view of atherosclerotic vascular disease Chem-7 and LFTs today to determine atorvastatin 10 mg at bedtime.  Risks were explained to the patient and she vocalized understanding.  Her LDL is elevated in view of her atherosclerotic disease statin therapy will help. 2. In view of shortness of breath I will give her a pulmonary consultation. 3. Patient underwent event monitoring and I will review test to see if she has any significant arrhythmias.   Medication Adjustments/Labs and Tests Ordered: Current medicines are reviewed at length with the patient today.  Concerns regarding medicines are outlined above.  Orders Placed This Encounter  Procedures  . Basic metabolic panel  . Hepatic function panel  . Ambulatory referral to Pulmonology   Meds ordered this encounter  Medications  . metoprolol tartrate (LOPRESSOR) 25 MG tablet    Sig: Take 1 tablet (25 mg total) by mouth 2 (two) times daily.    Dispense:  180 tablet    Refill:  1  . atorvastatin (LIPITOR) 10 MG tablet    Sig: Take 1 tablet (10 mg total) by mouth daily at 6 PM.    Dispense:  90 tablet    Refill:  1     Chief Complaint  Patient presents with  . Palpitations     History of Present Illness:    Sophia Kelley is a 67 y.o. female the patient was evaluated for palpitations.  She also has shortness of breath on exertion and morbid obesity.  Left heart catheterization was unremarkable in the past.  CT scan has been negative for pulmonary embolism.  She has atherosclerotic vascular disease evident by calcifications in her circulation  on the CT scan.  At the time of my evaluation, the patient is alert awake oriented and in no distress.  No chest pain orthopnea or PND but has significant shortness of breath on exertion.  Past Medical History:  Diagnosis Date  . Arthritis    right arm  . Breast cancer of upper-outer quadrant of right female breast ([place]) 04/24/2016  . Colon polyps   . Complication of anesthesia    states was hard to wake up after 1 c-section  . Constipation   . Graves' disease    Radiation treatment  . Immature cataract   . Lumbar herniated disc   . Nasal congestion 05/08/2016  . Obesity   . Shortness of breath dyspnea    with ADLs - no home O2  . Sinus problem   . Sleep apnea    had sleep study, did not go back for CPAP fitting  . Tachycardia    states due to Graves' disease    Past Surgical History:  Procedure Laterality Date  . ABDOMINAL HYSTERECTOMY  1990   complete  . BREAST LUMPECTOMY WITH RADIOACTIVE SEED AND SENTINEL LYMPH NODE BIOPSY Right 05/15/2016   Procedure: RIGHT BREAST LUMPECTOMY WITH RADIOACTIVE SEED AND SENTINEL LYMPH NODE BIOPSY;  Surgeon: Autumn Messing III, MD;  Location: [facility];  Service: General;  Laterality: Right;  .  CARDIAC CATHETERIZATION N/A 11/01/2015   Procedure: Right/Left Heart Cath and Coronary Angiography;  Surgeon: Larey Dresser, MD;  Location: [facility];  Service: Cardiovascular;  Laterality: N/A;  . CESAREAN SECTION  [id number]   x 2  . COLONOSCOPY WITH PROPOFOL  06/12/2014  . LAPAROSCOPIC APPENDECTOMY N/A 01/31/2014   Procedure: APPENDECTOMY LAPAROSCOPIC;  Surgeon: Harl Bowie, MD;  Location: [facility];  Service: General;  Laterality: N/A;  . NASAL SEPTUM SURGERY  1984  . RECTOCELE REPAIR  09/01/2007    Current Medications: Current Meds  Medication Sig  . acetaminophen (TYLENOL) 500 MG tablet Take by mouth.  Marland Kitchen aspirin 81 MG tablet Take 81 mg by mouth daily.  . Cholecalciferol (VITAMIN D3) 1000 units CAPS Take by mouth.  .  cyclobenzaprine (FLEXERIL) 5 MG tablet   . fluconazole (DIFLUCAN) 150 MG tablet Take 150 mg by mouth.  . gabapentin (NEURONTIN) 300 MG capsule Take 600 mg by mouth.  Marland Kitchen ibuprofen (ADVIL,MOTRIN) 100 MG tablet Take 200 mg by mouth every 8 (eight) hours as needed for fever.  . levothyroxine (SYNTHROID, LEVOTHROID) 100 MCG tablet Take 1 tablet by mouth daily.  Marland Kitchen linaclotide (LINZESS) 290 MCG CAPS capsule Take 1 capsule (290 mcg total) by mouth daily before breakfast.  . Multiple Vitamin (MULTIVITAMIN) tablet Take 1 tablet by mouth daily.  Marland Kitchen nystatin-triamcinolone (MYCOLOG II) cream Apply 1 application topically 2 (two) times daily.  Marland Kitchen terbinafine (LAMISIL) 1 % cream Apply topically.  . vitamin B-12 (CYANOCOBALAMIN) 1000 MCG tablet Take by mouth.     Allergies:   Codeine   Social History   Socioeconomic History  . Marital status: Married    Spouse name: None  . Number of children: 2  . Years of education: 72  . Highest education level: None  Social Needs  . Financial resource strain: None  . Food insecurity - worry: None  . Food insecurity - inability: None  . Transportation needs - medical: None  . Transportation needs - non-medical: None  Occupational History  . Occupation: unemployed    Fish farm manager: UNEMPLOYED  Tobacco Use  . Smoking status: Former Smoker    Years: 40.00    Types: Cigarettes    Last attempt to quit: 05/14/2016    Years since quitting: 1.6  . Smokeless tobacco: Never Used  . Tobacco comment: 5-6 cig./day  Substance and Sexual Activity  . Alcohol use: No    Alcohol/week: 0.0 oz  . Drug use: No  . Sexual activity: Yes    Birth control/protection: Surgical  Other Topics Concern  . None  Social History Narrative   Caffeine Use - 1 cup coffee/day   Patient is right handed.        Family History: The patient's family history includes Allergies in her father; Arthritis in her father and mother; Heart attack (age of onset: 67) in her father; Heart disease in her  father; Hypertension in her mother; Osteoporosis in her mother; Prostate cancer in her brother; Thyroid disease in her mother.  ROS:   Please see the history of present illness.    All other systems reviewed and are negative.  EKGs/Labs/Other Studies Reviewed:    The following studies were reviewed today: CT scan report was discussed with the patient at extensive length.   Recent Labs: 12/20/2017: BUN 14; Creatinine, Ser 0.94; Potassium 5.1; Sodium 143; TSH 6.780  Recent Lipid Panel    Component Value Date/Time   CHOL 170 05/09/2015 0901   TRIG 186 (H) 05/09/2015  0901   HDL 46 05/09/2015 0901   CHOLHDL 3.7 05/09/2015 0901   VLDL 37 05/09/2015 0901   LDLCALC 87 05/09/2015 0901   LDLDIRECT 131.6 11/20/2010 1410    Physical Exam:    VS:  BP 112/84 (BP Location: Left Arm, Patient Position: Sitting, Cuff Size: Large)   Pulse (!) 104   Ht 5\' 6"  (1.676 m)   Wt 298 lb 1.9 oz (135.2 kg)   LMP  (LMP Unknown)   SpO2 97%   BMI 48.12 kg/m     Wt Readings from Last 3 Encounters:  01/03/18 298 lb 1.9 oz (135.2 kg)  12/20/17 297 lb 1.9 oz (134.8 kg)  08/17/17 281 lb (127.5 kg)     GEN: Patient is in no acute distress HEENT: Normal NECK: No JVD; No carotid bruits LYMPHATICS: No lymphadenopathy CARDIAC: Hear sounds regular, 2/6 systolic murmur at the apex. RESPIRATORY:  Clear to auscultation without rales, wheezing or rhonchi  ABDOMEN: Soft, non-tender, non-distended MUSCULOSKELETAL:  No edema; No deformity  SKIN: Warm and dry NEUROLOGIC:  Alert and oriented x 3 PSYCHIATRIC:  Normal affect   Signed, Jenean Lindau, MD  01/03/2018 4:20 PM    [facility]

## 2018-01-03 NOTE — Patient Instructions (Addendum)
Medication Instructions:  Your physician has recommended you make the following change in your medication:  START Metoprolol succinate 25 mg twice daily START lipitor 10 mg every evening  Labwork: Your physician recommends that you return for lab work in: liver panel and BMP  Testing/Procedures: None  Follow-Up: Your physician recommends that you schedule a follow-up appointment in: 3 months  Any Other Special Instructions Will Be Listed Below (If Applicable).   Referral to pulmonary has been made they will call you with an appointment  If you need a refill on your cardiac medications before your next appointment, please call your pharmacy.   Rice, RN, BSN   Atorvastatin tablets What is this medicine? ATORVASTATIN (a TORE va sta tin) is known as a HMG-CoA reductase inhibitor or 'statin'. It lowers the level of cholesterol and triglycerides in the blood. This drug may also reduce the risk of heart attack, stroke, or other health problems in patients with risk factors for heart disease. Diet and lifestyle changes are often used with this drug. This medicine may be used for other purposes; ask your health care provider or pharmacist if you have questions. COMMON BRAND NAME(S): Lipitor What should I tell my health care provider before I take this medicine? They need to know if you have any of these conditions: -frequently drink alcoholic beverages -history of stroke, TIA -kidney disease -liver disease -muscle aches or weakness -other medical condition -an unusual or allergic reaction to atorvastatin, other medicines, foods, dyes, or preservatives -pregnant or trying to get pregnant -breast-feeding How should I use this medicine? Take this medicine by mouth with a glass of water. Follow the directions on the prescription label. You can take this medicine with or without food. Take your doses at regular intervals. Do not take your medicine more often than  directed. Talk to your pediatrician regarding the use of this medicine in children. While this drug may be prescribed for children as young as 64 years old for selected conditions, precautions do apply. Overdosage: If you think you have taken too much of this medicine contact a poison control center or emergency room at once. NOTE: This medicine is only for you. Do not share this medicine with others. What if I miss a dose? If you miss a dose, take it as soon as you can. If it is almost time for your next dose, take only that dose. Do not take double or extra doses. What may interact with this medicine? Do not take this medicine with any of the following medications: -red yeast rice -telaprevir -telithromycin -voriconazole This medicine may also interact with the following medications: -alcohol -antiviral medicines for HIV or AIDS -boceprevir -certain antibiotics like clarithromycin, erythromycin, troleandomycin -certain medicines for cholesterol like fenofibrate or gemfibrozil -cimetidine -clarithromycin -colchicine -cyclosporine -digoxin -female hormones, like estrogens or progestins and birth control pills -grapefruit juice -medicines for fungal infections like fluconazole, itraconazole, ketoconazole -niacin -rifampin -spironolactone This list may not describe all possible interactions. Give your health care provider a list of all the medicines, herbs, non-prescription drugs, or dietary supplements you use. Also tell them if you smoke, drink alcohol, or use illegal drugs. Some items may interact with your medicine. What should I watch for while using this medicine? Visit your doctor or health care professional for regular check-ups. You may need regular tests to make sure your liver is working properly. Tell your doctor or health care professional right away if you get any unexplained muscle pain, tenderness,  or weakness, especially if you also have a fever and tiredness. Your  doctor or health care professional may tell you to stop taking this medicine if you develop muscle problems. If your muscle problems do not go away after stopping this medicine, contact your health care professional. This drug is only part of a total heart-health program. Your doctor or a dietician can suggest a low-cholesterol and low-fat diet to help. Avoid alcohol and smoking, and keep a proper exercise schedule. Do not use this drug if you are pregnant or breast-feeding. Serious side effects to an unborn child or to an infant are possible. Talk to your doctor or pharmacist for more information. This medicine may affect blood sugar levels. If you have diabetes, check with your doctor or health care professional before you change your diet or the dose of your diabetic medicine. If you are going to have surgery tell your health care professional that you are taking this drug. What side effects may I notice from receiving this medicine? Side effects that you should report to your doctor or health care professional as soon as possible: -allergic reactions like skin rash, itching or hives, swelling of the face, lips, or tongue -dark urine -fever -joint pain -muscle cramps, pain -redness, blistering, peeling or loosening of the skin, including inside the mouth -trouble passing urine or change in the amount of urine -unusually weak or tired -yellowing of eyes or skin Side effects that usually do not require medical attention (report to your doctor or health care professional if they continue or are bothersome): -constipation -heartburn -stomach gas, pain, upset This list may not describe all possible side effects. Call your doctor for medical advice about side effects. You may report side effects to FDA at 1-800-FDA-1088. Where should I keep my medicine? Keep out of the reach of children. Store at room temperature between 20 to 25 degrees C (68 to 77 degrees F). Throw away any unused medicine  after the expiration date. NOTE: This sheet is a summary. It may not cover all possible information. If you have questions about this medicine, talk to your doctor, pharmacist, or health care provider.  2018 Elsevier/Gold Standard (2011-10-20 32:67:12) Metoprolol tablets What is this medicine? METOPROLOL (me TOE proe lole) is a beta-blocker. Beta-blockers reduce the workload on the heart and help it to beat more regularly. This medicine is used to treat high blood pressure and to prevent chest pain. It is also used to after a heart attack and to prevent an additional heart attack from occurring. This medicine may be used for other purposes; ask your health care provider or pharmacist if you have questions. COMMON BRAND NAME(S): Lopressor What should I tell my health care provider before I take this medicine? They need to know if you have any of these conditions: -diabetes -heart or vessel disease like slow heart rate, worsening heart failure, heart block, sick sinus syndrome or Raynaud's disease -kidney disease -liver disease -lung or breathing disease, like asthma or emphysema -pheochromocytoma -thyroid disease -an unusual or allergic reaction to metoprolol, other beta-blockers, medicines, foods, dyes, or preservatives -pregnant or trying to get pregnant -breast-feeding How should I use this medicine? Take this medicine by mouth with a drink of water. Follow the directions on the prescription label. Take this medicine immediately after meals. Take your doses at regular intervals. Do not take more medicine than directed. Do not stop taking this medicine suddenly. This could lead to serious heart-related effects. Talk to your pediatrician regarding the use  of this medicine in children. Special care may be needed. Overdosage: If you think you have taken too much of this medicine contact a poison control center or emergency room at once. NOTE: This medicine is only for you. Do not share this  medicine with others. What if I miss a dose? If you miss a dose, take it as soon as you can. If it is almost time for your next dose, take only that dose. Do not take double or extra doses. What may interact with this medicine? This medicine may interact with the following medications: -certain medicines for blood pressure, heart disease, irregular heart beat -certain medicines for depression like monoamine oxidase (MAO) inhibitors, fluoxetine, or paroxetine -clonidine -dobutamine -epinephrine -isoproterenol -reserpine This list may not describe all possible interactions. Give your health care provider a list of all the medicines, herbs, non-prescription drugs, or dietary supplements you use. Also tell them if you smoke, drink alcohol, or use illegal drugs. Some items may interact with your medicine. What should I watch for while using this medicine? Visit your doctor or health care professional for regular check ups. Contact your doctor right away if your symptoms worsen. Check your blood pressure and pulse rate regularly. Ask your health care professional what your blood pressure and pulse rate should be, and when you should contact them. You may get drowsy or dizzy. Do not drive, use machinery, or do anything that needs mental alertness until you know how this medicine affects you. Do not sit or stand up quickly, especially if you are an older patient. This reduces the risk of dizzy or fainting spells. Contact your doctor if these symptoms continue. Alcohol may interfere with the effect of this medicine. Avoid alcoholic drinks. What side effects may I notice from receiving this medicine? Side effects that you should report to your doctor or health care professional as soon as possible: -allergic reactions like skin rash, itching or hives -cold or numb hands or feet -depression -difficulty breathing -faint -fever with sore throat -irregular heartbeat, chest pain -rapid weight  gain -swollen legs or ankles Side effects that usually do not require medical attention (report to your doctor or health care professional if they continue or are bothersome): -anxiety or nervousness -change in sex drive or performance -dry skin -headache -nightmares or trouble sleeping -short term memory loss -stomach upset or diarrhea -unusually tired This list may not describe all possible side effects. Call your doctor for medical advice about side effects. You may report side effects to FDA at 1-800-FDA-1088. Where should I keep my medicine? Keep out of the reach of children. Store at room temperature between 15 and 30 degrees C (59 and 86 degrees F). Throw away any unused medicine after the expiration date. NOTE: This sheet is a summary. It may not cover all possible information. If you have questions about this medicine, talk to your doctor, pharmacist, or health care provider.  2018 Elsevier/Gold Standard (2013-08-04 14:40:36)

## 2018-01-03 NOTE — Addendum Note (Signed)
Addended by: Mattie Marlin on: 01/03/2018 04:29 PM   Modules accepted: Orders

## 2018-01-04 LAB — BASIC METABOLIC PANEL
BUN/Creatinine Ratio: 15 (ref 12–28)
BUN: 15 mg/dL (ref 8–27)
CALCIUM: 10 mg/dL (ref 8.7–10.3)
CHLORIDE: 100 mmol/L (ref 96–106)
CO2: 26 mmol/L (ref 20–29)
Creatinine, Ser: 0.97 mg/dL (ref 0.57–1.00)
GFR calc non Af Amer: 61 mL/min/{1.73_m2} (ref >59)
GFR, EST AFRICAN AMERICAN: 70 mL/min/{1.73_m2} (ref >59)
Glucose: 108 mg/dL — ABNORMAL HIGH (ref 65–99)
POTASSIUM: 5 mmol/L (ref 3.5–5.2)
SODIUM: 139 mmol/L (ref 134–144)

## 2018-01-04 LAB — HEPATIC FUNCTION PANEL
ALT: 13 IU/L (ref 0–32)
AST: 14 IU/L (ref 0–40)
Albumin: 4.7 g/dL (ref 3.6–4.8)
Alkaline Phosphatase: 93 IU/L (ref 39–117)
Bilirubin Total: 0.3 mg/dL (ref 0.0–1.2)
Bilirubin, Direct: 0.08 mg/dL (ref 0.00–0.40)
Total Protein: 7.5 g/dL (ref 6.0–8.5)

## 2018-01-07 ENCOUNTER — Ambulatory Visit (HOSPITAL_BASED_OUTPATIENT_CLINIC_OR_DEPARTMENT_OTHER)
Admission: RE | Admit: 2018-01-07 | Discharge: 2018-01-07 | Disposition: A | Discharge status: Home / Self Care | Payer: Medicare Other | Source: Ambulatory Visit | Attending: Cardiology | Admitting: Cardiology

## 2018-01-07 DIAGNOSIS — M79604 Pain in right leg: Secondary | ICD-10-CM | POA: Diagnosis not present

## 2018-01-07 DIAGNOSIS — M79605 Pain in left leg: Secondary | ICD-10-CM | POA: Diagnosis not present

## 2018-01-07 NOTE — Progress Notes (Signed)
  Bilateral lower extremity venous exam. No DVT seen. Sophia Kelley

## 2018-01-10 ENCOUNTER — Other Ambulatory Visit: Payer: Self-pay | Admitting: Obstetrics & Gynecology

## 2018-01-10 DIAGNOSIS — Z9889 Other specified postprocedural states: Secondary | ICD-10-CM

## 2018-02-21 ENCOUNTER — Institutional Professional Consult (permissible substitution): Payer: Medicare Other | Admitting: Pulmonary Disease

## 2018-03-11 ENCOUNTER — Telehealth: Payer: Self-pay

## 2018-03-11 ENCOUNTER — Telehealth: Payer: Self-pay | Admitting: Cardiology

## 2018-03-11 NOTE — Telephone Encounter (Signed)
Patient needs to have legs looked at, swelling, she is coming in for blood draw this aternoon.

## 2018-03-11 NOTE — Telephone Encounter (Signed)
Left voicemail informing the patient that her liver and lipid panel are overdue. Informed patient that she may have this done in our office at any time.

## 2018-03-14 NOTE — Telephone Encounter (Signed)
Patient arrived to the office on Friday afternoon and an appointment was made for Wednesday with Dr. Geraldo Pitter. Informed the patient that if her leg swelling worsened for her to seek emergency treatment.

## 2018-03-16 ENCOUNTER — Telehealth: Payer: Self-pay | Admitting: Pulmonary Disease

## 2018-03-16 ENCOUNTER — Encounter: Payer: Self-pay | Admitting: Cardiology

## 2018-03-16 ENCOUNTER — Ambulatory Visit (INDEPENDENT_AMBULATORY_CARE_PROVIDER_SITE_OTHER): Payer: Medicare Other | Admitting: Cardiology

## 2018-03-16 VITALS — BP 136/72 | HR 91 | Ht 66.0 in | Wt 310.0 lb

## 2018-03-16 DIAGNOSIS — G4733 Obstructive sleep apnea (adult) (pediatric): Secondary | ICD-10-CM | POA: Diagnosis not present

## 2018-03-16 DIAGNOSIS — I7090 Unspecified atherosclerosis: Secondary | ICD-10-CM

## 2018-03-16 MED ORDER — HYDROCHLOROTHIAZIDE 12.5 MG PO CAPS: 12.5000 mg | ORAL_CAPSULE | Freq: Every day | ORAL | 0 refills | Status: DC

## 2018-03-16 NOTE — Progress Notes (Signed)
Cardiology Office Note:    Date:  03/16/2018   ID:  Sophia Kelley, DOB [birth year], MRN [id number]  PCP:  Susette Racer, MD  Cardiologist:  Jenean Lindau, MD   Referring MD: Susette Racer, MD    ASSESSMENT:    1. Atherosclerotic vascular disease   2. Obstructive sleep apnea   3. Morbid obesity ([place])    PLAN:    In order of problems listed above:  1. Secondary prevention stressed with the patient.  Importance of compliance with diet and medications stressed and she vocalized understanding.  She has had a fasting lipid blood work done today and we will review it and discuss statin therapy accordingly.  I also advised the patient to see our pulmonology clinic colleague for pulmonary and sleep apnea evaluation.  She vocalized understanding and agrees. 2. In view of her pedal edema I have initiated her on hydrochlorothiazide 12.5 mg daily.  The patient will be back in 2 weeks for pulse and blood pressure check.Patient will be seen in follow-up appointment in 6 months or earlier if the patient has any concerns    Medication Adjustments/Labs and Tests Ordered: Current medicines are reviewed at length with the patient today.  Concerns regarding medicines are outlined above.  No orders of the defined types were placed in this encounter.  Meds ordered this encounter  Medications  . hydrochlorothiazide (MICROZIDE) 12.5 MG capsule    Sig: Take 1 capsule (12.5 mg total) by mouth daily.    Dispense:  30 capsule    Refill:  0    Will send more refills once patient follows up to ensure patient safety     Chief Complaint  Patient presents with  . Follow-up  . Palpitations     History of Present Illness:    Sophia Kelley is a 67 y.o. female.  The patient has past medical history of obstructive sleep apnea.  She was evaluated for palpitations.  Her monitoring has come out to be unremarkable.  She denies any problems at this time and no chest pain orthopnea or PND.  She has not seen  a pulmonologist.  She has had sleep apnea issues since a long time.  Past Medical History:  Diagnosis Date  . Arthritis    right arm  . Breast cancer of upper-outer quadrant of right female breast ([place]) 04/24/2016  . Colon polyps   . Complication of anesthesia    states was hard to wake up after 1 c-section  . Constipation   . Graves' disease    Radiation treatment  . Immature cataract   . Lumbar herniated disc   . Nasal congestion 05/08/2016  . Obesity   . Shortness of breath dyspnea    with ADLs - no home O2  . Sinus problem   . Sleep apnea    had sleep study, did not go back for CPAP fitting  . Tachycardia    states due to Graves' disease    Past Surgical History:  Procedure Laterality Date  . ABDOMINAL HYSTERECTOMY  1990   complete  . BREAST LUMPECTOMY WITH RADIOACTIVE SEED AND SENTINEL LYMPH NODE BIOPSY Right 05/15/2016   Procedure: RIGHT BREAST LUMPECTOMY WITH RADIOACTIVE SEED AND SENTINEL LYMPH NODE BIOPSY;  Surgeon: Autumn Messing III, MD;  Location: [facility];  Service: General;  Laterality: Right;  . CARDIAC CATHETERIZATION N/A 11/01/2015   Procedure: Right/Left Heart Cath and Coronary Angiography;  Surgeon: Larey Dresser, MD;  Location: [facility];  Service: Cardiovascular;  Laterality: N/A;  . CESAREAN SECTION  [id number]   x 2  . COLONOSCOPY WITH PROPOFOL  06/12/2014  . LAPAROSCOPIC APPENDECTOMY N/A 01/31/2014   Procedure: APPENDECTOMY LAPAROSCOPIC;  Surgeon: Harl Bowie, MD;  Location: [facility];  Service: General;  Laterality: N/A;  . NASAL SEPTUM SURGERY  1984  . RECTOCELE REPAIR  09/01/2007    Current Medications: Current Meds  Medication Sig  . acetaminophen (TYLENOL) 500 MG tablet Take by mouth.  Marland Kitchen aspirin 81 MG tablet Take 81 mg by mouth daily.  Marland Kitchen atorvastatin (LIPITOR) 10 MG tablet Take 1 tablet (10 mg total) by mouth daily at 6 PM.  . Cholecalciferol (VITAMIN D3) 1000 units CAPS Take by mouth.  . cyanocobalamin (,VITAMIN B-12,)  1000 MCG/ML injection Inject into the muscle.  . cyclobenzaprine (FLEXERIL) 5 MG tablet Take 5 mg by mouth as needed.   Marland Kitchen ibuprofen (ADVIL,MOTRIN) 100 MG tablet Take 200 mg by mouth every 8 (eight) hours as needed for fever.  . levothyroxine (SYNTHROID, LEVOTHROID) 100 MCG tablet Take 1 tablet by mouth daily.  Marland Kitchen linaclotide (LINZESS) 290 MCG CAPS capsule Take 1 capsule (290 mcg total) by mouth daily before breakfast. (Patient taking differently: Take 290 mcg by mouth daily before breakfast. )  . metFORMIN (GLUCOPHAGE) 500 MG tablet Take by mouth.  . metoprolol tartrate (LOPRESSOR) 25 MG tablet Take 1 tablet (25 mg total) by mouth 2 (two) times daily.  . Multiple Vitamin (MULTIVITAMIN) tablet Take 1 tablet by mouth daily.  . nitroGLYCERIN (NITROSTAT) 0.4 MG SL tablet Place under the tongue.  . nystatin-triamcinolone (MYCOLOG II) cream Apply 1 application topically 2 (two) times daily.  Marland Kitchen terbinafine (LAMISIL) 1 % cream Apply topically.  . [DISCONTINUED] fluconazole (DIFLUCAN) 150 MG tablet Take 150 mg by mouth.  . [DISCONTINUED] vitamin B-12 (CYANOCOBALAMIN) 1000 MCG tablet Take by mouth.     Allergies:   Codeine   Social History   Socioeconomic History  . Marital status: Married    Spouse name: Not on file  . Number of children: 2  . Years of education: 31  . Highest education level: Not on file  Occupational History  . Occupation: unemployed    Fish farm manager: UNEMPLOYED  Social Needs  . Financial resource strain: Not on file  . Food insecurity:    Worry: Not on file    Inability: Not on file  . Transportation needs:    Medical: Not on file    Non-medical: Not on file  Tobacco Use  . Smoking status: Former Smoker    Years: 40.00    Types: Cigarettes    Last attempt to quit: 05/14/2016    Years since quitting: 1.8  . Smokeless tobacco: Never Used  . Tobacco comment: 5-6 cig./day  Substance and Sexual Activity  . Alcohol use: No    Alcohol/week: 0.0 oz  . Drug use: No  . Sexual  activity: Yes    Birth control/protection: Surgical  Lifestyle  . Physical activity:    Days per week: Not on file    Minutes per session: Not on file  . Stress: Not on file  Relationships  . Social connections:    Talks on phone: Not on file    Gets together: Not on file    Attends religious service: Not on file    Active member of club or organization: Not on file    Attends meetings of clubs or organizations: Not on file    Relationship status: Not on file  Other Topics Concern  . Not on file  Social History Narrative   Caffeine Use - 1 cup coffee/day   Patient is right handed.        Family History: The patient's family history includes Allergies in her father; Arthritis in her father and mother; Heart attack (age of onset: 25) in her father; Heart disease in her father; Hypertension in her mother; Osteoporosis in her mother; Prostate cancer in her brother; Thyroid disease in her mother.  ROS:   Please see the history of present illness.    All other systems reviewed and are negative.  EKGs/Labs/Other Studies Reviewed:    The following studies were reviewed today: I reviewed the event monitor notes with the patient at length.   Recent Labs: 12/20/2017: TSH 6.780 01/03/2018: ALT 13; BUN 15; Creatinine, Ser 0.97; Potassium 5.0; Sodium 139  Recent Lipid Panel    Component Value Date/Time   CHOL 170 05/09/2015 0901   TRIG 186 (H) 05/09/2015 0901   HDL 46 05/09/2015 0901   CHOLHDL 3.7 05/09/2015 0901   VLDL 37 05/09/2015 0901   LDLCALC 87 05/09/2015 0901   LDLDIRECT 131.6 11/20/2010 1410    Physical Exam:    VS:  BP 136/72 (BP Location: Left Arm, Patient Position: Sitting, Cuff Size: Normal)   Pulse 91   Ht 5\' 6"  (1.676 m)   Wt (!) 310 lb (140.6 kg)   LMP  (LMP Unknown)   SpO2 97%   BMI 50.04 kg/m     Wt Readings from Last 3 Encounters:  03/16/18 (!) 310 lb (140.6 kg)  01/03/18 298 lb 1.9 oz (135.2 kg)  12/20/17 297 lb 1.9 oz (134.8 kg)     GEN: Patient  is in no acute distress HEENT: Normal NECK: No JVD; No carotid bruits LYMPHATICS: No lymphadenopathy CARDIAC: Hear sounds regular, 2/6 systolic murmur at the apex. RESPIRATORY:  Clear to auscultation without rales, wheezing or rhonchi  ABDOMEN: Soft, non-tender, non-distended MUSCULOSKELETAL:  No edema; No deformity  SKIN: Warm and dry NEUROLOGIC:  Alert and oriented x 3 PSYCHIATRIC:  Normal affect   Signed, Jenean Lindau, MD  03/16/2018 9:00 AM    [facility]

## 2018-03-16 NOTE — Patient Instructions (Addendum)
Medication Instructions:  Your physician has recommended you make the following change in your medication:  START hydrochlorothiazide 12.5 mg daily  Labwork: Your physician recommends that you have the following labs drawn: Labs done today; please return in 2 weeks for BMP.  Testing/Procedures: None  Follow-Up: Your physician recommends that you schedule a follow-up appointment in: 6 months with Dr. Geraldo Pitter and 2 weeks with nurse for pulse, blood pressure and BMP.  Any Other Special Instructions Will Be Listed Below (If Applicable).  A message will be sent to pulmonary for referral.   If you need a refill on your cardiac medications before your next appointment, please call your pharmacy.   Angier, RN, BSN   Hydrochlorothiazide, HCTZ capsules or tablets What is this medicine? HYDROCHLOROTHIAZIDE (hye droe klor oh THYE a zide) is a diuretic. It increases the amount of urine passed, which causes the body to lose salt and water. This medicine is used to treat high blood pressure. It is also reduces the swelling and water retention caused by various medical conditions, such as heart, liver, or kidney disease. This medicine may be used for other purposes; ask your health care provider or pharmacist if you have questions. COMMON BRAND NAME(S): Esidrix, Ezide, HydroDIURIL, Microzide, Oretic, Zide What should I tell my health care provider before I take this medicine? They need to know if you have any of these conditions: -diabetes -gout -immune system problems, like lupus -kidney disease or kidney stones -liver disease -pancreatitis -small amount of urine or difficulty passing urine -an unusual or allergic reaction to hydrochlorothiazide, sulfa drugs, other medicines, foods, dyes, or preservatives -pregnant or trying to get pregnant -breast-feeding How should I use this medicine? Take this medicine by mouth with a glass of water. Follow the directions on the  prescription label. Take your medicine at regular intervals. Remember that you will need to pass urine frequently after taking this medicine. Do not take your doses at a time of day that will cause you problems. Do not stop taking your medicine unless your doctor tells you to. Talk to your pediatrician regarding the use of this medicine in children. Special care may be needed. Overdosage: If you think you have taken too much of this medicine contact a poison control center or emergency room at once. NOTE: This medicine is only for you. Do not share this medicine with others. What if I miss a dose? If you miss a dose, take it as soon as you can. If it is almost time for your next dose, take only that dose. Do not take double or extra doses. What may interact with this medicine? -cholestyramine -colestipol -digoxin -dofetilide -lithium -medicines for blood pressure -medicines for diabetes -medicines that relax muscles for surgery -other diuretics -steroid medicines like prednisone or cortisone This list may not describe all possible interactions. Give your health care provider a list of all the medicines, herbs, non-prescription drugs, or dietary supplements you use. Also tell them if you smoke, drink alcohol, or use illegal drugs. Some items may interact with your medicine. What should I watch for while using this medicine? Visit your doctor or health care professional for regular checks on your progress. Check your blood pressure as directed. Ask your doctor or health care professional what your blood pressure should be and when you should contact him or her. You may need to be on a special diet while taking this medicine. Ask your doctor. Check with your doctor or health care  professional if you get an attack of severe diarrhea, nausea and vomiting, or if you sweat a lot. The loss of too much body fluid can make it dangerous for you to take this medicine. You may get drowsy or dizzy. Do not  drive, use machinery, or do anything that needs mental alertness until you know how this medicine affects you. Do not stand or sit up quickly, especially if you are an older patient. This reduces the risk of dizzy or fainting spells. Alcohol may interfere with the effect of this medicine. Avoid alcoholic drinks. This medicine may affect your blood sugar level. If you have diabetes, check with your doctor or health care professional before changing the dose of your diabetic medicine. This medicine can make you more sensitive to the sun. Keep out of the sun. If you cannot avoid being in the sun, wear protective clothing and use sunscreen. Do not use sun lamps or tanning beds/booths. What side effects may I notice from receiving this medicine? Side effects that you should report to your doctor or health care professional as soon as possible: -allergic reactions such as skin rash or itching, hives, swelling of the lips, mouth, tongue, or throat -changes in vision -chest pain -eye pain -fast or irregular heartbeat -feeling faint or lightheaded, falls -gout attack -muscle pain or cramps -pain or difficulty when passing urine -pain, tingling, numbness in the hands or feet -redness, blistering, peeling or loosening of the skin, including inside the mouth -unusually weak or tired Side effects that usually do not require medical attention (report to your doctor or health care professional if they continue or are bothersome): -change in sex drive or performance -dry mouth -headache -stomach upset This list may not describe all possible side effects. Call your doctor for medical advice about side effects. You may report side effects to FDA at 1-800-FDA-1088. Where should I keep my medicine? Keep out of the reach of children. Store at room temperature between 15 and 30 degrees C (59 and 86 degrees F). Do not freeze. Protect from light and moisture. Keep container closed tightly. Throw away any unused  medicine after the expiration date. NOTE: This sheet is a summary. It may not cover all possible information. If you have questions about this medicine, talk to your doctor, pharmacist, or health care provider.  2018 Elsevier/Gold Standard (2010-07-25 12:57:37)

## 2018-03-16 NOTE — Telephone Encounter (Signed)
LVM for patient regarding cardoilogy referral to our office Caryl Pina with [facility] cardiology contacted office today needing pt be seen by [facility] for pulm & Sleep at [place] location Pt had an appt with [facility] for just pulm on 02/19/2018 and cancelled.  Pt will need to be scheduled in next few weeks for sleep and pulm with [facility] in [place] on Thurs mornings.

## 2018-03-17 LAB — LIPID PANEL
CHOLESTEROL TOTAL: 163 mg/dL (ref 100–199)
Chol/HDL Ratio: 3.1 ratio (ref 0.0–4.4)
HDL: 53 mg/dL (ref >39)
LDL Calculated: 82 mg/dL (ref 0–99)
Triglycerides: 140 mg/dL (ref 0–149)
VLDL Cholesterol Cal: 28 mg/dL (ref 5–40)

## 2018-03-17 LAB — HEPATIC FUNCTION PANEL
ALK PHOS: 82 IU/L (ref 39–117)
ALT: 17 IU/L (ref 0–32)
AST: 14 IU/L (ref 0–40)
Albumin: 4 g/dL (ref 3.6–4.8)
BILIRUBIN, DIRECT: 0.09 mg/dL (ref 0.00–0.40)
Bilirubin Total: 0.3 mg/dL (ref 0.0–1.2)
Total Protein: 6.8 g/dL (ref 6.0–8.5)

## 2018-03-29 NOTE — Telephone Encounter (Signed)
Left message for patient to call back to get scheduled.  

## 2018-03-30 ENCOUNTER — Ambulatory Visit: Payer: Medicare Other

## 2018-04-04 ENCOUNTER — Ambulatory Visit
Admission: RE | Admit: 2018-04-04 | Discharge: 2018-04-04 | Disposition: A | Discharge status: Home / Self Care | Payer: Medicare Other | Source: Ambulatory Visit | Attending: Obstetrics & Gynecology | Admitting: Obstetrics & Gynecology

## 2018-04-04 DIAGNOSIS — Z9889 Other specified postprocedural states: Secondary | ICD-10-CM

## 2018-04-06 NOTE — Telephone Encounter (Signed)
lmtcb x2 for pt. 

## 2018-04-08 NOTE — Telephone Encounter (Signed)
Attempted to contact pt several times no answer for pulm and sleep consult Mailing letter to pt today for additional contact Nothing further needed at this time

## 2018-04-11 DIAGNOSIS — M1612 Unilateral primary osteoarthritis, left hip: Secondary | ICD-10-CM | POA: Insufficient documentation

## 2018-04-11 DIAGNOSIS — M5136 Other intervertebral disc degeneration, lumbar region: Secondary | ICD-10-CM | POA: Insufficient documentation

## 2018-04-11 DIAGNOSIS — M47816 Spondylosis without myelopathy or radiculopathy, lumbar region: Secondary | ICD-10-CM | POA: Insufficient documentation

## 2018-05-03 DIAGNOSIS — M79652 Pain in left thigh: Secondary | ICD-10-CM | POA: Insufficient documentation

## 2018-05-05 DIAGNOSIS — L089 Local infection of the skin and subcutaneous tissue, unspecified: Secondary | ICD-10-CM | POA: Insufficient documentation

## 2018-06-07 DIAGNOSIS — R600 Localized edema: Secondary | ICD-10-CM | POA: Insufficient documentation

## 2018-07-07 DIAGNOSIS — R7303 Prediabetes: Secondary | ICD-10-CM | POA: Insufficient documentation

## 2018-08-30 ENCOUNTER — Ambulatory Visit: Payer: Medicare Other | Admitting: Gastroenterology

## 2018-10-06 DIAGNOSIS — M25462 Effusion, left knee: Secondary | ICD-10-CM | POA: Insufficient documentation

## 2018-10-10 DIAGNOSIS — M25061 Hemarthrosis, right knee: Secondary | ICD-10-CM | POA: Insufficient documentation

## 2018-12-28 DIAGNOSIS — I872 Venous insufficiency (chronic) (peripheral): Secondary | ICD-10-CM | POA: Insufficient documentation

## 2019-01-03 ENCOUNTER — Telehealth: Payer: Self-pay | Admitting: Gastroenterology

## 2019-01-03 NOTE — Telephone Encounter (Signed)
LM for pt to call back.

## 2019-01-03 NOTE — Telephone Encounter (Signed)
Pt husband called in stating that his wife has been taking her linzess and laxatives and still has not been able to produce a bowel movement for 5-6days and she is wanting a prescribe laxative called into the phama to help the pt have bowel movement.

## 2019-01-03 NOTE — Telephone Encounter (Signed)
Thanks for the update. I think she should take Miralax on top of the linzess to make her use the bathroom, can take as much as needed. If she's in pain or vomiting, or acting obstructed let me know. We can also give dulcolax suppository or PO dulcolax if she wants something else. I think an enema would be useful if she is able to do that. If they can specify what else they would like I'm happy to provide that. I have not seen her since 2017, she stated at that time Linzess had worked the best for her of options she had been on in the past. Thanks

## 2019-01-03 NOTE — Telephone Encounter (Signed)
Called and spoke to pt's husband.  She is taking Linzess 267mcg daily.  She is unable to do a fleet enema because of "physical limitation to get back there" and he is unable to help her because he is in a wheelchair so he said that is not an option.  I encouraged him to have her add Miralax to the [place], beginning with 2 capfuls now, as many as needed to produce a BM. He indicated he wanted something other than an enema and "just Miralax" which she has tried in the past.  I confirmed that she has Not been taking them together however and I encouraged him to have her add that to the [place].  He was not satisfied with that and indicated that she was doing fine on the regime Dr. Olevia Perches had her on when she had her doing colons every 5 years but that Dr. Havery Moros refused to do that.  I informed him that her last one was in June of 2016 and she has a recall in place for June of this year. I asked to schedule her an appt with [facility]. Dr. Doyne Keel first available is 02-03-19 and he didn't want to wait that long but refused an appt with an APP.  I am placing her on a wait list for sooner but has to have a late afternoon appt.

## 2019-01-03 NOTE — Telephone Encounter (Signed)
She should be on Linzess 210mcg every day, hopefully she is doing that.  If not BM for that time frame reported recommend fleet enema x 2, which she can purchase OTC. Also recommend she use Miralax 2 capfulls now and then as needed in addition to [place] (she can take as much MIralax as needed to produce a BM). She has not been seen here in 2 years, would be good to schedule her a follow up visit. Thanks

## 2019-01-04 NOTE — Telephone Encounter (Signed)
Called and Left detailed message for pt regarding ALL recommendations and cautions.  Asked them to call me back if any pain, vomiting, etc.  Also reiterated we have scheduled an appt, put on the waitlist but they should call and get in with App if she wants to be seen sooner.

## 2019-01-05 ENCOUNTER — Other Ambulatory Visit: Payer: Self-pay

## 2019-01-05 MED ORDER — LINACLOTIDE 290 MCG PO CAPS: 290.0000 ug | ORAL_CAPSULE | Freq: Every day | ORAL | 0 refills | Status: DC

## 2019-01-05 NOTE — Progress Notes (Signed)
rec'd fax request from [facility] in [facility]  Pt has appt with Dr. Havery Moros next month on 2-21.

## 2019-02-01 ENCOUNTER — Encounter

## 2019-02-03 ENCOUNTER — Ambulatory Visit: Payer: Medicare Other | Admitting: Gastroenterology

## 2019-02-21 DIAGNOSIS — F320 Major depressive disorder, single episode, mild: Secondary | ICD-10-CM | POA: Insufficient documentation

## 2019-02-27 ENCOUNTER — Other Ambulatory Visit: Payer: Self-pay | Admitting: Obstetrics & Gynecology

## 2019-02-27 DIAGNOSIS — Z853 Personal history of malignant neoplasm of breast: Secondary | ICD-10-CM

## 2019-03-10 ENCOUNTER — Ambulatory Visit: Payer: Medicare Other | Admitting: Gastroenterology

## 2019-03-10 ENCOUNTER — Encounter

## 2019-05-17 ENCOUNTER — Other Ambulatory Visit: Payer: Self-pay

## 2019-05-29 DIAGNOSIS — I10 Essential (primary) hypertension: Secondary | ICD-10-CM | POA: Insufficient documentation

## 2019-06-01 ENCOUNTER — Encounter: Payer: Self-pay | Admitting: Gastroenterology

## 2019-06-01 ENCOUNTER — Ambulatory Visit
Admission: RE | Admit: 2019-06-01 | Discharge: 2019-06-01 | Disposition: A | Discharge status: Home / Self Care | Payer: Medicare Other | Source: Ambulatory Visit | Attending: Obstetrics & Gynecology | Admitting: Obstetrics & Gynecology

## 2019-06-01 ENCOUNTER — Other Ambulatory Visit: Payer: Self-pay

## 2019-06-01 DIAGNOSIS — Z853 Personal history of malignant neoplasm of breast: Secondary | ICD-10-CM

## 2019-06-29 ENCOUNTER — Ambulatory Visit (INDEPENDENT_AMBULATORY_CARE_PROVIDER_SITE_OTHER): Payer: Medicare Other

## 2019-06-29 ENCOUNTER — Ambulatory Visit (INDEPENDENT_AMBULATORY_CARE_PROVIDER_SITE_OTHER): Payer: Medicare Other | Admitting: Podiatry

## 2019-06-29 ENCOUNTER — Other Ambulatory Visit: Payer: Self-pay

## 2019-06-29 ENCOUNTER — Encounter: Payer: Self-pay | Admitting: Podiatry

## 2019-06-29 ENCOUNTER — Ambulatory Visit: Payer: PRIVATE HEALTH INSURANCE | Admitting: Podiatry

## 2019-06-29 DIAGNOSIS — L03119 Cellulitis of unspecified part of limb: Secondary | ICD-10-CM | POA: Diagnosis not present

## 2019-06-29 DIAGNOSIS — S90851A Superficial foreign body, right foot, initial encounter: Secondary | ICD-10-CM | POA: Diagnosis not present

## 2019-06-29 DIAGNOSIS — L02619 Cutaneous abscess of unspecified foot: Secondary | ICD-10-CM

## 2019-06-29 MED ORDER — MUPIROCIN 2 % EX OINT: TOPICAL_OINTMENT | CUTANEOUS | 1 refills | Status: DC

## 2019-06-29 MED ORDER — AMOXICILLIN-POT CLAVULANATE 875-125 MG PO TABS: 1.0000 | ORAL_TABLET | Freq: Two times a day (BID) | ORAL | 0 refills | Status: DC

## 2019-06-29 MED ORDER — OXYCODONE-ACETAMINOPHEN 10-325 MG PO TABS: 1.0000 | ORAL_TABLET | ORAL | 0 refills | Status: DC | PRN

## 2019-06-29 NOTE — Patient Instructions (Signed)
Epsom Salt Soak Instructions   Place 1/4 cup of epsom salts in a quart of warm tap water.  Soak your foot or feet in the solution for 20 minutes twice a day until you notice the area has dried and a scab has formed. Continue to apply other medications to the area as directed by the doctor such as polysporin, neosporin or cortisporin drops.  IF YOUR SKIN BECOMES IRRITATED WHILE USING THESE INSTRUCTIONS, IT IS OKAY TO SWITCH TO  WHITE VINEGAR AND WATER. Or you may use antibacterial soap and water to keep the toe clean  Monitor for any signs/symptoms of infection. Call the office immediately if any occur or go directly to the emergency room. Call with any questions/concerns. 

## 2019-06-30 ENCOUNTER — Telehealth: Payer: Self-pay | Admitting: *Deleted

## 2019-06-30 NOTE — Telephone Encounter (Signed)
I called Gaynelle Arabian and she states all opioids must be sent electronically and the dose morphine is too high and can have every 8 hours. I told Belenda Cruise I would inform the doctors.

## 2019-06-30 NOTE — Telephone Encounter (Signed)
[facility] - Sophia Kelley states percocet rx in [place] must be sent electronically.

## 2019-07-01 ENCOUNTER — Encounter: Payer: Self-pay | Admitting: Podiatry

## 2019-07-01 ENCOUNTER — Other Ambulatory Visit: Payer: Self-pay | Admitting: Podiatry

## 2019-07-01 MED ORDER — OXYCODONE-ACETAMINOPHEN 10-325 MG PO TABS: 1.0000 | ORAL_TABLET | Freq: Three times a day (TID) | ORAL | 0 refills | Status: AC | PRN

## 2019-07-01 NOTE — Progress Notes (Signed)
Subjective:  Patient ID: Sophia Kelley, female    DOB: 07-Mar-1951,  MRN: [id number] HPI Chief Complaint  Patient presents with   Foot Injury    Plantar forefoot right - husband dropped light and broke it x 1 week ago, thinks may have galss in foot, ER dug out twice, so sore and bleeds, keeps covered with bandaid, still taking cephalexin 500mg  TID rx'd by ER   New Patient (Initial Visit)    68 y.o. female presents with the above complaint.   ROS: Denies fever chills nausea vomiting muscle aches pains calf pain back pain chest pain shortness of breath.  Past Medical History:  Diagnosis Date   Arthritis    right arm   Breast cancer of upper-outer quadrant of right female breast ([place]) 04/24/2016   Colon polyps    Complication of anesthesia    states was hard to wake up after 1 c-section   Constipation    Graves' disease    Radiation treatment   Immature cataract    Lumbar herniated disc    Nasal congestion 05/08/2016   Obesity    Shortness of breath dyspnea    with ADLs - no home O2   Sinus problem    Sleep apnea    had sleep study, did not go back for CPAP fitting   Tachycardia    states due to Graves' disease   Past Surgical History:  Procedure Laterality Date   ABDOMINAL HYSTERECTOMY  1990   complete   BREAST BIOPSY     BREAST LUMPECTOMY Right    2017   BREAST LUMPECTOMY WITH RADIOACTIVE SEED AND SENTINEL LYMPH NODE BIOPSY Right 05/15/2016   Procedure: RIGHT BREAST LUMPECTOMY WITH RADIOACTIVE SEED AND SENTINEL LYMPH NODE BIOPSY;  Surgeon: Autumn Messing III, MD;  Location: [facility];  Service: General;  Laterality: Right;   CARDIAC CATHETERIZATION N/A 11/01/2015   Procedure: Right/Left Heart Cath and Coronary Angiography;  Surgeon: Larey Dresser, MD;  Location: [facility];  Service: Cardiovascular;  Laterality: N/A;   CESAREAN SECTION  [id number]   x 2   COLONOSCOPY WITH PROPOFOL  06/12/2014   LAPAROSCOPIC APPENDECTOMY N/A  01/31/2014   Procedure: APPENDECTOMY LAPAROSCOPIC;  Surgeon: Harl Bowie, MD;  Location: [facility];  Service: General;  Laterality: N/A;   [place]  09/01/2007    Current Outpatient Medications:    amoxicillin-clavulanate (AUGMENTIN) 875-125 MG tablet, Take 1 tablet by mouth 2 (two) times daily., Disp: 20 tablet, Rfl: 0   aspirin 81 MG tablet, Take 81 mg by mouth daily., Disp: , Rfl:    Cholecalciferol (VITAMIN D3) 1000 units CAPS, Take by mouth., Disp: , Rfl:    cyanocobalamin (,VITAMIN B-12,) 1000 MCG/ML injection, Inject into the muscle., Disp: , Rfl:    levothyroxine (SYNTHROID, LEVOTHROID) 100 MCG tablet, Take 1 tablet by mouth daily., Disp: , Rfl:    linaclotide (LINZESS) 290 MCG CAPS capsule, Take 1 capsule (290 mcg total) by mouth daily before breakfast., Disp: 90 capsule, Rfl: 0   Multiple Vitamin (MULTIVITAMIN) tablet, Take 1 tablet by mouth daily., Disp: , Rfl:    mupirocin ointment (BACTROBAN) 2 %, Apply to wound after soaking BID, Disp: 30 g, Rfl: 1   nitroGLYCERIN (NITROSTAT) 0.4 MG SL tablet, Place under the tongue., Disp: , Rfl:    oxyCODONE-acetaminophen (PERCOCET) 10-325 MG tablet, Take 1 tablet by mouth every 4 (four) hours as needed for pain., Disp: 30 tablet, Rfl: 0  Allergies  Allergen Reactions   Codeine Rash   Review of Systems Objective:  There were no vitals filed for this visit.  General: Well developed, nourished, in no acute distress, alert and oriented x3   Dermatological: Skin is warm, dry and supple bilateral. Nails x 10 are well maintained; remaining integument appears unremarkable at this time. There are no open sores, no preulcerative lesions, no rash or signs of infection present.  Small ulcerative lesion forefoot right with surrounding cellulitis basically to the third interdigital space plantarly.  After local anesthesia was administered I probed to the area which had a tract that probes straight  down to the capsule of the third metatarsal phalangeal joint there is no purulence no malodor no sign of infection other than the surrounding sterile cellulitis and erythema I flushed the area with antibiotic solution.  Vascular: Dorsalis Pedis artery and Posterior Tibial artery pedal pulses are 2/4 bilateral with immedate capillary fill time. Pedal hair growth present. No varicosities and no lower extremity edema present bilateral.   Neruologic: Grossly intact via light touch bilateral. Vibratory intact via tuning fork bilateral. Protective threshold with Semmes Wienstein monofilament intact to all pedal sites bilateral. Patellar and Achilles deep tendon reflexes 2+ bilateral. No Babinski or clonus noted bilateral.   Musculoskeletal: No gross boney pedal deformities bilateral. No pain, crepitus, or limitation noted with foot and ankle range of motion bilateral. Muscular strength 5/5 in all groups tested bilateral.  Gait: Unassisted, Nonantalgic.    Radiographs:  Radiographs taken do not demonstrate any foreign body infection gas or air.  Assessment & Plan:   Assessment: Pain with infection associated with deep probing trying to extract a foreign body.  Plan: Cleaned and debrided the area today after local anesthesia was administered.  No foreign body was found.  She will dress the foot daily after soaking Epson salts and warm water Bactroban ointment and she will utilize a Darco shoe.  Also dispensed Augmentin 875 p.o. twice daily discontinue cephalexin.  I will follow-up with her in 1 to 2 weeks.  She will notify the emergency department of any significant health changes fever chills nausea vomiting cellulitic process.     Brittlyn Cloe T. [place], [place]

## 2019-07-01 NOTE — Telephone Encounter (Signed)
This should have been taken care of on Friday.  I sent it over on Saturday. Don't forget I'm in the OR on Friday and do not routinely check my epic afterwards.

## 2019-07-06 ENCOUNTER — Encounter: Payer: Self-pay | Admitting: Podiatry

## 2019-07-06 ENCOUNTER — Ambulatory Visit (INDEPENDENT_AMBULATORY_CARE_PROVIDER_SITE_OTHER): Payer: Medicare Other | Admitting: Podiatry

## 2019-07-06 ENCOUNTER — Other Ambulatory Visit: Payer: Self-pay

## 2019-07-06 VITALS — Temp 98.1°F

## 2019-07-06 DIAGNOSIS — L02619 Cutaneous abscess of unspecified foot: Secondary | ICD-10-CM

## 2019-07-06 DIAGNOSIS — L03119 Cellulitis of unspecified part of limb: Secondary | ICD-10-CM | POA: Diagnosis not present

## 2019-07-06 MED ORDER — AMOXICILLIN-POT CLAVULANATE 875-125 MG PO TABS: 1.0000 | ORAL_TABLET | Freq: Two times a day (BID) | ORAL | 0 refills | Status: DC

## 2019-07-06 NOTE — Progress Notes (Signed)
She presents today for follow-up of abscess with a foreign body that we reevaluated from the emergency department last week.  States that seems to be doing better until yesterday when it started to get really sore again.  States that she is just finishing up her Augmentin today.  She denies fever chills nausea vomiting muscle aches and pains.  Objective: Vital signs are stable she is alert and oriented x3 evaluation of the wound does not demonstrate any erythema cellulitis drainage odor just some mild reactive hyperkeratosis which I debrided once again today to bleeding.  I see no signs of a foreign body.  I did not probe very deep.  Assessment: Slowly healing open wound plantar aspect foreign body right foot.  Plan: Encouraged her to continue to soak and redressed this daily she will start a new prescription of Augmentin.  Follow-up with her in 2 weeks

## 2019-07-20 ENCOUNTER — Ambulatory Visit: Payer: PRIVATE HEALTH INSURANCE | Admitting: Podiatry

## 2019-08-15 ENCOUNTER — Encounter: Payer: Self-pay | Admitting: Podiatry

## 2019-08-15 ENCOUNTER — Ambulatory Visit (INDEPENDENT_AMBULATORY_CARE_PROVIDER_SITE_OTHER): Payer: Medicare Other | Admitting: Podiatry

## 2019-08-15 ENCOUNTER — Other Ambulatory Visit: Payer: Self-pay

## 2019-08-15 DIAGNOSIS — Q828 Other specified congenital malformations of skin: Secondary | ICD-10-CM

## 2019-08-16 ENCOUNTER — Encounter: Payer: Self-pay | Admitting: Podiatry

## 2019-08-16 NOTE — Progress Notes (Signed)
She presents today for follow-up of foreign body forefoot sub-met fourth right.  States that it feels like something is trying to come out of this.  Objective: Vital signs are stable she is alert and oriented x3.  Pulses are palpable.  I debrided an area of reactive hyperkeratosis over the lesion where we had previously found no foreign body other than the laceration.  Once debrided today is noted that the wound is healed and from deep to superficial with exception of one very small spot that had a more keratoma type lesion associated with that I debrided that and the patient felt much relief in her symptoms.  Assessment: Porokeratotic lesion sub-fourth met right.  Plan: Debrided the area today patient tolerated procedure well without complications we will follow-up with me should any other problems occur.

## 2019-08-24 ENCOUNTER — Ambulatory Visit: Payer: Medicare Other | Admitting: Podiatry

## 2019-09-14 ENCOUNTER — Emergency Department (HOSPITAL_COMMUNITY): Payer: Medicare Other

## 2019-09-14 ENCOUNTER — Emergency Department (HOSPITAL_COMMUNITY)
Admission: EM | Admit: 2019-09-14 | Discharge: 2019-09-15 | Disposition: A | Discharge status: Home / Self Care | Payer: Medicare Other | Attending: Emergency Medicine | Admitting: Emergency Medicine

## 2019-09-14 ENCOUNTER — Other Ambulatory Visit: Payer: Self-pay

## 2019-09-14 ENCOUNTER — Encounter (HOSPITAL_COMMUNITY): Payer: Self-pay

## 2019-09-14 DIAGNOSIS — R42 Dizziness and giddiness: Secondary | ICD-10-CM | POA: Insufficient documentation

## 2019-09-14 DIAGNOSIS — I10 Essential (primary) hypertension: Secondary | ICD-10-CM | POA: Diagnosis not present

## 2019-09-14 DIAGNOSIS — Z87891 Personal history of nicotine dependence: Secondary | ICD-10-CM | POA: Insufficient documentation

## 2019-09-14 DIAGNOSIS — J449 Chronic obstructive pulmonary disease, unspecified: Secondary | ICD-10-CM | POA: Diagnosis not present

## 2019-09-14 DIAGNOSIS — Y999 Unspecified external cause status: Secondary | ICD-10-CM | POA: Insufficient documentation

## 2019-09-14 DIAGNOSIS — Z7982 Long term (current) use of aspirin: Secondary | ICD-10-CM | POA: Insufficient documentation

## 2019-09-14 DIAGNOSIS — S8991XA Unspecified injury of right lower leg, initial encounter: Secondary | ICD-10-CM

## 2019-09-14 DIAGNOSIS — Z79899 Other long term (current) drug therapy: Secondary | ICD-10-CM | POA: Insufficient documentation

## 2019-09-14 DIAGNOSIS — Y929 Unspecified place or not applicable: Secondary | ICD-10-CM | POA: Diagnosis not present

## 2019-09-14 DIAGNOSIS — Z23 Encounter for immunization: Secondary | ICD-10-CM | POA: Diagnosis not present

## 2019-09-14 DIAGNOSIS — S81811A Laceration without foreign body, right lower leg, initial encounter: Secondary | ICD-10-CM | POA: Diagnosis not present

## 2019-09-14 DIAGNOSIS — Y939 Activity, unspecified: Secondary | ICD-10-CM | POA: Insufficient documentation

## 2019-09-14 DIAGNOSIS — W208XXA Other cause of strike by thrown, projected or falling object, initial encounter: Secondary | ICD-10-CM | POA: Diagnosis not present

## 2019-09-14 DIAGNOSIS — Z853 Personal history of malignant neoplasm of breast: Secondary | ICD-10-CM | POA: Diagnosis not present

## 2019-09-14 MED ORDER — LIDOCAINE-EPINEPHRINE (PF) 2 %-1:200000 IJ SOLN
20.0000 mL | Freq: Once | INTRAMUSCULAR | Status: DC
  Filled 2019-09-14: qty 20

## 2019-09-14 MED ORDER — CEFAZOLIN SODIUM-DEXTROSE 1-4 GM/50ML-% IV SOLN
1.0000 g | Freq: Once | INTRAVENOUS | Status: AC
  Administered 2019-09-14: 23:00:00 1 g via INTRAVENOUS
  Filled 2019-09-14: qty 50

## 2019-09-14 MED ORDER — FENTANYL CITRATE (PF) 100 MCG/2ML IJ SOLN
100.0000 ug | Freq: Once | INTRAMUSCULAR | Status: AC
  Administered 2019-09-15: 01:00:00 100 ug via INTRAVENOUS
  Filled 2019-09-14 (×2): qty 2

## 2019-09-14 MED ORDER — OXYCODONE-ACETAMINOPHEN 5-325 MG PO TABS
1.0000 | ORAL_TABLET | ORAL | Status: AC | PRN
  Administered 2019-09-14 (×2): 1 via ORAL
  Filled 2019-09-14 (×2): qty 1

## 2019-09-14 MED ORDER — TETANUS-DIPHTH-ACELL PERTUSSIS 5-2.5-18.5 LF-MCG/0.5 IM SUSP
0.5000 mL | Freq: Once | INTRAMUSCULAR | Status: AC
  Administered 2019-09-14: 0.5 mL via INTRAMUSCULAR
  Filled 2019-09-14: qty 0.5

## 2019-09-14 NOTE — ED Notes (Signed)
Patient now c/o feeling sweaty and nauseated. Significant drop in BP noted upon reassessment of vitals. Pt pale and diaphoretic. resp e/u, pt a/ox4.

## 2019-09-14 NOTE — ED Notes (Signed)
Denies and nausea at this time, left leg lac wound received with dressing. Pain rated 8 out of 10, PRN pain medication given.

## 2019-09-14 NOTE — ED Triage Notes (Signed)
Pt bib ems for laceration (6-7 cm long, 3-4 cm deep) to right lower leg from a car door. Bleeding controlled, no blood thinners. Pt a.o, denies numbness, +ROM.

## 2019-09-14 NOTE — ED Provider Notes (Signed)
[facility] EMERGENCY DEPARTMENT Provider Note   CSN: [id number] Arrival date & time: 09/14/19  1731     History   Chief Complaint Chief Complaint  Patient presents with  . Extremity Laceration    HPI ANAIKA Kelley is a 68 y.o. female with a history of lumbar herniated disc, Graves' disease, atherosclerosis, morbid obesity, right upper outer quadrant breast cancer, and OSA who presents to the emergency department by EMS with a chief complaint of right lower leg injury.   The patient reports sustained a wound to her right shin at approximately 1500 when the wheelchair ramp on their vehicle lowered down onto her leg.  She is a large laceration on her right shin.  The wound was dressed by the fire department, but she has not attempted to stand or walk since the injury she was advised not to put any weight or pressure on the leg.  She endorses severe, constant, sharp, burning pain in the vicinity of the wound.  She denies numbness, weakness, nausea, vomiting, right knee or ankle pain.  She reports that she has become dizzy and lightheaded several times over the last few hours when she attempted to bend over.  No syncope.  She is unsure when her Tdap was last updated.  She takes an 81 mg aspirin, but is otherwise not anticoagulated.     The history is provided by the patient. No language interpreter was used.    Past Medical History:  Diagnosis Date  . Arthritis    right arm  . Breast cancer of upper-outer quadrant of right female breast ([facility]) 04/24/2016  . Colon polyps   . Complication of anesthesia    states was hard to wake up after 1 c-section  . Constipation   . Graves' disease    Radiation treatment  . Immature cataract   . Lumbar herniated disc   . Nasal congestion 05/08/2016  . Obesity   . Shortness of breath dyspnea    with ADLs - no home O2  . Sinus problem   . Sleep apnea    had sleep study, did not go back for CPAP fitting  . Tachycardia    states due to [facility]' disease    Patient Active Problem List   Diagnosis Date Noted  . Essential hypertension 05/29/2019  . Current mild episode of major depressive disorder without prior episode ([place]) 02/21/2019  . Venous insufficiency 12/28/2018  . Hemarthrosis of right knee 10/10/2018  . Morbid obesity with BMI of 50.0-59.9, adult ([place]) 10/10/2018  . Effusion of left knee 10/06/2018  . Prediabetes 07/07/2018  . Leg edema 06/07/2018  . Skin infection 05/05/2018  . Left thigh pain 05/03/2018  . Lumbar degenerative disc disease 04/11/2018  . Lumbar spondylosis 04/11/2018  . Primary osteoarthritis of left hip 04/11/2018  . Atherosclerotic vascular disease 01/03/2018  . Palpitations 12/20/2017  . Shortness of breath 12/20/2017  . Subclinical hypothyroidism 12/16/2017  . Calculus of gallbladder without cholecystitis without obstruction 11/09/2017  . H/O Graves' disease 08/17/2017  . Postablative hypothyroidism 01/04/2017  . Breast cancer of upper-outer quadrant of right female breast ([facility]) 04/24/2016  . Sinus tachycardia 03/15/2016  . Hx of Hashimoto thyroiditis 01/03/2016  . COPD with exacerbation ([place]) 10/09/2015  . Occipital neuralgia of right side 05/15/2015  . Chronic constipation 02/01/2014  . Acute appendicitis 01/31/2014  . Osteoporosis 05/20/2013  . Other screening mammogram 07/23/2011  . Vitamin B12 deficiency 07/23/2011  . Chest pain, atypical 07/23/2011  . Obstructive sleep  apnea 02/20/2011  . ALLERGIC RHINITIS 02/03/2011  . LOW BACK PAIN, CHRONIC 01/02/2011  . Morbid obesity ([place]) 11/20/2010  . Hyperlipidemia 10/29/2009  . CONSTIPATION, CHRONIC 10/29/2009  . IRRITABLE BOWEL SYNDROME 10/29/2009  . Esophageal reflux 08/30/2009    Past Surgical History:  Procedure Laterality Date  . ABDOMINAL HYSTERECTOMY  1990   complete  . BREAST BIOPSY    . BREAST LUMPECTOMY Right    2017  . BREAST LUMPECTOMY WITH RADIOACTIVE SEED AND SENTINEL LYMPH NODE BIOPSY Right  05/15/2016   Procedure: RIGHT BREAST LUMPECTOMY WITH RADIOACTIVE SEED AND SENTINEL LYMPH NODE BIOPSY;  Surgeon: Autumn Messing III, MD;  Location: [facility];  Service: General;  Laterality: Right;  . CARDIAC CATHETERIZATION N/A 11/01/2015   Procedure: Right/Left Heart Cath and Coronary Angiography;  Surgeon: Larey Dresser, MD;  Location: [facility];  Service: Cardiovascular;  Laterality: N/A;  . CESAREAN SECTION  [id number]   x 2  . COLONOSCOPY WITH PROPOFOL  06/12/2014  . LAPAROSCOPIC APPENDECTOMY N/A 01/31/2014   Procedure: APPENDECTOMY LAPAROSCOPIC;  Surgeon: Harl Bowie, MD;  Location: [facility];  Service: General;  Laterality: N/A;  . NASAL SEPTUM SURGERY  1984  . RECTOCELE REPAIR  09/01/2007     OB History   No obstetric history on file.      Home Medications    Prior to Admission medications   Medication Sig Start Date End Date Taking? Authorizing Provider  aspirin 81 MG tablet Take 81 mg by mouth daily.   Yes [provider]  Cholecalciferol (VITAMIN D3) 1000 units CAPS Take by mouth.   Yes [provider]  cyanocobalamin (,VITAMIN B-12,) 1000 MCG/ML injection Inject 200 mcg into the muscle every 30 (thirty) days.  03/04/18  Yes [provider]  levothyroxine (SYNTHROID, LEVOTHROID) 100 MCG tablet Take 1 tablet by mouth daily. 12/30/17  Yes [provider]  linaclotide Rolan Lipa) 290 MCG CAPS capsule Take 1 capsule (290 mcg total) by mouth daily before breakfast. 01/05/19  Yes Armbruster, Carlota Raspberry, MD  Multiple Vitamin (MULTIVITAMIN) tablet Take 1 tablet by mouth daily.   Yes [provider]  nitroGLYCERIN (NITROSTAT) 0.4 MG SL tablet Place 0.4 mg under the tongue every 5 (five) minutes as needed for chest pain.  10/17/15  Yes [provider]  doxycycline (VIBRAMYCIN) 100 MG capsule Take 1 capsule (100 mg total) by mouth 2 (two) times daily for 7 days. 09/15/19 09/22/19  McDonald, Mia A, PA-C   oxyCODONE-acetaminophen (PERCOCET/ROXICET) 5-325 MG tablet Take 1 tablet by mouth every 8 (eight) hours as needed for severe pain. 09/15/19   McDonald, Mia A, PA-C    Family History Family History  Problem Relation Age of Onset  . Heart attack Father 48       x3  . Allergies Father   . Arthritis Father   . Heart disease Father   . Prostate cancer Brother   . Thyroid disease Mother   . Hypertension Mother   . Arthritis Mother   . Osteoporosis Mother     Social History Social History   Tobacco Use  . Smoking status: Former Smoker    Years: 40.00    Types: Cigarettes    Quit date: 05/14/2016    Years since quitting: 3.3  . Smokeless tobacco: Never Used  . Tobacco comment: 5-6 cig./day  Substance Use Topics  . Alcohol use: No    Alcohol/week: 0.0 standard drinks  . Drug use: No     Allergies  Codeine   Review of Systems Review of Systems  Constitutional: Negative for activity change, chills and fever.  Respiratory: Negative for shortness of breath and wheezing.   Cardiovascular: Negative for chest pain.  Gastrointestinal: Negative for abdominal pain, diarrhea, nausea and vomiting.  Genitourinary: Negative for dysuria.  Musculoskeletal: Positive for arthralgias and myalgias. Negative for back pain and neck stiffness.  Skin: Positive for wound. Negative for rash.  Allergic/Immunologic: Negative for immunocompromised state.  Neurological: Positive for dizziness and light-headedness. Negative for seizures, syncope, weakness and headaches.  Psychiatric/Behavioral: Negative for confusion.   Physical Exam Updated Vital Signs BP 138/87   Pulse 70   Temp 98 F (36.7 C) (Oral)   Resp 13   LMP  (LMP Unknown)   SpO2 95%   Physical Exam Vitals signs and nursing note reviewed.  Constitutional:      General: She is not in acute distress.    Appearance: She is obese.  HENT:     Head: Normocephalic.  Eyes:     Conjunctiva/sclera: Conjunctivae normal.  Neck:      Musculoskeletal: Neck supple.  Cardiovascular:     Rate and Rhythm: Normal rate and regular rhythm.     Heart sounds: No murmur. No friction rub. No gallop.   Pulmonary:     Effort: Pulmonary effort is normal. No respiratory distress.  Abdominal:     General: There is no distension.     Palpations: Abdomen is soft.  Skin:    General: Skin is warm.     Findings: No rash.     Comments: There is a 7 cm V-shaped laceration with a flap with a depth of approximately 1 cm noted to the anterior aspect of the right lower leg.  Wound is hemostatic when pressure is applied, but when pressure is removed there is a small area of oozing noted from the center of wound, near the mid-line of the lower leg.  Musculature of the lower leg is intact.  DP pulses are 2+ and symmetric.  Sensation is intact and equal to the bilateral lower extremities.  Independently moves all digits of the right foot.  5-5 strength against resistance with dorsiflexion plantarflexion.  No tenderness to the right knee or ankle and there is full active passive range of motion of both joints.  She has significantly tender to palpation in the area surrounding the wound.  No obvious foreign bodies.  Neurological:     Mental Status: She is alert.  Psychiatric:        Behavior: Behavior normal.          ED Treatments / Results  Labs (all labs ordered are listed, but only abnormal results are displayed) Labs Reviewed - No data to display  EKG None  Radiology Dg Tibia/fibula Right  Result Date: 09/14/2019 CLINICAL DATA:  Right lower leg pain after injury. Struck leg on husband's handicap ramp. EXAM: RIGHT TIBIA AND FIBULA - 2 VIEW COMPARISON:  None. FINDINGS: Cortical margins of the tibia and fibular intact. There is no evidence of fracture or other focal bone lesions. Mild generalized soft tissue edema. No radiopaque foreign body. A dressing overlies the anterior lower leg. IMPRESSION: 1. No fracture of the tibia or fibula. 2.  Mild generalized soft tissue edema. Electronically Signed   By: Keith Rake M.D.   On: 09/14/2019 23:00    Procedures .Marland KitchenLaceration Repair  Date/Time: 09/15/2019 1:36 AM Performed by: Joanne Gavel, PA-C Authorized by: Joanne Gavel, PA-C   Consent:  Consent obtained:  Verbal   Consent given by:  Patient   Risks discussed:  Poor cosmetic result, poor wound healing, infection, pain and retained foreign body   Alternatives discussed:  No treatment Anesthesia (see MAR for exact dosages):    Anesthesia method:  Local infiltration   Local anesthetic:  Lidocaine 2% WITH epi Laceration details:    Location:  Leg   Leg location:  R lower leg   Length (cm):  7 Repair type:    Repair type:  Intermediate Pre-procedure details:    Preparation:  Patient was prepped and draped in usual sterile fashion and imaging obtained to evaluate for foreign bodies Exploration:    Hemostasis achieved with:  Direct pressure and epinephrine   Wound exploration: wound explored through full range of motion and entire depth of wound probed and visualized     Wound extent: fascia violated     Wound extent: no foreign bodies/material noted, no muscle damage noted, no nerve damage noted, no tendon damage noted, no underlying fracture noted and no vascular damage noted     Contaminated: no   Treatment:    Area cleansed with:  Saline   Amount of cleaning:  Extensive   Irrigation method:  Pressure wash   Visualized foreign bodies/material removed: no   Skin repair:    Repair method:  Staples   Number of staples:  17 Approximation:    Approximation:  Close Post-procedure details:    Dressing:  Non-adherent dressing and adhesive bandage   Patient tolerance of procedure:  Tolerated well, no immediate complications   (including critical care time)  Medications Ordered in ED Medications  lidocaine-EPINEPHrine (XYLOCAINE W/EPI) 2 %-1:200000 (PF) injection 20 mL (has no administration in time range)   oxyCODONE-acetaminophen (PERCOCET/ROXICET) 5-325 MG per tablet 1 tablet (1 tablet Oral Given 09/14/19 2059)  fentaNYL (SUBLIMAZE) injection 100 mcg (100 mcg Intravenous Given 09/15/19 0031)  ceFAZolin (ANCEF) IVPB 1 g/50 mL premix (0 g Intravenous Stopped 09/14/19 2339)  Tdap (BOOSTRIX) injection 0.5 mL (0.5 mLs Intramuscular Given 09/14/19 2341)  ondansetron (ZOFRAN) injection 4 mg (4 mg Intravenous Given 09/15/19 0031)     Initial Impression / Assessment and Plan / ED Course  I have reviewed the triage vital signs and the nursing notes.  Pertinent labs & imaging results that were available during my care of the patient were reviewed by me and considered in my medical decision making (see chart for details).        68 year old female with a history of lumbar herniated disc, Graves' disease, atherosclerosis, morbid obesity, right upper outer quadrant breast cancer, and OSA who presents to the emergency department with a large laceration to the anterior aspect of the right lower leg that occurred earlier this afternoon when the wheelchair ramp on her home vehicle lowered onto the leg.  She had a near vasovagal episode in the ER waiting room, but has not syncopized.  Pressures have been stable since she has been roomed in the ER.  She was given Zofran for nausea and feels markedly improved.  Pain well controlled with fentanyl.  Patient was seen and independently evaluated by Dr. Wyvonnia Dusky, attending physician, who is in agreement with work-up and plan.  X-ray of the right lower leg is negative for underlying fracture. Tdap updated. Pressure irrigation performed. Wound explored and base of wound visualized in a bloodless field without evidence of foreign body.  Laceration occurred < 10 hours prior to repair which was well tolerated.  Since the wound  is on the anterior aspect of the lower leg, staples were placed.  Wound was well approximated with everted skin edges.  Discussed staple home care with patient  and answered questions.  Will discharge on doxycycline for infection prophylaxis.  She was given a dose of Ancef in the ER.  Pt to follow-up for wound check and staple removal in 7-10 days; they are to return to the ED sooner for signs of infection.  Will send home with a short course of Percocet.   A 18-month prescription history query was performed using the [facility] CSRS prior to discharge.  Ambulated independently without difficulty prior to discharge.  ER return precautions given.  She is hemodynamically stable in no acute distress.  Safe for discharge home with outpatient follow-up.   Final Clinical Impressions(s) / ED Diagnoses   Final diagnoses:  Injury of right lower leg, initial encounter  Laceration of right lower leg, initial encounter    ED Discharge Orders         Ordered    oxyCODONE-acetaminophen (PERCOCET/ROXICET) 5-325 MG tablet  Every 8 hours PRN     09/15/19 0131    doxycycline (VIBRAMYCIN) 100 MG capsule  2 times daily     09/15/19 0131           McDonald, Mia A, PA-C 09/15/19 0206    Ezequiel Essex, MD 09/15/19 [phone]

## 2019-09-15 DIAGNOSIS — S81811A Laceration without foreign body, right lower leg, initial encounter: Secondary | ICD-10-CM | POA: Diagnosis not present

## 2019-09-15 MED ORDER — DOXYCYCLINE HYCLATE 100 MG PO CAPS: 100.0000 mg | ORAL_CAPSULE | Freq: Two times a day (BID) | ORAL | 0 refills | Status: AC

## 2019-09-15 MED ORDER — OXYCODONE-ACETAMINOPHEN 5-325 MG PO TABS: 1.0000 | ORAL_TABLET | Freq: Three times a day (TID) | ORAL | 0 refills | Status: DC | PRN

## 2019-09-15 MED ORDER — ONDANSETRON HCL 4 MG/2ML IJ SOLN
4.0000 mg | Freq: Once | INTRAMUSCULAR | Status: AC
  Administered 2019-09-15: 01:00:00 4 mg via INTRAVENOUS
  Filled 2019-09-15: qty 2

## 2019-09-15 NOTE — ED Notes (Signed)
Call [phone] when ready to discharge

## 2019-09-15 NOTE — ED Notes (Signed)
Patient ambulated well on her own.

## 2019-09-15 NOTE — Discharge Instructions (Addendum)
Thank you for allowing me to care for you today in the Emergency Department.   Your staples need to be removed in 7 to 10 days.  You can have these removed at your primary care provider's office, go to urgent care, or return to the ER.  To care for your wound at home, gently clean the area at least once daily with warm water and soap.  Apply a topical antibiotic such as bacitracin or Neosporin to the skin.  Then, apply a nonstick dressing to the wound.  Please keep it covered until the staples are removed.  Change the dressing at least once per day, but more often if it gets soiled.  Take 1 tablet of doxycycline 2 times daily for the next week.  Doxycycline is an antibiotic to prevent infection.  For pain control, elevate your right leg.  You can apply ice packs for 15 to 20 minutes as frequently as needed.  Take 650 mg of Tylenol once every 6 hours.  For severe, uncontrollable pain, you can take 1 tablet of Percocet every 8 hours.  Do not work or drive while taking this medication because it can cause you to be impaired.  You should have the wound evaluated if you develop fevers, chills, if the wound gets red, hot to the touch, or starts to have thick, mucus-like drainage, red streaking from the wound.

## 2019-10-18 ENCOUNTER — Other Ambulatory Visit: Payer: Self-pay | Admitting: Gastroenterology

## 2019-10-18 ENCOUNTER — Telehealth: Payer: Self-pay | Admitting: Gastroenterology

## 2019-10-18 NOTE — Telephone Encounter (Signed)
Pt requested a refill for Linzess sent to [facility] on [address] in [place].

## 2019-10-19 ENCOUNTER — Other Ambulatory Visit: Payer: Self-pay

## 2019-10-19 MED ORDER — LINACLOTIDE 290 MCG PO CAPS: 290.0000 ug | ORAL_CAPSULE | Freq: Every day | ORAL | 1 refills | Status: DC

## 2019-10-19 NOTE — Telephone Encounter (Signed)
Called pt and LM to schedule an office visit at her earliest convenience.  Last seen 2018.  Refilled Linzess for 30 days with 1 refill. No more refills without an office visit scheduled.

## 2019-10-19 NOTE — Progress Notes (Signed)
Refilled Linzess for 30 days with 1 refill.  LM on pt voicemail to please call and schedule an appt for further refills. Last seen 12-2016.

## 2019-11-28 ENCOUNTER — Telehealth: Payer: Medicare Other | Admitting: Cardiology

## 2020-01-24 ENCOUNTER — Ambulatory Visit: Payer: Medicare Other | Admitting: Cardiology

## 2020-03-04 ENCOUNTER — Telehealth: Payer: Self-pay | Admitting: Gastroenterology

## 2020-03-04 NOTE — Telephone Encounter (Signed)
Called patient back and scheduled an office visit with Dr. Havery Moros on 04/11/20. Let her know since we have not seen her since 01/12/2017, she would need an office visit before scheduling a Colonoscopy

## 2020-03-21 ENCOUNTER — Ambulatory Visit: Payer: Medicare Other | Admitting: Cardiology

## 2020-04-11 ENCOUNTER — Ambulatory Visit: Payer: Medicare Other | Admitting: Gastroenterology

## 2020-04-29 ENCOUNTER — Other Ambulatory Visit: Payer: Self-pay | Admitting: General Surgery

## 2020-04-29 DIAGNOSIS — Z1231 Encounter for screening mammogram for malignant neoplasm of breast: Secondary | ICD-10-CM

## 2020-05-30 ENCOUNTER — Other Ambulatory Visit: Payer: Self-pay | Admitting: General Surgery

## 2020-05-30 DIAGNOSIS — Z853 Personal history of malignant neoplasm of breast: Secondary | ICD-10-CM

## 2022-09-21 ENCOUNTER — Other Ambulatory Visit: Payer: Self-pay | Admitting: General Surgery

## 2022-09-21 DIAGNOSIS — Z9889 Other specified postprocedural states: Secondary | ICD-10-CM

## 2022-10-29 ENCOUNTER — Ambulatory Visit
Admission: RE | Admit: 2022-10-29 | Discharge: 2022-10-29 | Disposition: A | Discharge status: Home / Self Care | Payer: Medicare Other | Source: Ambulatory Visit | Attending: General Surgery | Admitting: General Surgery

## 2022-10-29 DIAGNOSIS — Z9889 Other specified postprocedural states: Secondary | ICD-10-CM

## 2023-02-11 ENCOUNTER — Encounter (HOSPITAL_BASED_OUTPATIENT_CLINIC_OR_DEPARTMENT_OTHER): Payer: Self-pay

## 2023-02-11 ENCOUNTER — Other Ambulatory Visit: Payer: Self-pay

## 2023-02-11 ENCOUNTER — Emergency Department (HOSPITAL_BASED_OUTPATIENT_CLINIC_OR_DEPARTMENT_OTHER): Payer: Medicare Other

## 2023-02-11 ENCOUNTER — Emergency Department (HOSPITAL_BASED_OUTPATIENT_CLINIC_OR_DEPARTMENT_OTHER)
Admission: EM | Admit: 2023-02-11 | Discharge: 2023-02-11 | Disposition: A | Discharge status: Home / Self Care | Payer: Medicare Other | Attending: Emergency Medicine | Admitting: Emergency Medicine

## 2023-02-11 DIAGNOSIS — K5641 Fecal impaction: Secondary | ICD-10-CM | POA: Insufficient documentation

## 2023-02-11 DIAGNOSIS — Z7982 Long term (current) use of aspirin: Secondary | ICD-10-CM | POA: Insufficient documentation

## 2023-02-11 DIAGNOSIS — K6289 Other specified diseases of anus and rectum: Secondary | ICD-10-CM | POA: Diagnosis present

## 2023-02-11 MED ORDER — FLEET ENEMA 7-19 GM/118ML RE ENEM
1.0000 | ENEMA | Freq: Once | RECTAL | Status: AC
  Administered 2023-02-11: 1 via RECTAL
  Filled 2023-02-11: qty 1

## 2023-02-11 MED ORDER — MINERAL OIL RE ENEM: 1.0000 | ENEMA | Freq: Once | RECTAL | Status: DC

## 2023-02-11 MED ORDER — GOLYTELY 236 G PO SOLR: 4000.0000 mL | Freq: Once | ORAL | 0 refills | Status: AC

## 2023-02-11 NOTE — ED Provider Notes (Signed)
Accepted handoff at shift change from [facility]. Please see prior provider note for more detail.   Briefly: Patient is 72 y.o. with chronic constipation who has not had a bowel movement in 4 days.  DDX: concern for constipation, bowel obstruction  Plan: Follow-up KUB to evaluate for obstruction.  If unremarkable plan to send home with GoLytely and follow-up with PCP.      RISR  EDTHIS  Physical Exam  BP 133/78 (BP Location: Left Arm)   Pulse 98   Temp 97.9 F (36.6 C) (Oral)   Resp 16   LMP  (LMP Unknown)   SpO2 93%   Physical Exam  Procedures  Procedures  ED Course / MDM    Medical Decision Making Amount and/or Complexity of Data Reviewed Radiology: ordered.  Risk OTC drugs.   No evidence of obstruction on KUB.  Patient also stated that she felt much better after disimpaction and enema.  Moderate stool burden was relieved.  Sent GoLytely to her pharmacy.  Advised her to follow-up with her PCP.       Harriet Pho, PA-C 02/11/23 1932    Hayden Rasmussen, MD 02/12/23 [phone]

## 2023-02-11 NOTE — ED Notes (Signed)
Discharge paperwork reviewed entirely with patient, including Rx's and follow up care. Pain was under control. Pt verbalized understanding as well as all parties involved. No questions or concerns voiced at the time of discharge. No acute distress noted.   Pt ambulated out to PVA without incident or assistance.  

## 2023-02-11 NOTE — Discharge Instructions (Signed)
Evaluation today was overall reassuring.  Symptoms are likely due to constipation.  I have sent GoLytely to your pharmacy.  Recommend that you follow-up with your PCP for your ongoing constipation.  If you have worsening abdominal pain, blood in your stool, urine or vomit or any other concerning symptom please return emergency department for further evaluation.

## 2023-02-11 NOTE — ED Provider Notes (Signed)
[facility] Provider Note   CSN: [id number] Arrival date & time: 02/11/23  1643     History  Chief Complaint  Sophia Kelley presents with   Constipation    Sophia Kelley is a 72 y.o. female, history of chronic constipation, who presents to the ED after not having bowel movement for the last 4 days.  Sophia Kelley states that Sophia Kelley has a lot of rectal pain, and has tried multiple laxatives including MiraLAX, Dulcolax, mag citrate without any relief.  States Sophia Kelley just had a colonoscopy and said it was unremarkable.  Denies any abdominal pain, nausea, vomiting.  Has been unable to pass gas as Sophia Kelley feels like there is a ton of rectal pain.     Home Medications Prior to Admission medications   Medication Sig Start Date End Date Taking? Authorizing Provider  aspirin 81 MG tablet Take 81 mg by mouth daily.    [provider]  Cholecalciferol (VITAMIN D3) 1000 units CAPS Take by mouth.    [provider]  cyanocobalamin (,VITAMIN B-12,) 1000 MCG/ML injection Inject 200 mcg into the muscle every 30 (thirty) days.  03/04/18   [provider]  levothyroxine (SYNTHROID, LEVOTHROID) 100 MCG tablet Take 1 tablet by mouth daily. 12/30/17   [provider]  linaclotide (LINZESS) 290 MCG CAPS capsule Take 1 capsule (290 mcg total) by mouth daily before breakfast. Please schedule an office visit for further refills.  Last seen 12-2016. Thank you: [phone] 10/19/19   Yetta Flock, MD  Multiple Vitamin (MULTIVITAMIN) tablet Take 1 tablet by mouth daily.    [provider]  nitroGLYCERIN (NITROSTAT) 0.4 MG SL tablet Place 0.4 mg under the tongue every 5 (five) minutes as needed for chest pain.  10/17/15   [provider]  oxyCODONE-acetaminophen (PERCOCET/ROXICET) 5-325 MG tablet Take 1 tablet by mouth every 8 (eight) hours as needed for severe pain. 09/15/19   McDonald, Mia A, PA-C      Allergies    Codeine     Review of Systems   Review of Systems  Gastrointestinal:  Positive for constipation. Negative for abdominal pain.    Physical Exam Updated Vital Signs BP 133/78 (BP Location: Left Arm)   Pulse 98   Temp 97.9 F (36.6 C) (Oral)   Resp 16   LMP  (LMP Unknown)   SpO2 93%  Physical Exam Vitals and nursing note reviewed.  Constitutional:      General: Sophia Kelley is not in acute distress.    Appearance: Sophia Kelley is well-developed.  HENT:     Head: Normocephalic and atraumatic.  Eyes:     Conjunctiva/sclera: Conjunctivae normal.  Cardiovascular:     Rate and Rhythm: Normal rate and regular rhythm.     Heart sounds: No murmur heard. Pulmonary:     Effort: Pulmonary effort is normal. No respiratory distress.     Breath sounds: Normal breath sounds.  Abdominal:     Palpations: Abdomen is soft.     Tenderness: There is no abdominal tenderness.  Genitourinary:    Comments: Chaperone present, rectum full of stool Musculoskeletal:        General: No swelling.     Cervical back: Neck supple.  Skin:    General: Skin is warm and dry.     Capillary Refill: Capillary refill takes less than 2 seconds.  Neurological:     Mental Status: Sophia Kelley is alert.  Psychiatric:        Mood and Affect:  Mood normal.     ED Results / Procedures / Treatments   Labs (all labs ordered are listed, but only abnormal results are displayed) Labs Reviewed - No data to display  EKG None  Radiology No results found.  Procedures Fecal disimpaction  Date/Time: 02/11/2023 6:56 PM  Performed by: Osvaldo Shipper, PA Authorized by: Osvaldo Shipper, PA  Consent: Verbal consent obtained. Risks and benefits: risks, benefits and alternatives were discussed Consent given by: Sophia Kelley Sophia Kelley understanding: Sophia Kelley states understanding of the procedure being performed Sophia Kelley consent: the Sophia Kelley's understanding of the procedure matches consent given Sophia Kelley identity confirmed: verbally with Sophia Kelley Local anesthesia  used: no  Anesthesia: Local anesthesia used: no  Sedation: Sophia Kelley sedated: no  Comments: Only able to disimpact a Damone Fancher amount initially, Sophia Kelley given enema, w/repeat attempt at disimpaction with great success. Large volume of stool disimpacted.       Medications Ordered in ED Medications  sodium phosphate (FLEET) 7-19 GM/118ML enema 1 enema (1 enema Rectal Given 02/11/23 1755)    ED Course/ Medical Decision Making/ A&P                             Medical Decision Making Sophia Kelley is a 72 year old female, urinalysis and a bowel movement last 4 days, reports rectal pain.  No nausea, vomiting, abdominal pain.  On exam with Gerald Stabs EMT, chaperoning, large amount of stool in the rectal vault, discussed with Sophia Kelley need for disimpacted, Sophia Kelley voiced understanding, see procedure below.  Minimal amount of stool extracted from first initial disimpaction, given an enema, and repeat disimpaction, with great relief of symptoms.  We obtained a KUB to evaluate for possible obstruction, given that Sophia Kelley is fairly protuberant, and unable to have bowel movement for the last 4 days.  I am suspicious that her symptoms were likely secondary to her impacted stool. John PA, given handoff, he will follow-up on xray and I recommend discharge with golytely if KUB unremarkable.   Amount and/or Complexity of Data Reviewed Radiology: ordered.  Risk OTC drugs.   Final Clinical Impression(s) / ED Diagnoses Final diagnoses:  Fecal impaction in rectum [facility])    Rx / DC Orders ED Discharge Orders     None         Osvaldo Shipper, PA 02/11/23 1904    Hayden Rasmussen, MD 02/12/23 [phone]

## 2023-02-11 NOTE — ED Notes (Signed)
Enema administered. Patient is now sitting on a bedside commode.

## 2023-02-11 NOTE — ED Notes (Signed)
Pt requesting to see provider d/t rectal pressure. Stronach notified

## 2023-02-11 NOTE — ED Triage Notes (Signed)
Pt arrives with c/o constipation. Per pt, she has not had a BM in 4 days. Pt endorses rectum pain. Pt denies ABD pain or n/v.

## 2023-06-22 ENCOUNTER — Other Ambulatory Visit: Payer: Self-pay | Admitting: General Surgery

## 2023-06-22 DIAGNOSIS — N644 Mastodynia: Secondary | ICD-10-CM

## 2023-07-13 ENCOUNTER — Other Ambulatory Visit: Payer: Medicare Other

## 2023-07-20 ENCOUNTER — Other Ambulatory Visit: Payer: Medicare Other

## 2023-07-23 ENCOUNTER — Other Ambulatory Visit: Payer: Self-pay

## 2023-07-28 ENCOUNTER — Other Ambulatory Visit: Payer: Medicare Other

## 2023-08-02 ENCOUNTER — Telehealth: Payer: Self-pay

## 2023-08-02 NOTE — Telephone Encounter (Signed)
Auth Submission: NO AUTH NEEDED Site of care: Site of care: [facility] Payer: Medicare A/B & USAA Supplement Medication & CPT/J Code(s) submitted: Prolia (Denosumab) [id number]  Auth type: Buy/Bill HB Units/visits requested: 60mg  q 6 months  Approval from: 08/02/23 to 12/02/23

## 2023-08-10 ENCOUNTER — Ambulatory Visit
Admission: RE | Admit: 2023-08-10 | Discharge: 2023-08-10 | Disposition: A | Discharge status: Home / Self Care | Payer: Medicare Other | Source: Ambulatory Visit | Attending: General Surgery | Admitting: General Surgery

## 2023-08-10 ENCOUNTER — Ambulatory Visit: Admission: RE | Admit: 2023-08-10 | Payer: Medicare Other | Source: Ambulatory Visit

## 2023-08-10 DIAGNOSIS — N644 Mastodynia: Secondary | ICD-10-CM

## 2023-08-19 ENCOUNTER — Other Ambulatory Visit: Payer: Self-pay | Admitting: General Surgery

## 2023-08-19 DIAGNOSIS — Z1231 Encounter for screening mammogram for malignant neoplasm of breast: Secondary | ICD-10-CM

## 2023-09-21 ENCOUNTER — Telehealth: Payer: Self-pay | Admitting: Gastroenterology

## 2023-09-21 NOTE — Telephone Encounter (Signed)
Good afternoon Dr. Adela Lank,   We received a call from this patient wishing to be scheduled with you for a colonoscopy. Patient recently transferred her care to [facility] in April and January of 2024. Colonoscopy report from 12/2022 are in Epic for your review. She states she saw them for different symptoms but would still like to continue care with you. Would you please advise on scheduling?  Thank you.

## 2023-09-21 NOTE — Telephone Encounter (Signed)
Yes we can perform her colonoscopy at the [facility] if she meets criteria for it there. She will require a DOUBLE PREP given her last exam was limited by poor prep. Thanks

## 2023-09-24 ENCOUNTER — Encounter: Payer: Self-pay | Admitting: Gastroenterology

## 2023-09-24 NOTE — Telephone Encounter (Signed)
Left voicemail for patient to call back and schedule colonoscopy with 2 Day Prep.

## 2023-10-05 ENCOUNTER — Encounter: Payer: Self-pay | Admitting: Obstetrics and Gynecology

## 2023-10-14 ENCOUNTER — Telehealth: Payer: Self-pay

## 2023-10-21 ENCOUNTER — Encounter: Payer: Self-pay | Admitting: Podiatry

## 2023-10-21 ENCOUNTER — Ambulatory Visit (INDEPENDENT_AMBULATORY_CARE_PROVIDER_SITE_OTHER): Payer: Medicare Other | Admitting: Podiatry

## 2023-10-21 DIAGNOSIS — B351 Tinea unguium: Secondary | ICD-10-CM | POA: Diagnosis not present

## 2023-10-21 DIAGNOSIS — M79674 Pain in right toe(s): Secondary | ICD-10-CM | POA: Diagnosis not present

## 2023-10-21 DIAGNOSIS — M79675 Pain in left toe(s): Secondary | ICD-10-CM

## 2023-10-22 NOTE — Progress Notes (Signed)
Chief Complaint  Patient presents with   Nail Problem    Nail trim - both feet. Possible ingrown nails on big toes. Not diabetic. Unable to bend down due to spine issues which will require surgery at some point.  This causing circulation issues    HPI: 72 y.o. female presents today for treatment of painful, thickened, elongated, fungal toenails.  They are painful direct compression.  She is unable to maintain them herself due to the increased thickness and due to her back pain.  She states some of the nails are beginning to incurvated and are painful.  Past Medical History:  Diagnosis Date   Arthritis    right arm   Breast cancer of upper-outer quadrant of right female breast (HCC) 04/24/2016   Colon polyps    Complication of anesthesia    states was hard to wake up after 1 c-section   Constipation    Graves' disease    Radiation treatment   Immature cataract    Lumbar herniated disc    Nasal congestion 05/08/2016   Obesity    Shortness of breath dyspnea    with ADLs - no home O2   Sinus problem    Sleep apnea    had sleep study, did not go back for CPAP fitting   Tachycardia    states due to Graves' disease    Past Surgical History:  Procedure Laterality Date   ABDOMINAL HYSTERECTOMY  1990   complete   BREAST BIOPSY     BREAST LUMPECTOMY Right    2017   BREAST LUMPECTOMY WITH RADIOACTIVE SEED AND SENTINEL LYMPH NODE BIOPSY Right 05/15/2016   Procedure: RIGHT BREAST LUMPECTOMY WITH RADIOACTIVE SEED AND SENTINEL LYMPH NODE BIOPSY;  Surgeon: Chevis Pretty III, MD;  Location: [facility];  Service: General;  Laterality: Right;   CARDIAC CATHETERIZATION N/A 11/01/2015   Procedure: Right/Left Heart Cath and Coronary Angiography;  Surgeon: Laurey Morale, MD;  Location: [facility]IVE CV LAB;  Service: Cardiovascular;  Laterality: N/A;   CESAREAN SECTION  8119,1478   x 2   COLONOSCOPY WITH PROPOFOL  06/12/2014   LAPAROSCOPIC APPENDECTOMY N/A 01/31/2014    Procedure: APPENDECTOMY LAPAROSCOPIC;  Surgeon: Shelly Rubenstein, MD;  Location: [facility];  Service: General;  Laterality: N/A;   NASAL SEPTUM SURGERY  1984   RECTOCELE REPAIR  09/01/2007    Allergies  Allergen Reactions   Codeine Rash    ROS negative except as stated in HPI   Physical Exam: There were no vitals filed for this visit.  General: The patient is alert and oriented x3 in no acute distress.  Dermatology: Skin is warm, dry and supple bilateral lower extremities. Interspaces are clear of maceration and debris.  Nails 1 through 5 bilaterally are thickened, elongated, dystrophic, discolored with subungual debris.  Toenails 1-3 bilaterally are incurvated.  All toenails are painful with direct dorsal compression  Vascular: Palpable pedal pulses bilaterally. Capillary refill within normal limits.  No appreciable edema.  No erythema or calor.  Neurological: Light touch sensation grossly intact bilateral feet.   Musculoskeletal Exam: No pedal deformities noted    Assessment/Plan of Care: 1. Pain due to onychomycosis of toenails of both feet      No orders of the defined types were placed in this encounter.  None  Discussed clinical findings with patient today.  # Onychomycosis painful toenails -Toenails 1 through 5 are debrided in thickness and length using aseptic nail nippers and  burred down to reduce thickness. -No iatrogenic bleeding encountered. -Patient may follow-up in 3 months for routine care   Blaine Hari L. Marchia Bond, AACFAS [facility]     [address] [place], [place] [address]                Office [phone]  Fax [phone]

## 2023-10-25 ENCOUNTER — Other Ambulatory Visit: Payer: Self-pay

## 2023-10-25 ENCOUNTER — Ambulatory Visit (AMBULATORY_SURGERY_CENTER): Payer: Medicare Other

## 2023-10-25 VITALS — Ht 66.0 in | Wt 270.0 lb

## 2023-10-25 DIAGNOSIS — Z1211 Encounter for screening for malignant neoplasm of colon: Secondary | ICD-10-CM

## 2023-10-25 MED ORDER — PEG 3350-KCL-NA BICARB-NACL 420 G PO SOLR: 4000.0000 mL | Freq: Once | ORAL | 0 refills | Status: AC

## 2023-10-25 NOTE — Progress Notes (Signed)
Denies allergies to eggs or soy products. Denies complication of anesthesia or sedation. Denies use of weight loss medication. Denies use of O2.   Emmi instructions given for colonoscopy.  

## 2023-10-31 ENCOUNTER — Encounter: Payer: Self-pay | Admitting: Certified Registered Nurse Anesthetist

## 2023-11-01 ENCOUNTER — Ambulatory Visit: Payer: Medicare Other

## 2023-11-03 ENCOUNTER — Telehealth: Payer: Self-pay | Admitting: Gastroenterology

## 2023-11-03 NOTE — Telephone Encounter (Signed)
Spoke with patients all questions answered

## 2023-11-03 NOTE — Telephone Encounter (Signed)
LVM to call back.

## 2023-11-03 NOTE — Telephone Encounter (Signed)
Inbound call from patient, returning call.

## 2023-11-03 NOTE — Telephone Encounter (Signed)
Inbound call from patient requesting a call regarding prep medication. States she is needs more clarity regarding Miralax. Please advise, thank you.

## 2023-11-08 ENCOUNTER — Ambulatory Visit: Payer: Medicare Other | Admitting: Gastroenterology

## 2023-11-08 ENCOUNTER — Encounter: Payer: Self-pay | Admitting: Gastroenterology

## 2023-11-08 VITALS — BP 142/89 | HR 65 | Temp 98.0°F | Resp 14 | Ht 66.0 in | Wt 270.0 lb

## 2023-11-08 DIAGNOSIS — D120 Benign neoplasm of cecum: Secondary | ICD-10-CM

## 2023-11-08 DIAGNOSIS — K635 Polyp of colon: Secondary | ICD-10-CM

## 2023-11-08 DIAGNOSIS — D123 Benign neoplasm of transverse colon: Secondary | ICD-10-CM

## 2023-11-08 DIAGNOSIS — K5909 Other constipation: Secondary | ICD-10-CM

## 2023-11-08 DIAGNOSIS — K648 Other hemorrhoids: Secondary | ICD-10-CM | POA: Diagnosis not present

## 2023-11-08 DIAGNOSIS — Z860100 Personal history of colon polyps, unspecified: Secondary | ICD-10-CM

## 2023-11-08 DIAGNOSIS — Z1211 Encounter for screening for malignant neoplasm of colon: Secondary | ICD-10-CM | POA: Diagnosis not present

## 2023-11-08 DIAGNOSIS — K6289 Other specified diseases of anus and rectum: Secondary | ICD-10-CM

## 2023-11-08 DIAGNOSIS — Q438 Other specified congenital malformations of intestine: Secondary | ICD-10-CM

## 2023-11-08 MED ORDER — SODIUM CHLORIDE 0.9 % IV SOLN: 500.0000 mL | Freq: Once | INTRAVENOUS | Status: DC

## 2023-11-08 NOTE — Patient Instructions (Addendum)
Handouts given on polyps and hemorrhoids.  Samples given to pt for IBSrela to be taken  50mg  twice daily.  YOU HAD AN ENDOSCOPIC PROCEDURE TODAY AT THE [facility]:   Refer to the procedure report that was given to you for any specific questions about what was found during the examination.  If the procedure report does not answer your questions, please call your gastroenterologist to clarify.  If you requested that your care partner not be given the details of your procedure findings, then the procedure report has been included in a sealed envelope for you to review at your convenience later.  YOU SHOULD EXPECT: Some feelings of bloating in the abdomen. Passage of more gas than usual.  Walking can help get rid of the air that was put into your GI tract during the procedure and reduce the bloating. If you had a lower endoscopy (such as a colonoscopy or flexible sigmoidoscopy) you may notice spotting of blood in your stool or on the toilet paper. If you underwent a bowel prep for your procedure, you may not have a normal bowel movement for a few days.  Please Note:  You might notice some irritation and congestion in your nose or some drainage.  This is from the oxygen used during your procedure.  There is no need for concern and it should clear up in a day or so.  SYMPTOMS TO REPORT IMMEDIATELY:  Following lower endoscopy (colonoscopy or flexible sigmoidoscopy):  Excessive amounts of blood in the stool  Significant tenderness or worsening of abdominal pains  Swelling of the abdomen that is new, acute  Fever of 100F or higher   For urgent or emergent issues, a gastroenterologist can be reached at any hour by calling [phone]. Do not use MyChart messaging for urgent concerns.    DIET:  We do recommend a small meal at first, but then you may proceed to your regular diet.  Drink plenty of fluids but you should avoid alcoholic beverages for 24 hours.  ACTIVITY:  You should plan to  take it easy for the rest of today and you should NOT DRIVE or use heavy machinery until tomorrow (because of the sedation medicines used during the test).    FOLLOW UP: Our staff will call the number listed on your records the next business day following your procedure.  We will call around 7:15- 8:00 am to check on you and address any questions or concerns that you may have regarding the information given to you following your procedure. If we do not reach you, we will leave a message.     If any biopsies were taken you will be contacted by phone or by letter within the next 1-3 weeks.  Please call us at [phone] if you have not heard about the biopsies in 3 weeks.    SIGNATURES/CONFIDENTIALITY: You and/or your care partner have signed paperwork which will be entered into your electronic medical record.  These signatures attest to the fact that that the information above on your After Visit Summary has been reviewed and is understood.  Full responsibility of the confidentiality of this discharge information lies with you and/or your care-partner.

## 2023-11-08 NOTE — Progress Notes (Signed)
Pt's states no medical or surgical changes since previsit or office visit. 

## 2023-11-08 NOTE — Progress Notes (Signed)
[facility] History and Physical   Primary Care Physician:  Herma Carson, MD   Reason for Procedure:   History of colon polyps  Plan:    colonoscopy     HPI: Sophia Kelley is a 72 y.o. female  here for colonoscopy surveillance - last exam 02/2023 - [facility] - 12mm polyp removed however poor prep noted and told to repeat the exam. Double prep used for this exam. She suffers from chronic constipation.  No family history of colon cancer known. Otherwise feels well without any cardiopulmonary symptoms.   I have discussed risks / benefits of anesthesia and endoscopic procedure with Trevor Mace and they wish to proceed with the exams as outlined today.    Past Medical History:  Diagnosis Date   Allergy    Arthritis    right arm   Breast cancer of upper-outer quadrant of right female breast (HCC) 04/24/2016   Colon polyps    Complication of anesthesia    states was hard to wake up after 1 c-section   Constipation    Graves' disease    Radiation treatment   Hyperlipidemia    Immature cataract    Lumbar herniated disc    Morbid obesity (HCC)    Nasal congestion 05/08/2016   Obesity    Osteoporosis    Shortness of breath dyspnea    with ADLs - no home O2   Sinus problem    Sleep apnea    had sleep study, did not go back for CPAP fitting   Tachycardia    states due to Graves' disease    Past Surgical History:  Procedure Laterality Date   ABDOMINAL HYSTERECTOMY  1990   complete   BREAST BIOPSY     BREAST LUMPECTOMY Right    2017   BREAST LUMPECTOMY WITH RADIOACTIVE SEED AND SENTINEL LYMPH NODE BIOPSY Right 05/15/2016   Procedure: RIGHT BREAST LUMPECTOMY WITH RADIOACTIVE SEED AND SENTINEL LYMPH NODE BIOPSY;  Surgeon: Chevis Pretty III, MD;  Location: [facility];  Service: General;  Laterality: Right;   CARDIAC CATHETERIZATION N/A 11/01/2015   Procedure: Right/Left Heart Cath and Coronary Angiography;  Surgeon: Laurey Morale, MD;   Location: [facility] INVASIVE CV LAB;  Service: Cardiovascular;  Laterality: N/A;   CESAREAN SECTION  9147,8295   x 2   COLONOSCOPY WITH PROPOFOL  06/12/2014   LAPAROSCOPIC APPENDECTOMY N/A 01/31/2014   Procedure: APPENDECTOMY LAPAROSCOPIC;  Surgeon: Shelly Rubenstein, MD;  Location: [facility];  Service: General;  Laterality: N/A;   NASAL SEPTUM SURGERY  1984   RECTOCELE REPAIR  09/01/2007    Prior to Admission medications   Medication Sig Start Date End Date Taking? Authorizing Provider  ascorbic acid (VITAMIN C) 500 MG tablet Take 500 mg by mouth daily. 08/01/20  Yes [provider]  clindamycin (CLEOCIN T) 1 % lotion Apply topically. 08/20/22  Yes [provider]  levothyroxine (SYNTHROID) 175 MCG tablet Take 175 mcg by mouth daily before breakfast. 06/14/19  Yes [provider]  losartan (COZAAR) 100 MG tablet Take 100 mg by mouth daily.   Yes [provider]  lubiprostone (AMITIZA) 24 MCG capsule Take 24 mcg by mouth 2 (two) times daily with a meal.   Yes [provider]  Vitamin D, Ergocalciferol, (DRISDOL) 1.25 MG (50000 UNIT) CAPS capsule Take 50,000 Units by mouth every 7 (seven) days.   Yes [provider]  aspirin 81 MG tablet Take 81 mg by mouth daily.  [provider]  clotrimazole-betamethasone (LOTRISONE) lotion Apply topically 2 (two) times daily.    [provider]  cyanocobalamin (,VITAMIN B-12,) 1000 MCG/ML injection Inject 200 mcg into the muscle every 30 (thirty) days.  03/04/18   [provider]  cyclobenzaprine (FLEXERIL) 5 MG tablet Take 5 mg by mouth 3 (three) times daily as needed. 08/20/22   [provider]  furosemide (LASIX) 20 MG tablet Take 20 mg by mouth as needed for fluid.    [provider]  meclizine (ANTIVERT) 25 MG tablet Take 25 mg by mouth 3 (three) times daily as needed. 10/04/23   [provider]  Multiple Vitamin (MULTIVITAMIN) tablet Take 1 tablet by mouth  daily.    [provider]  nitroGLYCERIN (NITROSTAT) 0.4 MG SL tablet Place 0.4 mg under the tongue every 5 (five) minutes as needed for chest pain.  10/17/15   [provider]    Current Outpatient Medications  Medication Sig Dispense Refill   ascorbic acid (VITAMIN C) 500 MG tablet Take 500 mg by mouth daily.     clindamycin (CLEOCIN T) 1 % lotion Apply topically.     levothyroxine (SYNTHROID) 175 MCG tablet Take 175 mcg by mouth daily before breakfast.     losartan (COZAAR) 100 MG tablet Take 100 mg by mouth daily.     lubiprostone (AMITIZA) 24 MCG capsule Take 24 mcg by mouth 2 (two) times daily with a meal.     Vitamin D, Ergocalciferol, (DRISDOL) 1.25 MG (50000 UNIT) CAPS capsule Take 50,000 Units by mouth every 7 (seven) days.     aspirin 81 MG tablet Take 81 mg by mouth daily.     clotrimazole-betamethasone (LOTRISONE) lotion Apply topically 2 (two) times daily.     cyanocobalamin (,VITAMIN B-12,) 1000 MCG/ML injection Inject 200 mcg into the muscle every 30 (thirty) days.      cyclobenzaprine (FLEXERIL) 5 MG tablet Take 5 mg by mouth 3 (three) times daily as needed.     furosemide (LASIX) 20 MG tablet Take 20 mg by mouth as needed for fluid.     meclizine (ANTIVERT) 25 MG tablet Take 25 mg by mouth 3 (three) times daily as needed.     Multiple Vitamin (MULTIVITAMIN) tablet Take 1 tablet by mouth daily.     nitroGLYCERIN (NITROSTAT) 0.4 MG SL tablet Place 0.4 mg under the tongue every 5 (five) minutes as needed for chest pain.      Current Facility-Administered Medications  Medication Dose Route Frequency Provider Last Rate Last Admin   0.9 %  sodium chloride infusion  500 mL Intravenous Once Katheryne Gorr, Willaim Rayas, MD        Allergies as of 11/08/2023 - Review Complete 11/08/2023  Allergen Reaction Noted   Codeine Rash 05/29/2014    Family History  Problem Relation Age of Onset   Thyroid disease Mother    Hypertension Mother    Arthritis Mother     Osteoporosis Mother    Heart attack Father 52       x3   Allergies Father    Arthritis Father    Heart disease Father    Prostate cancer Brother    Colon cancer Neg Hx    Esophageal cancer Neg Hx    Rectal cancer Neg Hx    Stomach cancer Neg Hx     Social History   Socioeconomic History   Marital status: Widowed    Spouse name: Not on file   Number of children: 2  Years of education: 66   Highest education level: Not on file  Occupational History   Occupation: unemployed    Employer: UNEMPLOYED  Tobacco Use   Smoking status: Former    Current packs/day: 0.00    Types: Cigarettes    Start date: 05/14/1976    Quit date: 05/14/2016    Years since quitting: 7.4   Smokeless tobacco: Never   Tobacco comments:    5-6 cig./day  Vaping Use   Vaping status: Never Used  Substance and Sexual Activity   Alcohol use: No    Alcohol/week: 0.0 standard drinks of alcohol   Drug use: No   Sexual activity: Yes    Birth control/protection: Surgical  Other Topics Concern   Not on file  Social History Narrative   Caffeine Use - 1 cup coffee/day   Patient is right handed.      Social Determinants of Health   Financial Resource Strain: Not on file  Food Insecurity: Not on file  Transportation Needs: Not on file  Physical Activity: Not on file  Stress: Not on file  Social Connections: Unknown (04/17/2022)   Received from [facility], [facility]   Social Network    Social Network: Not on file  Intimate Partner Violence: Unknown (03/16/2022)   Received from [facility], [facility]   HITS    Physically Hurt: Not on file    Insult or Talk Down To: Not on file    Threaten Physical Harm: Not on file    Scream or Curse: Not on file    Review of Systems: All other review of systems negative except as mentioned in the HPI.  Physical Exam: Vital signs BP 113/72   Pulse 71   Temp 98 F (36.7 C) (Temporal)   Ht 5\' 6"  (1.676 m)   Wt 270 lb (122.5 kg)   LMP  (LMP Unknown)    SpO2 96%   BMI 43.58 kg/m   General:   Alert,  Well-developed, pleasant and cooperative in NAD Lungs:  Clear throughout to auscultation.   Heart:  Regular rate and rhythm Abdomen:  Soft, nontender and nondistended.   Neuro/Psych:  Alert and cooperative. Normal mood and affect. A and O x 3  Harlin Rain, MD [facility]

## 2023-11-08 NOTE — Progress Notes (Signed)
Called to room to assist during endoscopic procedure.  Patient ID and intended procedure confirmed with present staff. Received instructions for my participation in the procedure from the performing physician.  

## 2023-11-08 NOTE — Op Note (Signed)
[facility] Patient Name: Sophia Kelley Procedure Date: 11/08/2023 11:51 AM MRN: [id number] Endoscopist: Viviann Spare P. Adela Lank , MD, [id number] Age: 72 Referring MD:  Date of Birth: 12-20-1950 Gender: Female Account #: [id number] Procedure:                Colonoscopy Indications:              High risk colon cancer surveillance: Personal                            history of colonic polyps - 12mm polyp removed                            during colonoscopy 02/2023 per [facility] but                            poor prep noted, here for repeat exam, used double                            prep. History of worsening constipation. Failed                            Linzess and Amitiza so far. Medicines:                Monitored Anesthesia Care Procedure:                Pre-Anesthesia Assessment:                           - Prior to the procedure, a History and Physical                            was performed, and patient medications and                            allergies were reviewed. The patient's tolerance of                            previous anesthesia was also reviewed. The risks                            and benefits of the procedure and the sedation                            options and risks were discussed with the patient.                            All questions were answered, and informed consent                            was obtained. Prior Anticoagulants: The patient has                            taken no anticoagulant or antiplatelet agents. ASA  Grade Assessment: III - A patient with severe                            systemic disease. After reviewing the risks and                            benefits, the patient was deemed in satisfactory                            condition to undergo the procedure.                           After obtaining informed consent, the colonoscope                            was passed under direct  vision. Throughout the                            procedure, the patient's blood pressure, pulse, and                            oxygen saturations were monitored continuously. The                            Olympus Scope SN: [id number] was introduced through                            the anus and advanced to the the cecum, identified                            by appendiceal orifice and ileocecal valve. The                            colonoscopy was performed without difficulty. The                            patient tolerated the procedure well. The quality                            of the bowel preparation was adequate. The                            ileocecal valve, appendiceal orifice, and rectum                            were photographed. Scope In: 12:01:06 PM Scope Out: 12:28:48 PM Scope Withdrawal Time: 0 hours 22 minutes 16 seconds  Total Procedure Duration: 0 hours 27 minutes 42 seconds  Findings:                 The perianal and digital rectal examinations were                            normal.  The colon was long and redundant.                           A 3 to 4 mm polyp was found in the cecum. The polyp                            was flat. The polyp was removed with a cold snare.                            Resection and retrieval were complete.                           Two flat polyps were found in the transverse colon.                            The polyps were 4 to 7 mm in size. These polyps                            were removed with a cold snare. Resection and                            retrieval were complete.                           Anal papilla(e) were hypertrophied. Biopsies were                            taken with a cold forceps for histology to rule out                            AIN.                           Internal hemorrhoids were found during retroflexion.                           The exam was otherwise without  abnormality. Complications:            No immediate complications. Estimated blood loss:                            Minimal. Estimated Blood Loss:     Estimated blood loss was minimal. Impression:               - Long / redundant colon.                           - One 3 to 4 mm polyp in the cecum, removed with a                            cold snare. Resected and retrieved.                           - Two 4 to 7 mm polyps in the transverse colon,  removed with a cold snare. Resected and retrieved.                           - Anal papilla(e) were hypertrophied. Biopsied to                            rule out AIN.                           - Internal hemorrhoids.                           - The examination was otherwise normal. Recommendation:           - Patient has a contact number available for                            emergencies. The signs and symptoms of potential                            delayed complications were discussed with the                            patient. Return to normal activities tomorrow.                            Written discharge instructions were provided to the                            patient.                           - Resume previous diet.                           - Continue present medications.                           - Await pathology results.                           - Consideration for trial of IBSrela - will give                            free samples if available, 50mg  twice daily, and                            prescription if this works better for her. Viviann Spare P. Jhoan Schmieder, MD 11/08/2023 12:35:23 PM This report has been signed electronically.

## 2023-11-08 NOTE — Progress Notes (Signed)
Report given to PACU, vss 

## 2023-11-09 ENCOUNTER — Telehealth: Payer: Self-pay | Admitting: *Deleted

## 2023-11-09 NOTE — Telephone Encounter (Signed)
No answer for post procedure follow up unable to leave VM.

## 2023-11-10 LAB — SURGICAL PATHOLOGY

## 2023-11-11 ENCOUNTER — Encounter: Payer: Self-pay | Admitting: Gastroenterology

## 2023-11-17 ENCOUNTER — Ambulatory Visit: Payer: Medicare Other

## 2023-11-24 ENCOUNTER — Ambulatory Visit
Admission: RE | Admit: 2023-11-24 | Discharge: 2023-11-24 | Disposition: A | Discharge status: Home / Self Care | Payer: Medicare Other | Source: Ambulatory Visit | Attending: General Surgery | Admitting: General Surgery

## 2023-11-24 DIAGNOSIS — Z1231 Encounter for screening mammogram for malignant neoplasm of breast: Secondary | ICD-10-CM

## 2023-12-10 ENCOUNTER — Telehealth: Payer: Self-pay | Admitting: Gastroenterology

## 2023-12-10 NOTE — Telephone Encounter (Signed)
Inbound call from patient stating that her PCP is wanting her to start taking Prolia shots for her bone damage. Patient is wanting Dr. Venida Jarvis recommendations to see if it is going to cause issues with constipation or anything else GI related. Please advise.

## 2023-12-10 NOTE — Telephone Encounter (Signed)
Dr Armbruster, please advise... 

## 2023-12-12 NOTE — Telephone Encounter (Signed)
Prolia can rarely make constipation worse, but it is actually more likely to cause loose stools. I think okay to proceed taking it and see how she responds. Hopefully, more than likely, she will have no GI side effects but we can treat as needed depending how much it bothers her. She can keep [place] posted if she has questions about it moving forward, do not know how she will respond until she tries it

## 2023-12-13 NOTE — Telephone Encounter (Signed)
Patient advised of Dr Lanetta Inch response/recommendations. She verbalizes understanding.

## 2023-12-17 ENCOUNTER — Ambulatory Visit (INDEPENDENT_AMBULATORY_CARE_PROVIDER_SITE_OTHER): Payer: Medicare Other | Admitting: Podiatry

## 2023-12-17 ENCOUNTER — Encounter: Payer: Self-pay | Admitting: Podiatry

## 2023-12-17 DIAGNOSIS — B351 Tinea unguium: Secondary | ICD-10-CM

## 2023-12-17 DIAGNOSIS — M79674 Pain in right toe(s): Secondary | ICD-10-CM | POA: Diagnosis not present

## 2023-12-17 DIAGNOSIS — M79675 Pain in left toe(s): Secondary | ICD-10-CM

## 2023-12-20 NOTE — Progress Notes (Signed)
 Chief Complaint  Patient presents with   Foot Care    Not diabetic. No anticoag. Needs nails trimmed. Callus on LT 1st hallux. Patient has severe back pain and is awaiting spinal surgery.     HPI: 73 y.o. female presents today for treatment of painful, thickened, elongated, fungal toenails.  They are painful direct compression.  She is unable to maintain them herself due to the increased thickness and due to her back pain.  She states some of the nails are beginning to incurvated and are painful.  Past Medical History:  Diagnosis Date   Allergy    Arthritis    right arm   Breast cancer of upper-outer quadrant of right female breast (HCC) 04/24/2016   Colon polyps    Complication of anesthesia    states was hard to wake up after 1 c-section   Constipation    Graves' disease    Radiation treatment   Hyperlipidemia    Immature cataract    Lumbar herniated disc    Morbid obesity (HCC)    Nasal congestion 05/08/2016   Obesity    Osteoporosis    Shortness of breath dyspnea    with ADLs - no home O2   Sinus problem    Sleep apnea    had sleep study, did not go back for CPAP fitting   Tachycardia    states due to Graves' disease    Past Surgical History:  Procedure Laterality Date   ABDOMINAL HYSTERECTOMY  1990   complete   BREAST BIOPSY     BREAST LUMPECTOMY Right    2017   BREAST LUMPECTOMY WITH RADIOACTIVE SEED AND SENTINEL LYMPH NODE BIOPSY Right 05/15/2016   Procedure: RIGHT BREAST LUMPECTOMY WITH RADIOACTIVE SEED AND SENTINEL LYMPH NODE BIOPSY;  Surgeon: Deward Null III, MD;  Location: [facility];  Service: General;  Laterality: Right;   CARDIAC CATHETERIZATION N/A 11/01/2015   Procedure: Right/Left Heart Cath and Coronary Angiography;  Surgeon: Ezra GORMAN Shuck, MD;  Location: [facility] INVASIVE CV LAB;  Service: Cardiovascular;  Laterality: N/A;   CESAREAN SECTION  8019,8011   x 2   COLONOSCOPY WITH PROPOFOL   06/12/2014   LAPAROSCOPIC APPENDECTOMY N/A  01/31/2014   Procedure: APPENDECTOMY LAPAROSCOPIC;  Surgeon: Vicenta DELENA Poli, MD;  Location: [facility];  Service: General;  Laterality: N/A;   NASAL SEPTUM SURGERY  1984   RECTOCELE REPAIR  09/01/2007    Allergies  Allergen Reactions   Codeine Rash    ROS negative except as stated in HPI   Physical Exam: There were no vitals filed for this visit.  General: The patient is alert and oriented x3 in no acute distress.  Dermatology: Skin is warm, dry and supple bilateral lower extremities. Interspaces are clear of maceration and debris.  Nails 1 through 5 bilaterally are thickened, elongated, dystrophic, discolored with subungual debris.  Toenails 1-3 bilaterally are incurvated.  All toenails are painful with direct dorsal compression  Vascular: Palpable pedal pulses bilaterally. Capillary refill within normal limits.  No appreciable edema.  No erythema or calor.  Neurological: Light touch sensation grossly intact bilateral feet.   Musculoskeletal Exam: No pedal deformities noted    Assessment/Plan of Care: 1. Pain due to onychomycosis of toenails of both feet      No orders of the defined types were placed in this encounter.  None  Discussed clinical findings with patient today.  # Onychomycosis painful toenails -Toenails 1 through 5 bilaterally are debrided  in thickness and length using aseptic nail nippers and burred down to reduce thickness. -Mechanical bur used to file nails then -No iatrogenic bleeding encountered. -Patient may follow-up in 9-10 weeks for routine care   Kaipo Ardis L. Lamount MAUL, AACFAS [facility]     [address] [place], [place] [address]                Office [phone]  Fax [phone]

## 2024-01-10 ENCOUNTER — Telehealth: Payer: Self-pay | Admitting: Pharmacy Technician

## 2024-01-10 ENCOUNTER — Other Ambulatory Visit: Payer: Self-pay | Admitting: Obstetrics and Gynecology

## 2024-01-10 ENCOUNTER — Telehealth: Payer: Self-pay | Admitting: Gastroenterology

## 2024-01-10 NOTE — Telephone Encounter (Signed)
Per 12/10/23 telephone note: Armbruster, Willaim Rayas, MD to Richardson Chiquito, RN Prolia can rarely make constipation worse, but it is actually more likely to cause loose stools. I think okay to proceed taking it and see how she responds. Hopefully, more than likely, she will have no GI side effects but we can treat as needed depending how much it bothers her. She can keep [place] posted if she has questions about it moving forward, do not know how she will respond until she tries it.  Called and spoke with patient regarding recommendations above. Pt verbalized understanding and had no concerns at the end of the call.

## 2024-01-10 NOTE — Telephone Encounter (Signed)
Patient called stated she was recommended to get an injection to strengthen her bones, she stated having issues with chronic constipation therefore she would like to know if the injection will effect that or not. Please advise.

## 2024-01-10 NOTE — Telephone Encounter (Signed)
Auth Submission: NO AUTH NEEDED Site of care: Site of care: [facility] Payer: MEDICARE A/B & SUPP Medication & CPT/J Code(s) submitted: Prolia (Denosumab) [id number] Route of submission (phone, fax, portal):  Phone # Fax # Auth type: Buy/Bill PB Units/visits requested: X2 DOSES Reference number:  Approval from: 01/10/24 to 01/13/25

## 2024-01-12 ENCOUNTER — Ambulatory Visit: Payer: Medicare Other

## 2024-01-12 MED ORDER — DENOSUMAB 60 MG/ML ~~LOC~~ SOSY: 60.0000 mg | PREFILLED_SYRINGE | Freq: Once | SUBCUTANEOUS | Status: DC

## 2024-02-18 ENCOUNTER — Ambulatory Visit: Payer: Medicare Other | Admitting: Podiatry

## 2024-02-21 ENCOUNTER — Ambulatory Visit: Payer: Medicare Other

## 2024-02-21 VITALS — BP 140/84 | HR 71 | Temp 98.2°F | Resp 12 | Ht 64.0 in | Wt 256.8 lb

## 2024-02-21 DIAGNOSIS — M810 Age-related osteoporosis without current pathological fracture: Secondary | ICD-10-CM | POA: Diagnosis not present

## 2024-02-21 MED ORDER — DENOSUMAB 60 MG/ML ~~LOC~~ SOSY
60.0000 mg | PREFILLED_SYRINGE | Freq: Once | SUBCUTANEOUS | Status: AC
  Administered 2024-02-21: 60 mg via SUBCUTANEOUS
  Filled 2024-02-21: qty 1

## 2024-02-21 NOTE — Progress Notes (Signed)
 Diagnosis: Osteoporosis  Provider:  Chilton Greathouse MD  Procedure: Injection  Prolia (Denosumab), Dose: 60 mg, Site: subcutaneous, Number of injections: 1  Injection Site(s): Left arm  Post Care: Observation period completed  Discharge: Condition: Good, Destination: Home . AVS Provided  Performed by:  Wyvonne Lenz, RN

## 2024-03-02 ENCOUNTER — Ambulatory Visit (INDEPENDENT_AMBULATORY_CARE_PROVIDER_SITE_OTHER): Admitting: Podiatry

## 2024-03-02 ENCOUNTER — Encounter: Payer: Self-pay | Admitting: Podiatry

## 2024-03-02 DIAGNOSIS — B351 Tinea unguium: Secondary | ICD-10-CM

## 2024-03-02 DIAGNOSIS — M79674 Pain in right toe(s): Secondary | ICD-10-CM | POA: Diagnosis not present

## 2024-03-02 DIAGNOSIS — M79675 Pain in left toe(s): Secondary | ICD-10-CM

## 2024-03-02 DIAGNOSIS — M792 Neuralgia and neuritis, unspecified: Secondary | ICD-10-CM

## 2024-03-02 DIAGNOSIS — B353 Tinea pedis: Secondary | ICD-10-CM

## 2024-03-02 MED ORDER — GABAPENTIN 100 MG PO CAPS
100.0000 mg | ORAL_CAPSULE | Freq: Three times a day (TID) | ORAL | 3 refills | Status: DC
Start: 2024-03-02 — End: 2024-03-23

## 2024-03-02 MED ORDER — KETOCONAZOLE 2 % EX CREA: 1.0000 | TOPICAL_CREAM | Freq: Every day | CUTANEOUS | 0 refills | Status: AC

## 2024-03-02 NOTE — Progress Notes (Signed)
 Chief Complaint  Patient presents with   Routine foot care    Rm15:Est RFC. Pt states she has had some numbness in the left foot since having procedure on left side of spine. She would like to discuss that and trimming nails.     HPI: 73 y.o. female presents today for treatment of painful, thickened, elongated, fungal toenails.  They are painful direct compression.  She is unable to maintain them herself due to the increased thickness and due to her back pain.  She states some of the nails are beginning to incurvated and are painful. She is concerned about progressive thickening to the left foot toenails in particular.  She also endorses red, flaky rash to the left foot.  She does report severe back pain limiting mobility, she is getting back surgery soon she reports.  Past Medical History:  Diagnosis Date   Allergy    Arthritis    right arm   Breast cancer of upper-outer quadrant of right female breast (HCC) 04/24/2016   Colon polyps    Complication of anesthesia    states was hard to wake up after 1 c-section   Constipation    Graves' disease    Radiation treatment   Hyperlipidemia    Immature cataract    Lumbar herniated disc    Morbid obesity (HCC)    Nasal congestion 05/08/2016   Obesity    Osteoporosis    Shortness of breath dyspnea    with ADLs - no home O2   Sinus problem    Sleep apnea    had sleep study, did not go back for CPAP fitting   Tachycardia    states due to Graves' disease    Past Surgical History:  Procedure Laterality Date   ABDOMINAL HYSTERECTOMY  1990   complete   BREAST BIOPSY     BREAST LUMPECTOMY Right    2017   BREAST LUMPECTOMY WITH RADIOACTIVE SEED AND SENTINEL LYMPH NODE BIOPSY Right 05/15/2016   Procedure: RIGHT BREAST LUMPECTOMY WITH RADIOACTIVE SEED AND SENTINEL LYMPH NODE BIOPSY;  Surgeon: Chevis Pretty III, MD;  Location:  SURGERY CENTER;  Service: General;  Laterality: Right;   CARDIAC CATHETERIZATION N/A 11/01/2015    Procedure: Right/Left Heart Cath and Coronary Angiography;  Surgeon: Laurey Morale, MD;  Location: [facility] INVASIVE CV LAB;  Service: Cardiovascular;  Laterality: N/A;   CESAREAN SECTION  1610,9604   x 2   COLONOSCOPY WITH PROPOFOL  06/12/2014   LAPAROSCOPIC APPENDECTOMY N/A 01/31/2014   Procedure: APPENDECTOMY LAPAROSCOPIC;  Surgeon: Shelly Rubenstein, MD;  Location: [facility];  Service: General;  Laterality: N/A;   NASAL SEPTUM SURGERY  1984   RECTOCELE REPAIR  09/01/2007    Allergies  Allergen Reactions   Codeine Rash    ROS negative except as stated in HPI   Physical Exam: There were no vitals filed for this visit.  General: The patient is alert and oriented x3 in no acute distress.  Dermatology: Skin is warm, dry and supple bilateral lower extremities. Interspaces are clear of maceration and debris.  Nails 1 through 5 bilaterally are thickened, elongated, dystrophic, discolored with subungual debris.  Significant progression to the left foot toenails.  All toenails are painful with direct dorsal compression.  Left foot greater than right foot there is dry, flaky, xerotic pedal skin with erythematous rash.  Vascular: Palpable pedal pulses bilaterally. Capillary refill within normal limits.  No appreciable edema.  No erythema or calor.  Neurological: Light touch sensation grossly intact bilateral feet.  Subjective neuropathy symptoms left foot  Musculoskeletal Exam: No pedal deformities noted    Assessment/Plan of Care: 1. Pain due to onychomycosis of toenails of both feet   2. Neuropathic pain   3. Tinea pedis of left foot      Meds ordered this encounter  Medications   ketoconazole (NIZORAL) 2 % cream    Sig: Apply 1 Application topically daily for 21 days.    Dispense:  21 g    Refill:  0   gabapentin (NEURONTIN) 100 MG capsule    Sig: Take 1 capsule (100 mg total) by mouth 3 (three) times daily.    Dispense:  90 capsule    Refill:  3   None  Discussed clinical  findings with patient today.  # Onychomycosis painful toenails -Toenails 1 through 5 bilaterally are debrided in thickness and length using aseptic nail nippers and burred down to reduce thickness. -Mechanical bur used to file nails then -No iatrogenic bleeding encountered. -Nail specimen obtained from the left foot toenail to be sent for fungal pathology, patient wishing to try oral fungal treatment if positive - Follow-up in 3 weeks to review nail pathology  # Neuropathic pain -Starting course of oral gabapentin 100 mg 3 times a day.  Discussed potential side effects, advised her to try taking at night if any of these occur -Reports that she is having back surgery soon, hopefully this may alleviate some of the neuropathic symptoms over time  # Tinea pedis -Discussed importance of good pedal hygiene with patient.  She is quite limited by her mobility -We will try topical ketoconazole cream for now if she is able to apply this. -Oral terbinafine may be helpful as well if we initiate this for her nails several weeks   Zyair Rhein L. Marchia Bond, AACFAS [facility]     [address] [place], [place] [address]                Office [phone]  Fax [phone]

## 2024-03-16 ENCOUNTER — Other Ambulatory Visit: Payer: Self-pay | Admitting: Podiatry

## 2024-03-16 NOTE — Progress Notes (Signed)
 culture

## 2024-03-20 ENCOUNTER — Telehealth: Payer: Self-pay

## 2024-03-20 ENCOUNTER — Telehealth: Payer: Self-pay | Admitting: Gastroenterology

## 2024-03-20 ENCOUNTER — Encounter: Payer: Self-pay | Admitting: Obstetrics and Gynecology

## 2024-03-20 ENCOUNTER — Other Ambulatory Visit (HOSPITAL_COMMUNITY): Payer: Self-pay

## 2024-03-20 MED ORDER — IBSRELA 50 MG PO TABS: 1.0000 | ORAL_TABLET | Freq: Two times a day (BID) | ORAL | 1 refills | Status: DC

## 2024-03-20 NOTE — Telephone Encounter (Signed)
 Pharmacy Patient Advocate Encounter  Received notification from [employer] Medicare that Prior Authorization for  Ibsrela 50MG  tablets has been APPROVED from 03-06-2024 to until further notice   PA #/Case ID/Reference #: [id number]

## 2024-03-20 NOTE — Telephone Encounter (Signed)
 Pharmacy Patient Advocate Encounter   Received notification from CoverMyMeds that prior authorization for Ibsrela 50MG  tablets is required/requested.   Insurance verification completed.   The patient is insured through [facility] .   Per test claim: PA required; PA submitted to above mentioned insurance via CoverMyMeds Key/confirmation #/EOC [facility] Status is pending

## 2024-03-20 NOTE — Telephone Encounter (Signed)
 Inbound call from patient stating Ibsrela 50 mg has been working well for her and would like for prescription to be sent to [facility]. Please advise, thank you.

## 2024-03-20 NOTE — Telephone Encounter (Signed)
 Rx sent

## 2024-03-23 ENCOUNTER — Encounter: Payer: Self-pay | Admitting: Podiatry

## 2024-03-23 ENCOUNTER — Ambulatory Visit (INDEPENDENT_AMBULATORY_CARE_PROVIDER_SITE_OTHER): Admitting: Podiatry

## 2024-03-23 VITALS — Ht 64.0 in | Wt 256.8 lb

## 2024-03-23 DIAGNOSIS — B351 Tinea unguium: Secondary | ICD-10-CM | POA: Diagnosis not present

## 2024-03-23 DIAGNOSIS — M79674 Pain in right toe(s): Secondary | ICD-10-CM | POA: Diagnosis not present

## 2024-03-23 DIAGNOSIS — M792 Neuralgia and neuritis, unspecified: Secondary | ICD-10-CM | POA: Diagnosis not present

## 2024-03-23 DIAGNOSIS — M79675 Pain in left toe(s): Secondary | ICD-10-CM | POA: Diagnosis not present

## 2024-03-23 MED ORDER — GABAPENTIN 300 MG PO CAPS: 300.0000 mg | ORAL_CAPSULE | Freq: Two times a day (BID) | ORAL | 3 refills | Status: DC

## 2024-03-23 MED ORDER — TERBINAFINE HCL 250 MG PO TABS: 250.0000 mg | ORAL_TABLET | Freq: Every day | ORAL | 1 refills | Status: DC

## 2024-03-23 NOTE — Progress Notes (Signed)
 Chief Complaint  Patient presents with   Peripheral Neuropathy    " I am here to see how the medication is  working ":     HPI: 73 y.o. female presents today to follow-up on nail pathology and for peripheral neuropathy.  The gabapentin has helped somewhat.  Patient endorses history of back and neck pain which limits her mobility.  She is seeing back specialist for this.  They have offered injections at this point.  Past Medical History:  Diagnosis Date   Allergy    Arthritis    right arm   Breast cancer of upper-outer quadrant of right female breast (HCC) 04/24/2016   Colon polyps    Complication of anesthesia    states was hard to wake up after 1 c-section   Constipation    Graves' disease    Radiation treatment   Hyperlipidemia    Immature cataract    Lumbar herniated disc    Morbid obesity (HCC)    Nasal congestion 05/08/2016   Obesity    Osteoporosis    Shortness of breath dyspnea    with ADLs - no home O2   Sinus problem    Sleep apnea    had sleep study, did not go back for CPAP fitting   Tachycardia    states due to Graves' disease    Past Surgical History:  Procedure Laterality Date   ABDOMINAL HYSTERECTOMY  1990   complete   BREAST BIOPSY     BREAST LUMPECTOMY Right    2017   BREAST LUMPECTOMY WITH RADIOACTIVE SEED AND SENTINEL LYMPH NODE BIOPSY Right 05/15/2016   Procedure: RIGHT BREAST LUMPECTOMY WITH RADIOACTIVE SEED AND SENTINEL LYMPH NODE BIOPSY;  Surgeon: Lillette Reid III, MD;  Location: [facility];  Service: General;  Laterality: Right;   CARDIAC CATHETERIZATION N/A 11/01/2015   Procedure: Right/Left Heart Cath and Coronary Angiography;  Surgeon: Darlis Eisenmenger, MD;  Location: [facility] INVASIVE CV LAB;  Service: Cardiovascular;  Laterality: N/A;   CESAREAN SECTION  6045,4098   x 2   COLONOSCOPY WITH PROPOFOL  06/12/2014   LAPAROSCOPIC APPENDECTOMY N/A 01/31/2014   Procedure: APPENDECTOMY LAPAROSCOPIC;  Surgeon: Rogena Class, MD;   Location: [facility];  Service: General;  Laterality: N/A;   NASAL SEPTUM SURGERY  1984   RECTOCELE REPAIR  09/01/2007    Allergies  Allergen Reactions   Codeine Rash    ROS    Physical Exam: There were no vitals filed for this visit.  General: The patient is alert and oriented x3 in no acute distress.  Dermatology: No plates 1 through 5 bilaterally are thickened, dystrophic with yellow discoloration and subungual debris.  Within normal limits for length at this point.  Vascular: Palpable pedal pulses bilaterally. Capillary refill within normal limits.  No appreciable edema.  No erythema or calor.  Neurological: Light touch sensation grossly intact bilateral feet.  Subjective neuropathy symptoms left foot greater than right.  Musculoskeletal Exam: No pedal deformities noted   Assessment/Plan of Care: 1. Pain due to onychomycosis of toenails of both feet   2. Neuropathic pain      Meds ordered this encounter  Medications   terbinafine (LAMISIL) 250 MG tablet    Sig: Take 1 tablet (250 mg total) by mouth daily.    Dispense:  90 tablet    Refill:  1   gabapentin (NEURONTIN) 300 MG capsule    Sig: Take 1 capsule (300 mg total) by mouth  2 (two) times daily.    Dispense:  60 capsule    Refill:  3   None  Discussed clinical findings with patient today.  # Onychomycosis of toenails with pain - Nail pathology reviewed with patient, positive for onychomycosis - Patient would like to try oral medication.  Discussed treatment course at length with patient.  Starting her along 250 mg terbinafine for 90 days - Obtain CBC, LFT  # Neuropathic pain - Gabapentin 100 mg 3 times daily has been tolerated well by the patient, she does endorse continued neuropathic pain -Increasing to 300 mg twice daily -Did recommend that she talk to her back specialist about a spinal cord stimulator, believe she could benefit from this.  Follow-up in about 6 weeks for onychomycosis, nail trim   Danne Vasek  L. Lunda Salines, AACFAS [facility]     [address] [place], [place] [address]                Office [phone]  Fax [phone]

## 2024-03-31 LAB — HEPATIC FUNCTION PANEL
AG Ratio: 1.7 (calc) (ref 1.0–2.5)
ALT: 14 U/L (ref 6–29)
AST: 14 U/L (ref 10–35)
Albumin: 4.1 g/dL (ref 3.6–5.1)
Alkaline phosphatase (APISO): 58 U/L (ref 37–153)
Bilirubin, Direct: 0.1 mg/dL (ref 0.0–0.2)
Globulin: 2.4 g/dL (ref 1.9–3.7)
Indirect Bilirubin: 0.5 mg/dL (ref 0.2–1.2)
Total Bilirubin: 0.6 mg/dL (ref 0.2–1.2)
Total Protein: 6.5 g/dL (ref 6.1–8.1)

## 2024-03-31 LAB — CBC
HCT: 46.3 % — ABNORMAL HIGH (ref 35.0–45.0)
Hemoglobin: 14.7 g/dL (ref 11.7–15.5)
MCH: 28.9 pg (ref 27.0–33.0)
MCHC: 31.7 g/dL — ABNORMAL LOW (ref 32.0–36.0)
MCV: 91 fL (ref 80.0–100.0)
MPV: 9.6 fL (ref 7.5–12.5)
Platelets: 157 10*3/uL (ref 140–400)
RBC: 5.09 10*6/uL (ref 3.80–5.10)
RDW: 13.6 % (ref 11.0–15.0)
WBC: 7.8 10*3/uL (ref 3.8–10.8)

## 2024-04-03 NOTE — Telephone Encounter (Signed)
 Patient calling in regards to previous note please advise.

## 2024-04-03 NOTE — Telephone Encounter (Signed)
 Called patient and let her know PA for IBSrela  was approved. Asked her to call back with addition questions. (Note -If patient calls back - please get more information about what she is asking/requesting, as specific as possible)

## 2024-04-06 MED ORDER — IBSRELA 50 MG PO TABS: 1.0000 | ORAL_TABLET | Freq: Two times a day (BID) | ORAL | 3 refills | Status: DC

## 2024-04-06 NOTE — Addendum Note (Signed)
 Addended by: Kenai Fluegel K on: 04/06/2024 09:30 AM   Modules accepted: Orders

## 2024-04-06 NOTE — Telephone Encounter (Signed)
 Informed patient the prescription was sent to the pharmacy in April and is also was approved with her insurance. She can pick it up at the pharmacy. Patient verbalized understanding.

## 2024-04-06 NOTE — Telephone Encounter (Signed)
 Ibsrela  sent to pharmacy

## 2024-04-06 NOTE — Telephone Encounter (Signed)
 PT is calling to ask why Ibsrela  has not been called in yet after getting approval. She said that she needs it really bad because she's starting to feel bad again. Please advise.

## 2024-04-13 MED ORDER — IBSRELA 50 MG PO TABS: 1.0000 | ORAL_TABLET | Freq: Two times a day (BID) | ORAL | 3 refills | Status: DC

## 2024-04-13 NOTE — Telephone Encounter (Signed)
 PT called to update that her pharmacy, [facility] does not Ibsrela  but [facility] in [place] ([address] has it and she would like to have it sent there. Please advise.

## 2024-04-13 NOTE — Telephone Encounter (Signed)
 Patient states she needs her prescription for Ibsrella sent to [facility] on [address] in [place], [place]. Informed patient I sent her prescription to her pharmacy per her request.

## 2024-04-13 NOTE — Addendum Note (Signed)
 Addended by: MADAN, Alane Hanssen L on: 04/13/2024 10:04 AM   Modules accepted: Orders

## 2024-04-13 NOTE — Addendum Note (Signed)
 Addended by: MADAN, Reyes Fifield L on: 04/13/2024 11:39 AM   Modules accepted: Orders

## 2024-05-11 ENCOUNTER — Ambulatory Visit (INDEPENDENT_AMBULATORY_CARE_PROVIDER_SITE_OTHER): Admitting: Podiatry

## 2024-05-11 ENCOUNTER — Encounter: Payer: Self-pay | Admitting: Podiatry

## 2024-05-11 DIAGNOSIS — M79674 Pain in right toe(s): Secondary | ICD-10-CM

## 2024-05-11 DIAGNOSIS — M79675 Pain in left toe(s): Secondary | ICD-10-CM

## 2024-05-11 DIAGNOSIS — B351 Tinea unguium: Secondary | ICD-10-CM

## 2024-05-11 DIAGNOSIS — M792 Neuralgia and neuritis, unspecified: Secondary | ICD-10-CM

## 2024-05-11 NOTE — Patient Instructions (Addendum)
 I have ordered a neurosurgery referral with Dr. Crecencio Dodge of [facility]. If you do not hear for them about scheduling within the next 1 week, or you have any questions please give us  a call at [phone].

## 2024-05-11 NOTE — Progress Notes (Signed)
 Chief Complaint  Patient presents with   Nail Problem    Rm15/  6 week follow up onychomycosis    HPI: 73 y.o. female presents today for onychomycosis.  She is prescribed terbinafine  last visit, he states that she antimetabolites because she was unsure about labs.  Fortunately her liver function panel has been within normal limits.  Nails are thickened, morning, dystrophic and painful with direct dorsal palpation.  Patient is unable to maintain soft and tender thickness and due to mobility issues.  She does continue to endorse neuropathic pain.  She does see a back specialist and they have not offered intervention, she is considering getting a second opinion.  Past Medical History:  Diagnosis Date   Allergy    Arthritis    right arm   Breast cancer of upper-outer quadrant of right female breast (HCC) 04/24/2016   Colon polyps    Complication of anesthesia    states was hard to wake up after 1 c-section   Constipation    Graves' disease    Radiation treatment   Hyperlipidemia    Immature cataract    Lumbar herniated disc    Morbid obesity (HCC)    Nasal congestion 05/08/2016   Obesity    Osteoporosis    Shortness of breath dyspnea    with ADLs - no home O2   Sinus problem    Sleep apnea    had sleep study, did not go back for CPAP fitting   Tachycardia    states due to Graves' disease    Past Surgical History:  Procedure Laterality Date   ABDOMINAL HYSTERECTOMY  1990   complete   BREAST BIOPSY     BREAST LUMPECTOMY Right    2017   BREAST LUMPECTOMY WITH RADIOACTIVE SEED AND SENTINEL LYMPH NODE BIOPSY Right 05/15/2016   Procedure: RIGHT BREAST LUMPECTOMY WITH RADIOACTIVE SEED AND SENTINEL LYMPH NODE BIOPSY;  Surgeon: Lillette Reid III, MD;  Location: [facility];  Service: General;  Laterality: Right;   CARDIAC CATHETERIZATION N/A 11/01/2015   Procedure: Right/Left Heart Cath and Coronary Angiography;  Surgeon: Darlis Eisenmenger, MD;  Location: [facility] INVASIVE  CV LAB;  Service: Cardiovascular;  Laterality: N/A;   CESAREAN SECTION  1610,9604   x 2   COLONOSCOPY WITH PROPOFOL   06/12/2014   LAPAROSCOPIC APPENDECTOMY N/A 01/31/2014   Procedure: APPENDECTOMY LAPAROSCOPIC;  Surgeon: Rogena Class, MD;  Location: [facility];  Service: General;  Laterality: N/A;   NASAL SEPTUM SURGERY  1984   RECTOCELE REPAIR  09/01/2007    Allergies  Allergen Reactions   Codeine Rash    ROS    Physical Exam: There were no vitals filed for this visit.  General: The patient is alert and oriented x3 in no acute distress.  Dermatology: No plates 1 through 5 bilaterally are thickened, elongated, dystrophic with yellow discoloration and subungual debris.  Tender on direct dorsal palpation of nail plates V40  Vascular: Palpable pedal pulses bilaterally. Capillary refill within normal limits.  No appreciable edema.  No erythema or calor.  Neurological: Light touch sensation grossly intact bilateral feet.  Subjective neuropathy symptoms left foot greater than right.  Musculoskeletal Exam: No pedal deformities noted   Assessment/Plan of Care: 1. Pain due to onychomycosis of toenails of both feet   2. Neuropathic pain      No orders of the defined types were placed in this encounter.  AMB REFERRAL TO NEUROSURGERY  Discussed clinical findings with  patient today.  # Onychomycosis of toenails with pain - Nail plates x 10 were debrided thickness and length using sterile nail nippers without incident - Mechanical bur used to file down the nails - Labs with patient, LFT within normal limits - Will essentially be initiating 90-day course of Lamisil  this point.  # Neuropathic pain - Gabapentin  increase has been minimally helpful - Discussed spinal cord stimulation with patient - She is requesting second opinion as she feels her current back specialist has not offered significant intervention for her back pain or neuropathy - Referral placed to [facility], Dr. Katheryn Pandy   Follow-up in 9 weeks for [facility] and medication check  Murrel Freet L. Lunda Salines, AACFAS [facility]     [address] [place], [place] [address]                Office [phone]  Fax [phone]

## 2024-05-13 ENCOUNTER — Encounter: Payer: Self-pay | Admitting: Podiatry

## 2024-06-15 ENCOUNTER — Other Ambulatory Visit: Payer: Self-pay | Admitting: Obstetrics and Gynecology

## 2024-06-15 DIAGNOSIS — N644 Mastodynia: Secondary | ICD-10-CM

## 2024-06-20 ENCOUNTER — Ambulatory Visit
Admission: RE | Admit: 2024-06-20 | Discharge: 2024-06-20 | Disposition: A | Discharge status: Home / Self Care | Source: Ambulatory Visit | Attending: Obstetrics and Gynecology | Admitting: Obstetrics and Gynecology

## 2024-06-20 ENCOUNTER — Inpatient Hospital Stay
Admission: RE | Admit: 2024-06-20 | Discharge: 2024-06-20 | Discharge status: Home / Self Care | Source: Ambulatory Visit | Attending: Obstetrics and Gynecology | Admitting: Obstetrics and Gynecology

## 2024-06-20 ENCOUNTER — Encounter

## 2024-06-20 ENCOUNTER — Other Ambulatory Visit

## 2024-06-20 DIAGNOSIS — N644 Mastodynia: Secondary | ICD-10-CM

## 2024-06-26 ENCOUNTER — Encounter

## 2024-06-26 ENCOUNTER — Other Ambulatory Visit

## 2024-07-04 ENCOUNTER — Other Ambulatory Visit (INDEPENDENT_AMBULATORY_CARE_PROVIDER_SITE_OTHER)

## 2024-07-04 ENCOUNTER — Ambulatory Visit (INDEPENDENT_AMBULATORY_CARE_PROVIDER_SITE_OTHER): Admitting: Gastroenterology

## 2024-07-04 VITALS — BP 116/72 | HR 68 | Ht 64.0 in | Wt 258.0 lb

## 2024-07-04 DIAGNOSIS — Z860100 Personal history of colon polyps, unspecified: Secondary | ICD-10-CM | POA: Diagnosis not present

## 2024-07-04 DIAGNOSIS — R1010 Upper abdominal pain, unspecified: Secondary | ICD-10-CM

## 2024-07-04 DIAGNOSIS — K5909 Other constipation: Secondary | ICD-10-CM | POA: Diagnosis not present

## 2024-07-04 DIAGNOSIS — M6208 Separation of muscle (nontraumatic), other site: Secondary | ICD-10-CM

## 2024-07-04 LAB — CREATININE, SERUM: Creatinine, Ser: 0.78 mg/dL (ref 0.40–1.20)

## 2024-07-04 LAB — BUN: BUN: 15 mg/dL (ref 6–23)

## 2024-07-04 MED ORDER — IBSRELA 50 MG PO TABS
1.0000 | ORAL_TABLET | Freq: Two times a day (BID) | ORAL | 5 refills | Status: AC
Start: 2024-07-04 — End: ?

## 2024-07-04 NOTE — Progress Notes (Signed)
 HPI :  73 year old female here for follow-up visit for constipation and abdominal pain.  Recall for colonoscopy had another practice back in March, had a 12 mm polyp removed however the prep was poor.  We repeated a colonoscopy for her this past November, she had an additional 3 polyps removed, 2 of which were sessile serrated.  She also had hypertrophied anal papilla and internal hemorrhoids.  Biopsies of the papilla showed no evidence of AIN.  She had previously endorsed severe constipation leading to fecal impaction and ED visits.  She had failed Linzess  and Amitiza.  We gave her a trial of Ibsrela  50 mg twice daily.  She states this drug has been life-changing for her and allows her to have regular bowel movements and has kept her out of the ED in hospital.  She typically will take 1 tablet a day and she will have a bowel movement on average once every day to every other day.  If she does not go for 2 straight days she will take an additional dose.  She tries to only take it once a day and is concerned about her ability to form tolerance to this and hopes it will not stop working for her.  She denies any blood in the stool, can rarely have blood in the toilet paper when wiping herself attributed to hemorrhoids.  Generally she is doing much better in this regard  Her main concern is upper abdominal discomfort to mid abdominal discomfort.  She states this been going ongoing for many years attributed to her ventral hernia.  She states she has had a hard time losing weight due to her thyroid  condition.  She feels a sense of soreness and discomfort whenever she strains her abdomen and the hernia pops out it compresses her chest and makes it difficult for her to breathe.  She states again this has been ongoing for years but she feels like she wants to get it fixed and also is worried something else could be going on causing her pain.  She has not had cross-sectional imaging of her abdomen since 2018 or  so.    Colonoscopy- 02/13/23 - at [facility] Impression One 12 mm polyp in the hepatic flexure; performed cold snare removal Diverticulosis of mild severity Internal small (grade 1) hemorrhoids The prep was poor throughout, despite multiple lavages and suctioning.   Colonoscopy 11/08/23: - The perianal and digital rectal examinations were normal. - The colon was long and redundant. - A 3 to 4 mm polyp was found in the cecum. The polyp was flat. The polyp was removed with a cold snare. Resection and retrieval were complete. - Two flat polyps were found in the transverse colon. The polyps were 4 to 7 mm in size. These polyps were removed with a cold snare. Resection and retrieval were complete. - Anal papilla(e) were hypertrophied. Biopsies were taken with a cold forceps for histology to rule out AIN. - Internal hemorrhoids were found during retroflexion. - The exam was otherwise without abnormality.  FINAL DIAGNOSIS        1. Surgical [P], colon, cecum, polyp (1) :       - POLYPOID COLONIC MUCOSA WITH MILD HYPERPLASTIC CHANGES       - NEGATIVE FOR DYSPLASIA        2. Surgical [P], colon, transverse, polyp (2) :       - SESSILE SERRATED POLYP(S) WITHOUT CYTOLOGIC DYSPLASIA        3. Surgical [P], colon, hypertrophied  anal papilla biopsies :       - ANAL SQUAMOUS MUCOSA SHOWING NONSPECIFIC HYPERPLASTIC AND REACTIVE CHANGES       - NEGATIVE FOR DYSPLASIA OR MALIGNANCY    Past Medical History:  Diagnosis Date   Allergy    Arthritis    right arm   Breast cancer of upper-outer quadrant of right female breast (HCC) 04/24/2016   Colon polyps    Complication of anesthesia    states was hard to wake up after 1 c-section   Constipation    Graves' disease    Radiation treatment   Hyperlipidemia    Immature cataract    Lumbar herniated disc    Morbid obesity (HCC)    Nasal congestion 05/08/2016   Obesity    Osteoporosis    Shortness of breath dyspnea    with ADLs - no home O2   Sinus  problem    Sleep apnea    had sleep study, did not go back for CPAP fitting   Tachycardia    states due to Graves' disease     Past Surgical History:  Procedure Laterality Date   ABDOMINAL HYSTERECTOMY  1990   complete   BREAST BIOPSY     BREAST LUMPECTOMY Right    2017   BREAST LUMPECTOMY WITH RADIOACTIVE SEED AND SENTINEL LYMPH NODE BIOPSY Right 05/15/2016   Procedure: RIGHT BREAST LUMPECTOMY WITH RADIOACTIVE SEED AND SENTINEL LYMPH NODE BIOPSY;  Surgeon: Deward Null III, MD;  Location: [facility];  Service: General;  Laterality: Right;   CARDIAC CATHETERIZATION N/A 11/01/2015   Procedure: Right/Left Heart Cath and Coronary Angiography;  Surgeon: Ezra GORMAN Shuck, MD;  Location: [facility] INVASIVE CV LAB;  Service: Cardiovascular;  Laterality: N/A;   CESAREAN SECTION  8019,8011   x 2   COLONOSCOPY WITH PROPOFOL   06/12/2014   LAPAROSCOPIC APPENDECTOMY N/A 01/31/2014   Procedure: APPENDECTOMY LAPAROSCOPIC;  Surgeon: Vicenta DELENA Poli, MD;  Location: [facility];  Service: General;  Laterality: N/A;   NASAL SEPTUM SURGERY  1984   RECTOCELE REPAIR  09/01/2007   Family History  Problem Relation Age of Onset   Thyroid  disease Mother    Hypertension Mother    Arthritis Mother    Osteoporosis Mother    Heart attack Father 41       x3   Allergies Father    Arthritis Father    Heart disease Father    Prostate cancer Brother    Colon cancer Neg Hx    Esophageal cancer Neg Hx    Rectal cancer Neg Hx    Stomach cancer Neg Hx    Social History   Tobacco Use   Smoking status: Former    Current packs/day: 0.00    Types: Cigarettes    Start date: 05/14/1976    Quit date: 05/14/2016    Years since quitting: 8.1   Smokeless tobacco: Never   Tobacco comments:    5-6 cig./day  Vaping Use   Vaping status: Never Used  Substance Use Topics   Alcohol use: No    Alcohol/week: 0.0 standard drinks of alcohol   Drug use: No   Current Outpatient Medications  Medication Sig Dispense Refill    ascorbic acid (VITAMIN C) 500 MG tablet Take 500 mg by mouth daily.     aspirin  81 MG tablet Take 81 mg by mouth daily.     clindamycin (CLEOCIN T) 1 % lotion Apply topically.     cyclobenzaprine (FLEXERIL) 5 MG tablet Take  5 mg by mouth 3 (three) times daily as needed.     furosemide (LASIX) 20 MG tablet Take 20 mg by mouth as needed for fluid.     gabapentin  (NEURONTIN ) 300 MG capsule Take 1 capsule (300 mg total) by mouth 2 (two) times daily. 60 capsule 3   levothyroxine  (SYNTHROID ) 175 MCG tablet Take 175 mcg by mouth daily before breakfast.     losartan (COZAAR) 100 MG tablet Take 100 mg by mouth daily.     meclizine (ANTIVERT) 25 MG tablet Take 25 mg by mouth 3 (three) times daily as needed.     Multiple Vitamin (MULTIVITAMIN) tablet Take 1 tablet by mouth daily.     nitroGLYCERIN  (NITROSTAT ) 0.4 MG SL tablet Place 0.4 mg under the tongue every 5 (five) minutes as needed for chest pain.      Tenapanor HCl (IBSRELA ) 50 MG TABS Take 1 tablet by mouth in the morning and at bedtime. 60 tablet 3   Vitamin D, Ergocalciferol, (DRISDOL) 1.25 MG (50000 UNIT) CAPS capsule Take 50,000 Units by mouth every 7 (seven) days.     cyanocobalamin  (,VITAMIN B-12,) 1000 MCG/ML injection Inject 200 mcg into the muscle every 30 (thirty) days.  (Patient not taking: Reported on 07/04/2024)     No current facility-administered medications for this visit.   Allergies  Allergen Reactions   Codeine Rash     Review of Systems: All systems reviewed and negative except where noted in HPI.   Lab Results  Component Value Date   WBC 7.8 03/30/2024   HGB 14.7 03/30/2024   HCT 46.3 (H) 03/30/2024   MCV 91.0 03/30/2024   PLT 157 03/30/2024    Lab Results  Component Value Date   NA 139 01/03/2018   CL 100 01/03/2018   K 5.0 01/03/2018   CO2 26 01/03/2018   BUN 15 01/03/2018   CREATININE 0.97 01/03/2018   GFRNONAA 61 01/03/2018   CALCIUM  10.0 01/03/2018   ALBUMIN 4.0 03/16/2018   GLUCOSE 108 (H)  01/03/2018    Lab Results  Component Value Date   ALT 14 03/30/2024   AST 14 03/30/2024   ALKPHOS 82 03/16/2018   BILITOT 0.6 03/30/2024     Physical Exam: BP 116/72   Pulse 68   Ht 5' 4 (1.626 m)   Wt 258 lb (117 kg)   LMP  (LMP Unknown)   SpO2 95%   BMI 44.29 kg/m  Constitutional: Pleasant,well-developed, female in no acute distress. Abdominal: Soft, protuberant, large diastasis recti, There are no masses palpable. No hepatomegaly. Neurological: Alert and oriented to person place and time. Psychiatric: Normal mood and affect. Behavior is normal.   ASSESSMENT: 73 y.o. female here for assessment of the following  1. CONSTIPATION, CHRONIC   2. Pain of upper abdomen   3. Diastasis recti   4. History of colon polyps    As above, failed multiple regimens in the past.  Now on Ibsrela  and doing much better, has not developed any tolerance and very happy with the regimen.  We will continue it for now, take once to twice daily as needed.  If she finds this does not work as well for her over time she should let me know, take additional laxatives if she has not had a bowel movement in 2 days to prevent impaction for which she has had several in the past.  Colonoscopy showed no concerning findings.  She is due for surveillance colonoscopy 3 years from her last exam given she  had an advanced polyp removed last year.  Otherwise on exam it appears her symptoms correlate with very large diastases recti/ventral hernia.  This protrudes and causes sense of shortness of breath and discussed her/soreness.  She endorses it has been ongoing for years but she never had it fixed and becoming progressively worse.  She inquires about surgical repair.  I told her this would be a rather major operation for her, with her current weight there are risks for complications and wound healing etc.  I recommend cross-sectional imaging with a CT scan to further evaluate and make sure nothing else contributing to  her symptoms or process in this area.  If negative otherwise, she is requesting surgical consultation for hernia repair and would defer to surgeon about whether they would feel comfortable repairing this or not.  She understands and agrees with the plan.   PLAN: - continue IBSrela  - refill to use q day to BID - CT scan abdomen / pelvis - suspect symptoms related to diastasis recti which is large. Will await CT first to ensure no other pathology, if no other concerning pathology she is requesting surgical repair.  Can have her see a surgeon to discuss this option, as above, this will be a large operation for her with risks.  Marcey Naval, MD [facility]

## 2024-07-04 NOTE — Patient Instructions (Addendum)
 You have been scheduled for a CT scan of the abdomen and pelvis at [facility], 1st floor Radiology. The address can be found later in this AVS under upcoming appointment. You are scheduled on Wed, 7-30 at 3:30 pm. You should arrive 15 minutes prior to your appointment time for registration.   Plan on being there 30 to 60 minutes, depending on the type of exam you are having performed.  If you have any questions regarding your exam or if you need to reschedule, you may call [facility] Radiology Scheduling at [phone] between the hours of 8:00 am and 5:00 pm, Monday-Friday.   Please go to the lab in the basement of our building to have lab work done as you leave today. Hit B for basement when you get on the elevator.  When the doors open the lab is on your left.  We will call you with the results. Thank you.    We have sent the following medications to your pharmacy for you to pick up at your convenience: Continue IBSrela : Take twice daily  Thank you for entrusting me with your care and for choosing [facility], Dr. Elspeth Naval   If your blood pressure at your visit was 140/90 or greater, please contact your primary care physician to follow up on this. ______________________________________________________  If you are age 29 or older, your body mass index should be between 23-30. Your Body mass index is 44.29 kg/m. If this is out of the aforementioned range listed, please consider follow up with your Primary Care Provider.  If you are age 32 or younger, your body mass index should be between 19-25. Your Body mass index is 44.29 kg/m. If this is out of the aformentioned range listed, please consider follow up with your Primary Care Provider.  ________________________________________________________  The [facility] providers would like to encourage you to use MYCHART to communicate with providers for non-urgent requests or questions.  Due to long hold times on the  telephone, sending your provider a message by [facility] may be a faster and more efficient way to get a response.  Please allow 48 business hours for a response.  Please remember that this is for non-urgent requests.  _______________________________________________________  Due to recent changes in healthcare laws, you may see the results of your imaging and laboratory studies on MyChart before your provider has had a chance to review them.  We understand that in some cases there may be results that are confusing or concerning to you. Not all laboratory results come back in the same time frame and the provider may be waiting for multiple results in order to interpret others.  Please give us  48 hours in order for your provider to thoroughly review all the results before contacting the office for clarification of your results.

## 2024-07-12 ENCOUNTER — Ambulatory Visit (HOSPITAL_COMMUNITY)
Admission: RE | Admit: 2024-07-12 | Discharge: 2024-07-12 | Disposition: A | Discharge status: Home / Self Care | Source: Ambulatory Visit | Attending: Gastroenterology | Admitting: Gastroenterology

## 2024-07-12 DIAGNOSIS — R1010 Upper abdominal pain, unspecified: Secondary | ICD-10-CM | POA: Insufficient documentation

## 2024-07-12 DIAGNOSIS — M6208 Separation of muscle (nontraumatic), other site: Secondary | ICD-10-CM | POA: Insufficient documentation

## 2024-07-12 MED ORDER — IOHEXOL 300 MG/ML  SOLN
100.0000 mL | Freq: Once | INTRAMUSCULAR | Status: AC | PRN
  Administered 2024-07-12: 100 mL via INTRAVENOUS

## 2024-07-13 ENCOUNTER — Ambulatory Visit: Admitting: Podiatry

## 2024-07-14 ENCOUNTER — Ambulatory Visit (INDEPENDENT_AMBULATORY_CARE_PROVIDER_SITE_OTHER): Admitting: Podiatry

## 2024-07-14 ENCOUNTER — Encounter: Payer: Self-pay | Admitting: Podiatry

## 2024-07-14 DIAGNOSIS — M79675 Pain in left toe(s): Secondary | ICD-10-CM

## 2024-07-14 DIAGNOSIS — B351 Tinea unguium: Secondary | ICD-10-CM

## 2024-07-14 DIAGNOSIS — M79674 Pain in right toe(s): Secondary | ICD-10-CM | POA: Diagnosis not present

## 2024-07-14 DIAGNOSIS — M792 Neuralgia and neuritis, unspecified: Secondary | ICD-10-CM

## 2024-07-14 NOTE — Progress Notes (Signed)
 Chief Complaint  Patient presents with   RFC    RFC non diabetic toenail trim. 0 pain.     HPI: 73 y.o. female presents today for onychomycosis.  She is prescribed terbinafine  last visit, he states that she antimetabolites because she was unsure about labs.  Fortunately her liver function panel has been within normal limits.  Nails are thickened, morning, dystrophic and painful with direct dorsal palpation.  Patient is unable to maintain soft and tender thickness and due to mobility issues. Does see a back specialist, has been getting injections and not sure if it has been very helpful to this point.  Past Medical History:  Diagnosis Date   Allergy    Arthritis    right arm   Breast cancer of upper-outer quadrant of right female breast (HCC) 04/24/2016   Colon polyps    Complication of anesthesia    states was hard to wake up after 1 c-section   Constipation    Graves' disease    Radiation treatment   Hyperlipidemia    Immature cataract    Lumbar herniated disc    Morbid obesity (HCC)    Nasal congestion 05/08/2016   Obesity    Osteoporosis    Shortness of breath dyspnea    with ADLs - no home O2   Sinus problem    Sleep apnea    had sleep study, did not go back for CPAP fitting   Tachycardia    states due to Graves' disease    Past Surgical History:  Procedure Laterality Date   ABDOMINAL HYSTERECTOMY  1990   complete   BREAST BIOPSY     BREAST LUMPECTOMY Right    2017   BREAST LUMPECTOMY WITH RADIOACTIVE SEED AND SENTINEL LYMPH NODE BIOPSY Right 05/15/2016   Procedure: RIGHT BREAST LUMPECTOMY WITH RADIOACTIVE SEED AND SENTINEL LYMPH NODE BIOPSY;  Surgeon: Deward Null III, MD;  Location: [facility];  Service: General;  Laterality: Right;   CARDIAC CATHETERIZATION N/A 11/01/2015   Procedure: Right/Left Heart Cath and Coronary Angiography;  Surgeon: Ezra GORMAN Shuck, MD;  Location: [facility] INVASIVE CV LAB;  Service: Cardiovascular;  Laterality: N/A;    CESAREAN SECTION  8019,8011   x 2   COLONOSCOPY WITH PROPOFOL   06/12/2014   LAPAROSCOPIC APPENDECTOMY N/A 01/31/2014   Procedure: APPENDECTOMY LAPAROSCOPIC;  Surgeon: Vicenta DELENA Poli, MD;  Location: [facility];  Service: General;  Laterality: N/A;   NASAL SEPTUM SURGERY  1984   RECTOCELE REPAIR  09/01/2007    Allergies  Allergen Reactions   Codeine Rash    ROS    Physical Exam: There were no vitals filed for this visit.  General: The patient is alert and oriented x3 in no acute distress.  Dermatology: No plates 1 through 5 bilaterally are thickened, elongated, dystrophic with yellow discoloration and subungual debris.  Tender on direct dorsal palpation of nail plates k89  Vascular: Palpable pedal pulses bilaterally. Capillary refill within normal limits.  No appreciable edema.  No erythema or calor.  Neurological: Light touch sensation grossly intact bilateral feet.  Subjective neuropathy symptoms left foot greater than right.  Musculoskeletal Exam: No pedal deformities noted   Assessment/Plan of Care: 1. Pain due to onychomycosis of toenails of both feet   2. Neuropathic pain      No orders of the defined types were placed in this encounter.  None  Discussed clinical findings with patient today.  #Onychomycosis with pain  -Nails palliatively debrided  as below. -Educated on self-care  Procedure: Nail Debridement Rationale: Pain Type of Debridement: manual, sharp debridement. Instrumentation: Nail nipper, rotary burr. Number of Nails: 10   Follow-up in 9 weeks for RFC and medication check  Sua Spadafora L. Lamount MAUL, AACFAS [facility]     [address] [place], [place] [address]                Office [phone]  Fax [phone]

## 2024-07-21 ENCOUNTER — Ambulatory Visit: Payer: Self-pay | Admitting: Gastroenterology

## 2024-07-24 ENCOUNTER — Telehealth: Payer: Self-pay | Admitting: Gastroenterology

## 2024-07-24 DIAGNOSIS — M6208 Separation of muscle (nontraumatic), other site: Secondary | ICD-10-CM

## 2024-07-24 NOTE — Telephone Encounter (Signed)
 PT is calling to have nurse go over CT results. Please advise

## 2024-07-25 NOTE — Telephone Encounter (Signed)
 Inbound call from patient requesting a call regarding CT results. States she is unable to get into Mychart to see results. Please advise, thank you

## 2024-07-25 NOTE — Telephone Encounter (Signed)
 Pt made aware of Dr. Leigh recommendations.  Referral was sent to [facility] Dr. Ebbie along with pt records. Faxed to [phone] Pt made aware that their office will contact her and if she has not heard anything in 2 weeks then to give our office a call back so that we can check on the status of the referral. Pt verbalized understanding with all questions answered.

## 2024-07-25 NOTE — Telephone Encounter (Signed)
 Pt stated that she does not use my chart and requested the recent results from her CT scan. Chart was reviewed and noted the following documentation that was sent to pt via my chart.   Hello Ms. Purdue,   This message is to relay the results of your recent CT scan.    We do see that you have a diastasis recti which was noted on your abdominal exam in the office, but otherwise no concerning findings elsewhere. The diastasis recti is a weakening in your abdominal wall where the muscles come together. To have the diastasis recti fixed, would be a rather large operation for you. It is up to you in regards to how aggressive you would want to be with any treatment for this. Often, we recommend observation if it is not too bothersome. If you want to see a surgeon to discuss this further we as happy to coordinate this for you, please us  us  know if you want to pursue this. However, please keep in mind this would be a big undertaking and often surgeons may have some reservation about fixing this based on several factors. Let me know if you have any questions about this otherwise.   Dr. Leigh  This was read aloud to the pt. Pt did state that she is having abdominal pain and would like to purse the referral. Pt stated that Dr. Leigh had mentioned a surgeons name that maybe able to help her.  Please advise on Surgeon name and referral.

## 2024-07-25 NOTE — Telephone Encounter (Signed)
 Okay can refer to Dr. Adina Bury of [facility] surgery - for evaluation of diastasis recti. Thanks

## 2024-07-26 NOTE — Telephone Encounter (Signed)
 I spoke with Dr. Ebbie about her case. Unfortunately [facility] general surgery does not operate on diastasis recti, she would need her referral changed to plastic surgery. He also mentioned that diastasis recti repair is often not covered well by insurance. She needs to know this, but if she wants a surgical opinion can you please refer her to plastic surgery at [facility] and cancel consultation to Dr. Ebbie at [facility]. Thanks

## 2024-07-26 NOTE — Telephone Encounter (Signed)
 I have spoken to patient to advise that following conversation between Dr Leigh and Dr Tanda, general surgeon with [facility], it was determined that general surgery would not perform surgery for diasthesis recti repair, rather plastic surgery should be consulted. She is advised that he also made mention that often, diastasis recti repair is not covered well by insurance. However, if she is still interested in a surgical opinion, we can send a referral over to plastic surgery. Patient states that she is very uncomfortable and would like to be referred to plastics. Ambulatory referral to [facility] has been placed in EPIC.

## 2024-07-26 NOTE — Addendum Note (Signed)
 Addended by: CLAUDENE NAOMIE SAILOR on: 07/26/2024 09:57 AM   Modules accepted: Orders

## 2024-07-28 NOTE — Telephone Encounter (Signed)
 Inbound call from patient requesting a call regarding referrals. States she has not heard anything. Please advise, thank you

## 2024-07-28 NOTE — Telephone Encounter (Signed)
 Spoke to patient to advise that a referral has been made to plastic surgery as of 07/26/24. It may be 1-2 weeks before she receives a call from their office but should hear from them soon. She verbalizes understanding and states that she just wanted to make sure she wasn't supposed to be calling them.

## 2024-08-16 ENCOUNTER — Ambulatory Visit (INDEPENDENT_AMBULATORY_CARE_PROVIDER_SITE_OTHER): Payer: Self-pay

## 2024-08-16 DIAGNOSIS — K529 Noninfective gastroenteritis and colitis, unspecified: Secondary | ICD-10-CM | POA: Insufficient documentation

## 2024-08-16 DIAGNOSIS — M6208 Separation of muscle (nontraumatic), other site: Secondary | ICD-10-CM | POA: Insufficient documentation

## 2024-08-16 NOTE — Progress Notes (Signed)
 New Patient Office Visit  Subjective    Patient ID: DAYSI BOGGAN, female    DOB: 1951/08/07  Age: 73 y.o. MRN: [id number]  CC:  Chief Complaint  Patient presents with   Advice Only    HPI MARG MACMASTER presents to for repair of diastasis recti.  She has a history of chronic constipation and abdominal pain. Colonoscopy in March this year showed a polyp that was removed and in November due to poor prep had another one that showed 2 polyps. She had previous episodes of fecal impaction that granted ED visits.   She has been prescribed medications that per patient have been life changing and allow her to have regular bowel movements. Her abdominal discomfort is generalized and has been ongoing for many years.   CT scan abdomen and pelvis showed no abnormalities yet an incidental finding of diastasis recti. Patient comes for recommendations regarding management of diastasis recti as she was told it would fix her dysmotility issues.    Outpatient Encounter Medications as of 08/16/2024  Medication Sig   ascorbic acid (VITAMIN C) 500 MG tablet Take 500 mg by mouth daily.   aspirin  81 MG tablet Take 81 mg by mouth daily.   clindamycin (CLEOCIN T) 1 % lotion Apply topically.   cyanocobalamin  (,VITAMIN B-12,) 1000 MCG/ML injection Inject 200 mcg into the muscle every 30 (thirty) days.    cyclobenzaprine (FLEXERIL) 5 MG tablet Take 5 mg by mouth 3 (three) times daily as needed.   furosemide (LASIX) 20 MG tablet Take 20 mg by mouth as needed for fluid.   levothyroxine  (SYNTHROID ) 175 MCG tablet Take 175 mcg by mouth daily before breakfast.   losartan (COZAAR) 100 MG tablet Take 100 mg by mouth daily.   Multiple Vitamin (MULTIVITAMIN) tablet Take 1 tablet by mouth daily.   nitroGLYCERIN  (NITROSTAT ) 0.4 MG SL tablet Place 0.4 mg under the tongue every 5 (five) minutes as needed for chest pain.    Tenapanor HCl (IBSRELA ) 50 MG TABS Take 1 tablet by mouth 2 (two) times daily.   Vitamin D,  Ergocalciferol, (DRISDOL) 1.25 MG (50000 UNIT) CAPS capsule Take 50,000 Units by mouth every 7 (seven) days.   gabapentin  (NEURONTIN ) 300 MG capsule Take 1 capsule (300 mg total) by mouth 2 (two) times daily. (Patient not taking: Reported on 08/16/2024)   meclizine (ANTIVERT) 25 MG tablet Take 25 mg by mouth 3 (three) times daily as needed. (Patient not taking: Reported on 08/16/2024)   No facility-administered encounter medications on file as of 08/16/2024.    Past Medical History:  Diagnosis Date   Allergy    Arthritis    right arm   Breast cancer of upper-outer quadrant of right female breast (HCC) 04/24/2016   Colon polyps    Complication of anesthesia    states was hard to wake up after 1 c-section   Constipation    Graves' disease    Radiation treatment   Hyperlipidemia    Immature cataract    Lumbar herniated disc    Morbid obesity (HCC)    Nasal congestion 05/08/2016   Obesity    Osteoporosis    Shortness of breath dyspnea    with ADLs - no home O2   Sinus problem    Sleep apnea    had sleep study, did not go back for CPAP fitting   Tachycardia    states due to Graves' disease    Past Surgical History:  Procedure Laterality Date   ABDOMINAL  HYSTERECTOMY  1990   complete   BREAST BIOPSY     BREAST LUMPECTOMY Right    2017   BREAST LUMPECTOMY WITH RADIOACTIVE SEED AND SENTINEL LYMPH NODE BIOPSY Right 05/15/2016   Procedure: RIGHT BREAST LUMPECTOMY WITH RADIOACTIVE SEED AND SENTINEL LYMPH NODE BIOPSY;  Surgeon: Deward Null III, MD;  Location: [facility];  Service: General;  Laterality: Right;   CARDIAC CATHETERIZATION N/A 11/01/2015   Procedure: Right/Left Heart Cath and Coronary Angiography;  Surgeon: Ezra GORMAN Shuck, MD;  Location: [facility] INVASIVE CV LAB;  Service: Cardiovascular;  Laterality: N/A;   CESAREAN SECTION  [id number]   x 2   COLONOSCOPY WITH PROPOFOL   06/12/2014   LAPAROSCOPIC APPENDECTOMY N/A 01/31/2014   Procedure: APPENDECTOMY LAPAROSCOPIC;   Surgeon: Vicenta DELENA Poli, MD;  Location: [facility];  Service: General;  Laterality: N/A;   NASAL SEPTUM SURGERY  1984   RECTOCELE REPAIR  09/01/2007    Family History  Problem Relation Age of Onset   Thyroid  disease Mother    Hypertension Mother    Arthritis Mother    Osteoporosis Mother    Heart attack Father 97       x3   Allergies Father    Arthritis Father    Heart disease Father    Prostate cancer Brother    Colon cancer Neg Hx    Esophageal cancer Neg Hx    Rectal cancer Neg Hx    Stomach cancer Neg Hx     Social History   Socioeconomic History   Marital status: Widowed    Spouse name: Not on file   Number of children: 2   Years of education: 14   Highest education level: Not on file  Occupational History   Occupation: unemployed    Employer: UNEMPLOYED  Tobacco Use   Smoking status: Former    Current packs/day: 0.00    Types: Cigarettes    Start date: 05/14/1976    Quit date: 05/14/2016    Years since quitting: 8.2   Smokeless tobacco: Never   Tobacco comments:    5-6 cig./day  Vaping Use   Vaping status: Never Used  Substance and Sexual Activity   Alcohol use: No    Alcohol/week: 0.0 standard drinks of alcohol   Drug use: No   Sexual activity: Yes    Birth control/protection: Surgical  Other Topics Concern   Not on file  Social History Narrative   Caffeine Use - 1 cup coffee/day   Patient is right handed.      Social Drivers of Corporate investment banker Strain: Low Risk  (02/07/2024)   Received from [facility]   Overall Financial Resource Strain (CARDIA)    Difficulty of Paying Living Expenses: Not hard at all  Food Insecurity: Low Risk  (07/18/2024)   Received from [facility]   Hunger Vital Sign    Within the past 12 months, you worried that your food would run out before you got money to buy more: Never true    Within the past 12 months, the food you bought just didn't last and you didn't have money to get more. : Never true   Transportation Needs: No Transportation Needs (07/18/2024)   Received from [facility]    In the past 12 months, has lack of reliable transportation kept you from medical appointments, meetings, work or from getting things needed for daily living? : No  Physical Activity: Not on file  Stress: Not on file  Social Connections: Unknown (04/17/2022)   Received from [facility]   Social Network    Social Network: Not on file  Intimate Partner Violence: Unknown (03/16/2022)   Received from [facility]   HITS    Physically Hurt: Not on file    Insult or Talk Down To: Not on file    Threaten Physical Harm: Not on file    Scream or Curse: Not on file    ROS ROS negative except as noted in HPI      Objective    BP 121/79 (BP Location: Left Arm, Patient Position: Sitting, Cuff Size: Large)   Pulse 82   Ht 5' 4.5 (1.638 m)   Wt 258 lb 3.2 oz (117.1 kg)   LMP  (LMP Unknown)   SpO2 96%   BMI 43.64 kg/m   Physical Exam (MA as chaperone) Constitutional: Pleasant,well-developed, female in no acute distress. Neurological: Alert and oriented to person place and time. Psychiatric: Normal mood and affect. Behavior is normal.   CT ABD PEL 7/30 IMPRESSION: 1. Small hiatal hernia. 2. Diastasis recti with no definite ventral wall hernia. 3. No acute intra-abdominal or intrapelvic abnormality.  Assessment & Plan:  LAKESIA DAHLE presents to for repair of diastasis recti and chronic constipation and abdominal pain.  I explain to the patient that I do perform diastasis recti repair and that this is a surgical procedure designed to close the separation between the abdominal muscles. However, this operation primarily addresses the cosmetic and structural appearance of the abdomen. It does not target or improve underlying gastrointestinal motility disorders, which affect the movement of the digestive tract. Additionally, repairing the abdominal wall can increase  intra-abdominal pressure. This increase may exacerbate symptoms like abdominal pain or discomfort, especially in patients with existing motility issues. Therefore, while diastasis recti repair might improve the physical contour of the abdomen, it is unlikely to relieve, and could potentially worsen her digestive symptoms.  Therefore I recommended the following: - No surgical intervention for diastasis recti.  - Continue Dr. Leigh recommendations regarding chronic constipation management - Healthy weight and wellness to help with her weigh loss plan and to get strategies to strengthen her core.  - Referral to colorectal surgery to rule out colonic dysmotility issues and discuss diagnostic laparoscopy.  - Patient can follow up with me PRN - Patient expressed understanding and agrees with the plan.   Maeghan Canny M. Irfan Veal, MD [facility]

## 2024-08-21 ENCOUNTER — Institutional Professional Consult (permissible substitution)

## 2024-08-24 ENCOUNTER — Ambulatory Visit

## 2024-08-24 VITALS — BP 121/75 | HR 79 | Temp 97.9°F | Resp 18 | Ht 64.5 in | Wt 257.2 lb

## 2024-08-24 DIAGNOSIS — M810 Age-related osteoporosis without current pathological fracture: Secondary | ICD-10-CM | POA: Diagnosis not present

## 2024-08-24 MED ORDER — DENOSUMAB 60 MG/ML ~~LOC~~ SOSY
60.0000 mg | PREFILLED_SYRINGE | Freq: Once | SUBCUTANEOUS | Status: AC
  Administered 2024-08-24: 60 mg via SUBCUTANEOUS
  Filled 2024-08-24: qty 1

## 2024-08-24 NOTE — Progress Notes (Signed)
 Diagnosis: Osteoporosis  Provider:  Chilton Greathouse MD  Procedure: Injection  Prolia (Denosumab), Dose: 60 mg, Site: subcutaneous, Number of injections: 1  Injection Site(s): Right arm  Post Care: Patient declined observation  Discharge: Condition: Good, Destination: Home . AVS Provided  Performed by:  Loney Hering, LPN

## 2024-09-15 ENCOUNTER — Ambulatory Visit (INDEPENDENT_AMBULATORY_CARE_PROVIDER_SITE_OTHER): Admitting: Podiatry

## 2024-09-15 DIAGNOSIS — M5416 Radiculopathy, lumbar region: Secondary | ICD-10-CM

## 2024-09-15 DIAGNOSIS — M79674 Pain in right toe(s): Secondary | ICD-10-CM

## 2024-09-15 DIAGNOSIS — B351 Tinea unguium: Secondary | ICD-10-CM | POA: Diagnosis not present

## 2024-09-15 DIAGNOSIS — M79675 Pain in left toe(s): Secondary | ICD-10-CM

## 2024-09-15 NOTE — Progress Notes (Signed)
  Subjective:  Patient ID: Sophia Kelley, female    DOB: July 04, 1951,  MRN: [id number]  Chief Complaint  Patient presents with   RFC    RFC 1 callus.  Not diabetic. no anti coag    73 y.o. female presents with the above complaint. History confirmed with patient. Patient presenting with pain related to dystrophic thickened elongated nails. Patient is unable to trim own nails related to nail dystrophy and mobility issues. Patient does not have a history of T2DM.  Does have decreased mobility and neuropathy pain secondary to lumbar radiculopathy.  Objective:  Physical Exam: warm, good capillary refill nail exam onychomycosis of the toenails, onycholysis, dystrophic nails, and nail plates greater than 3 mm thickening DP pulses palpable, PT pulses palpable, epicritic sensation to light touch intact, and vibratory sensation diminished, subjective neuropathy symptoms left foot greater than right Left Foot:  Pain with palpation of nails due to elongation and dystrophic growth.  Right Foot: Pain with palpation of nails due to elongation and dystrophic growth.   Assessment:   1. Pain due to onychomycosis of toenails of both feet   2. Lumbar radiculopathy      Plan:  Patient was evaluated and treated and all questions answered.  #Onychomycosis with pain  -Nails palliatively debrided as below. -Educated on self-care  Procedure: Nail Debridement Rationale: Pain Type of Debridement: manual, sharp debridement. Instrumentation: Nail nipper, rotary burr. Number of Nails: 9  # Neuropathic symptoms secondary to lumbar radiculopathy - Currently being managed by pain management  Return in about 9 weeks (around 11/17/2024) for Routine Foot Care.         Ethan Saddler, DPM [facility] / [facility]

## 2024-09-18 ENCOUNTER — Encounter: Payer: Self-pay | Admitting: Podiatry

## 2024-11-16 ENCOUNTER — Ambulatory Visit: Admitting: Podiatry

## 2024-11-17 ENCOUNTER — Telehealth: Payer: Self-pay | Admitting: Gastroenterology

## 2024-11-17 ENCOUNTER — Ambulatory Visit: Admitting: Podiatry

## 2024-11-17 NOTE — Telephone Encounter (Signed)
 Inbound call from patient stating that she is requesting to schedule with Dr. Leigh only due to her wanting to show him something. Patient stated it is urgent she gets seen by him. Patient is requesting a call back from the nurse. Please advise.

## 2024-11-17 NOTE — Telephone Encounter (Signed)
 Patient calls wanting to make an appointment with Dr Leigh only for him to take a look at her abdomen and lump on her thigh.   Patient has diastasis recti which she was referred to cosmetic surgery for and surgery was deferred. Patient states that this area of muscle separation has worsened significantly and she is having more and more problems with constipation which she relates to the diastasis recti. States her body has become immune to IBSrela . Also states that her quality of life is very much negatively impacted by her symptoms and she just wants Dr Leigh to re-evaluate all her symptoms and do something to help.  Patient is scheduled for Dr Hassan next availability 12/28/24 at 3 pm and she verbalizes understanding.

## 2024-11-21 ENCOUNTER — Ambulatory Visit: Admitting: Podiatry

## 2024-11-23 ENCOUNTER — Ambulatory Visit: Admitting: Podiatry

## 2024-11-23 DIAGNOSIS — B351 Tinea unguium: Secondary | ICD-10-CM | POA: Diagnosis not present

## 2024-11-23 DIAGNOSIS — M79674 Pain in right toe(s): Secondary | ICD-10-CM

## 2024-11-23 DIAGNOSIS — M792 Neuralgia and neuritis, unspecified: Secondary | ICD-10-CM

## 2024-11-23 DIAGNOSIS — M79675 Pain in left toe(s): Secondary | ICD-10-CM

## 2024-11-23 DIAGNOSIS — M5416 Radiculopathy, lumbar region: Secondary | ICD-10-CM

## 2024-11-23 MED ORDER — CICLOPIROX 8 % EX SOLN: Freq: Every day | CUTANEOUS | 12 refills | Status: AC

## 2024-11-23 NOTE — Patient Instructions (Addendum)
 Look for urea  40-47% nail gel, cream or ointment and apply to the thickened toenails. This can be bought over the counter, at a pharmacy or online such as [employer].  This can help improve the penetration of the prescription Penlac.  Apply the Penlac and a thin layer covering the entire surface of the nail and nail edges each night before bedtime.  Once a week you will need to wash off with rubbing alcohol or nail polish remover.

## 2024-11-23 NOTE — Progress Notes (Signed)
°  Subjective:  Patient ID: Sophia Kelley, female    DOB: 1950/12/31,  MRN: [id number]  Chief Complaint  Patient presents with   Nail Problem    RFC.   L great toe nail medial side is very sore.  Red and swollen no drainage.  Not Diabetic. 81 mg Asprin    73 y.o. female presents with the above complaint. History confirmed with patient. Patient presenting with pain related to dystrophic thickened elongated nails. Patient is unable to trim own nails related to nail dystrophy and mobility issues. Patient does not have a history of T2DM.  Does have decreased mobility and neuropathy pain secondary to lumbar radiculopathy.  Left first toenail specially painful today.  Objective:  Physical Exam: warm, good capillary refill nail exam onychomycosis of the toenails, onycholysis, dystrophic nails, and nail plates greater than 3 mm thickening DP pulses palpable, PT pulses palpable, epicritic sensation to light touch intact, and vibratory sensation diminished, subjective neuropathy symptoms left foot greater than right Left Foot:  Pain with palpation of nails due to elongation and dystrophic growth.  Mild incurvation of left hallux nail border. Right Foot: Pain with palpation of nails due to elongation and dystrophic growth.   Assessment:   1. Pain due to onychomycosis of toenails of both feet   2. Neuropathic pain   3. Lumbar radiculopathy      Plan:  Patient was evaluated and treated and all questions answered.  #Onychomycosis with pain  -Nails palliatively debrided as below. -Educated on self-care - Slant back nail trim left hallux medial border without incident - Wanting discuss treatment of onychomycosis.  Has had previous treatments to medication, has not cleared out, sending in prescription for topical Penlac  to be applied nightly and wash off once a week with rubbing alcohol or nail for shorter period  Procedure: Nail Debridement Rationale: Pain Type of Debridement: manual, sharp  debridement. Instrumentation: Nail nipper, rotary burr. Number of Nails: 10   # Neuropathic symptoms secondary to lumbar radiculopathy - Currently being managed by pain management  Return in about 9 weeks (around 01/25/2025) for Routine Foot Care.         Ethan Saddler, DPM [facility] / [facility]

## 2024-11-24 ENCOUNTER — Telehealth: Payer: Self-pay

## 2024-11-24 NOTE — Telephone Encounter (Signed)
 PA request received from [facility] for Ciclopirox 8% solution. PA submitted through CoverMyMeds and waiting on response.  Nyra Rowen  (Key: [id number])

## 2024-11-27 NOTE — Telephone Encounter (Signed)
 PA was denied

## 2024-12-14 ENCOUNTER — Telehealth: Payer: Self-pay

## 2024-12-14 NOTE — Telephone Encounter (Signed)
 Auth Submission: NO AUTH NEEDED Site of care: Site of care: [facility] Payer: Medicare A/B with supplement Medication & CPT/J Code(s) submitted: Prolia  (Denosumab ) [id number] Diagnosis Code:  Route of submission (phone, fax, portal):  Phone # Fax # Auth type: Buy/Bill PB Units/visits requested: 60mg  x 2 doses Reference number:  Approval from: 12/14/24 to 01/13/26

## 2024-12-27 NOTE — Progress Notes (Signed)
 "  HPI :  74 year old female here for follow-up visit for constipation, diastases recti, and has questions about bumps on her legs today.  Recall she has a history of colon polyps as outlined below, required 2 colonoscopies in recent years due to limited prep on her first exam.  Due to her history of polyps she is due for colonoscopy later this year.  She has been followed for chronic constipation, she has had fecal impactions and ED visits in the past.  She failed Linzess  and Amitiza.  In recent years have had her on Ibsrela  50 mg once to twice daily.  She states this has worked better than anything she has been on in the past and has kept her out of the ED and in the hospital.  Generally she has been pretty happy with this.  She wonders if she is losing her sensitivity to it as it may not be working as well as it used to.  She still can go to the bathroom every day but sometimes it is getting harder to do so.  She has been taking it only once daily.  She is not taking anything else for her bowels.  No blood in her stools.  She has a large diastases recti in the setting of obesity with a BMI of 43.  She had a CT scan to evaluate this as outlined below.  She really wanted to see a careers adviser and was seen by a plastic surgeon in recent year or so.  He recommended significant weight loss prior to considering surgery.  She states she is working on this through dieting but has not seen good results and having a really hard time losing significant weight.  She has not seen a weight loss specialist or consider to GLP-1 agonist yet.  She otherwise inquires about some painful bumps on her left thigh that she noticed and wonders if she has an inguinal hernia.  She asked me to evaluate that today as well.    Colonoscopy- 02/13/23 - at [facility] Impression One 12 mm polyp in the hepatic flexure; performed cold snare removal Diverticulosis of mild severity Internal small (grade 1) hemorrhoids The prep was poor  throughout, despite multiple lavages and suctioning.     Colonoscopy 11/08/23: - The perianal and digital rectal examinations were normal. - The colon was long and redundant. - A 3 to 4 mm polyp was found in the cecum. The polyp was flat. The polyp was removed with a cold snare. Resection and retrieval were complete. - Two flat polyps were found in the transverse colon. The polyps were 4 to 7 mm in size. These polyps were removed with a cold snare. Resection and retrieval were complete. - Anal papilla(e) were hypertrophied. Biopsies were taken with a cold forceps for histology to rule out AIN. - Internal hemorrhoids were found during retroflexion. - The exam was otherwise without abnormality.   FINAL DIAGNOSIS        1. Surgical [P], colon, cecum, polyp (1) :       - POLYPOID COLONIC MUCOSA WITH MILD HYPERPLASTIC CHANGES       - NEGATIVE FOR DYSPLASIA        2. Surgical [P], colon, transverse, polyp (2) :       - SESSILE SERRATED POLYP(S) WITHOUT CYTOLOGIC DYSPLASIA        3. Surgical [P], colon, hypertrophied anal papilla biopsies :       - ANAL SQUAMOUS MUCOSA SHOWING NONSPECIFIC HYPERPLASTIC AND REACTIVE CHANGES       -  NEGATIVE FOR DYSPLASIA OR MALIGNANCY     CT abdomen / pelvis 07/12/24: IMPRESSION: 1. Small hiatal hernia. 2. Diastasis recti with no definite ventral wall hernia. 3. No acute intra-abdominal or intrapelvic abnormality.   Past Medical History:  Diagnosis Date   Allergy    Arthritis    right arm   Breast cancer of upper-outer quadrant of right female breast (HCC) 04/24/2016   Colon polyps    Complication of anesthesia    states was hard to wake up after 1 c-section   Constipation    Graves' disease    Radiation treatment   Hyperlipidemia    Immature cataract    Lumbar herniated disc    Morbid obesity (HCC)    Nasal congestion 05/08/2016   Obesity    Osteoporosis    Prediabetes    Shortness of breath dyspnea    with ADLs - no home O2   Sinus problem     Sleep apnea    had sleep study, did not go back for CPAP fitting   Tachycardia    states due to Graves' disease     Past Surgical History:  Procedure Laterality Date   ABDOMINAL HYSTERECTOMY  1990   complete   BREAST BIOPSY     BREAST LUMPECTOMY Right    2017   BREAST LUMPECTOMY WITH RADIOACTIVE SEED AND SENTINEL LYMPH NODE BIOPSY Right 05/15/2016   Procedure: RIGHT BREAST LUMPECTOMY WITH RADIOACTIVE SEED AND SENTINEL LYMPH NODE BIOPSY;  Surgeon: Deward Null III, MD;  Location: [facility];  Service: General;  Laterality: Right;   CARDIAC CATHETERIZATION N/A 11/01/2015   Procedure: Right/Left Heart Cath and Coronary Angiography;  Surgeon: Ezra GORMAN Shuck, MD;  Location: [facility] INVASIVE CV [facility];  Service: Cardiovascular;  Laterality: N/A;   CESAREAN SECTION  [id number],8011   x 2   COLONOSCOPY WITH PROPOFOL   06/12/2014   LAPAROSCOPIC APPENDECTOMY N/A 01/31/2014   Procedure: APPENDECTOMY LAPAROSCOPIC;  Surgeon: Vicenta DELENA Poli, MD;  Location: [facility];  Service: General;  Laterality: N/A;   NASAL SEPTUM SURGERY  1984   RECTOCELE REPAIR  09/01/2007   Family History  Problem Relation Age of Onset   Thyroid  disease Mother    Hypertension Mother    Arthritis Mother    Osteoporosis Mother    Heart attack Father 68       x3   Allergies Father    Arthritis Father    Heart disease Father    Prostate cancer Brother    Colon cancer Neg Hx    Esophageal cancer Neg Hx    Rectal cancer Neg Hx    Stomach cancer Neg Hx    Social History[1] Current Outpatient Medications  Medication Sig Dispense Refill   ascorbic acid (VITAMIN C) 500 MG tablet Take 500 mg by mouth daily.     aspirin  81 MG tablet Take 81 mg by mouth daily.     ciclopirox  (PENLAC ) 8 % solution Apply topically at bedtime. Apply over nail and surrounding skin. Apply daily over previous coat. After seven (7) days, may remove with alcohol and continue cycle. 6.6 mL 12   clindamycin (CLEOCIN T) 1 % lotion Apply topically.      clotrimazole-betamethasone  (LOTRISONE) cream Apply 1 Application topically 2 (two) times daily.     cyanocobalamin  (,VITAMIN B-12,) 1000 MCG/ML injection Inject 200 mcg into the muscle every 30 (thirty) days.      cyclobenzaprine (FLEXERIL) 5 MG tablet Take 5 mg by mouth 3 (three) times daily as  needed.     denosumab  (PROLIA ) 60 MG/ML SOSY injection Inject 60 mg into the skin every 6 (six) months.     furosemide (LASIX) 20 MG tablet Take 20 mg by mouth as needed for fluid.     levothyroxine  (SYNTHROID ) 175 MCG tablet Take 175 mcg by mouth daily before breakfast.     losartan (COZAAR) 100 MG tablet Take 100 mg by mouth daily.     Multiple Vitamin (MULTIVITAMIN) tablet Take 1 tablet by mouth daily.     mupirocin  ointment (BACTROBAN ) 2 % Apply 1 Application topically 3 (three) times daily.     nitroGLYCERIN  (NITROSTAT ) 0.4 MG SL tablet Place 0.4 mg under the tongue every 5 (five) minutes as needed for chest pain.      nystatin cream (MYCOSTATIN) Apply 1 Application topically 2 (two) times daily.     Tenapanor HCl (IBSRELA ) 50 MG TABS Take 1 tablet by mouth 2 (two) times daily. (Patient taking differently: Take 1 tablet by mouth 2 (two) times daily. Taking once daily) 60 tablet 5   Vitamin D, Ergocalciferol, (DRISDOL) 1.25 MG (50000 UNIT) CAPS capsule Take 50,000 Units by mouth every 7 (seven) days.     No current facility-administered medications for this visit.   Allergies[2]   Review of Systems: All systems reviewed and negative except where noted in HPI.    No results found.  Physical Exam: BP 130/70   Pulse 67   Ht 5' 4.5 (1.638 m)   Wt 256 lb (116.1 kg)   LMP  (LMP Unknown)   BMI 43.26 kg/m  Constitutional: Pleasant,well-developed, female in no acute distress. Extremities: suspected lipomas of the mid thigh / groin, mild TTP - no fluctuance or erythema. CMA Madison Favre as standby Neurological: Alert and oriented to person place and time. Psychiatric: Normal mood and affect.  Behavior is normal.   ASSESSMENT: 74 y.o. female here for assessment of the following  1. Chronic idiopathic constipation   2. Morbid obesity (HCC)   3. Diastasis recti   4. Lipoma of left lower extremity    Severe constipation with multiple ED visits and impactions in the past.  She has failed numerous regimens including Linzess , Amitiza, over-the-counter regimens.  Fortunately Ibsrela  has worked much better for her than other options and still does okay on this but concerned about losing response.  I encouraged her to use it more frequently if she needs to, can use it 50 mg twice daily.  She can also add MiraLAX  to that once to twice daily as needed.  If she finds that she is using it more frequently and adding MiraLAX  and is not helping, she should contact me for reassessment.  We discussed her diastases recti.  I agree she is not a good surgical candidate right now with her current weight.  She is having a very difficult time losing weight on her own.  Recommend referral to the [facility], strong consideration for GLP-1 agonist however caution this can make her constipation worse and she understands that.  Will await referral to [facility].  On exam today concerned she may have some lipomas on her left thigh, soft subcutaneous nodules, although seem tender to the touch.  I recommend she touch base with her plastic surgeon/surgeon about this issue, a bit outside of my scope of practice.  She understands and agrees  PLAN: - continue IBSRela  50mg  BID - add Miralax  PRN - refer to [facility] -  consider GLP1 however could make constipation worse - follow up with plastic surgeon about subcutaneous nodules on leg  Marcey Naval, MD [facility]     [1]  Social History Tobacco Use   Smoking status: Former    Current packs/day: 0.00    Types: Cigarettes    Start date: 05/14/1976    Quit date: 05/14/2016    Years  since quitting: 8.6   Smokeless tobacco: Never   Tobacco comments:    5-6 cig./day  Vaping Use   Vaping status: Never Used  Substance Use Topics   Alcohol use: No    Alcohol/week: 0.0 standard drinks of alcohol   Drug use: No  [2]  Allergies Allergen Reactions   Codeine Rash   "

## 2024-12-28 ENCOUNTER — Encounter (INDEPENDENT_AMBULATORY_CARE_PROVIDER_SITE_OTHER): Payer: Self-pay

## 2024-12-28 ENCOUNTER — Encounter: Payer: Self-pay | Admitting: Gastroenterology

## 2024-12-28 ENCOUNTER — Ambulatory Visit: Admitting: Gastroenterology

## 2024-12-28 VITALS — BP 130/70 | HR 67 | Ht 64.5 in | Wt 256.0 lb

## 2024-12-28 DIAGNOSIS — M6208 Separation of muscle (nontraumatic), other site: Secondary | ICD-10-CM

## 2024-12-28 DIAGNOSIS — Z6841 Body Mass Index (BMI) 40.0 and over, adult: Secondary | ICD-10-CM | POA: Diagnosis not present

## 2024-12-28 DIAGNOSIS — D1724 Benign lipomatous neoplasm of skin and subcutaneous tissue of left leg: Secondary | ICD-10-CM

## 2024-12-28 DIAGNOSIS — K5904 Chronic idiopathic constipation: Secondary | ICD-10-CM

## 2024-12-28 MED ORDER — POLYETHYLENE GLYCOL 3350 17 G PO PACK: 17.0000 g | PACK | Freq: Every day | ORAL | Status: AC | PRN

## 2024-12-28 NOTE — Patient Instructions (Signed)
 Continue IBSrela .  Please purchase the following medications over the counter and take as directed: Miralax : Take as needed  We are referring you to [facility].  They will contact you directly to schedule an appointment.  It may take a week or more before you hear from them.  Please feel free to contact us  if you have not heard from them within 2 weeks and we will follow up on the referral.   Please follow up with the plastic surgeon regarding painful nodules on your legs.  Thank you for entrusting me with your care and for choosing [facility], Dr. Elspeth Naval    _______________________________________________________  If your blood pressure at your visit was 140/90 or greater, please contact your primary care physician to follow up on this.  _______________________________________________________  If you are age 20 or older, your body mass index should be between 23-30. Your Body mass index is 43.26 kg/m. If this is out of the aforementioned range listed, please consider follow up with your Primary Care Provider.  If you are age 23 or younger, your body mass index should be between 19-25. Your Body mass index is 43.26 kg/m. If this is out of the aformentioned range listed, please consider follow up with your Primary Care Provider.   ________________________________________________________  The [facility] providers would like to encourage you to use MYCHART to communicate with providers for non-urgent requests or questions.  Due to long hold times on the telephone, sending your provider a message by [facility] may be a faster and more efficient way to get a response.  Please allow 48 business hours for a response.  Please remember that this is for non-urgent requests.  _______________________________________________________  [facility] is using a team-based approach to care.  Your team is made up of your doctor and two to three APPS. Our  APPS (Nurse Practitioners and Physician Assistants) work with your physician to ensure care continuity for you. They are fully qualified to address your health concerns and develop a treatment plan. They communicate directly with your gastroenterologist to care for you. Seeing the Advanced Practice Practitioners on your physician's team can help you by facilitating care more promptly, often allowing for earlier appointments, access to diagnostic testing, procedures, and other specialty referrals.

## 2025-01-25 ENCOUNTER — Ambulatory Visit: Admitting: Podiatry

## 2025-02-22 ENCOUNTER — Ambulatory Visit
